# Patient Record
Sex: Female | Born: 1965 | Race: White | Hispanic: No | Smoking: Never smoker
Health system: Southern US, Community
[De-identification: ages and names within clinical notes are randomized; demographics above are authoritative.]

## PROBLEM LIST (undated history)

## (undated) DIAGNOSIS — Z5189 Encounter for other specified aftercare: Secondary | ICD-10-CM

## (undated) DIAGNOSIS — I10 Essential (primary) hypertension: Secondary | ICD-10-CM

## (undated) DIAGNOSIS — R17 Unspecified jaundice: Secondary | ICD-10-CM

## (undated) DIAGNOSIS — G43909 Migraine, unspecified, not intractable, without status migrainosus: Secondary | ICD-10-CM

## (undated) DIAGNOSIS — F32A Depression, unspecified: Secondary | ICD-10-CM

## (undated) DIAGNOSIS — N390 Urinary tract infection, site not specified: Secondary | ICD-10-CM

## (undated) DIAGNOSIS — IMO0001 Reserved for inherently not codable concepts without codable children: Secondary | ICD-10-CM

## (undated) DIAGNOSIS — F329 Major depressive disorder, single episode, unspecified: Secondary | ICD-10-CM

## (undated) DIAGNOSIS — K219 Gastro-esophageal reflux disease without esophagitis: Secondary | ICD-10-CM

## (undated) DIAGNOSIS — N83209 Unspecified ovarian cyst, unspecified side: Secondary | ICD-10-CM

## (undated) DIAGNOSIS — B159 Hepatitis A without hepatic coma: Secondary | ICD-10-CM

## (undated) HISTORY — DX: Reserved for inherently not codable concepts without codable children: IMO0001

## (undated) HISTORY — DX: Major depressive disorder, single episode, unspecified: F32.9

## (undated) HISTORY — DX: Encounter for other specified aftercare: Z51.89

## (undated) HISTORY — DX: Hepatitis a without hepatic coma: B15.9

## (undated) HISTORY — DX: Essential (primary) hypertension: I10

## (undated) HISTORY — DX: Unspecified jaundice: R17

## (undated) HISTORY — PX: TONSILLECTOMY AND ADENOIDECTOMY: SHX28

## (undated) HISTORY — DX: Depression, unspecified: F32.A

## (undated) HISTORY — DX: Migraine, unspecified, not intractable, without status migrainosus: G43.909

## (undated) HISTORY — PX: OVARIAN CYST REMOVAL: SHX89

## (undated) HISTORY — DX: Urinary tract infection, site not specified: N39.0

## (undated) HISTORY — DX: Gastro-esophageal reflux disease without esophagitis: K21.9

---

## 1992-11-04 HISTORY — PX: APPENDECTOMY: SHX54

## 1997-12-05 ENCOUNTER — Inpatient Hospital Stay (HOSPITAL_COMMUNITY): Admission: AD | Admit: 1997-12-05 | Discharge: 1997-12-07 | Payer: Self-pay | Admitting: Obstetrics and Gynecology

## 1998-07-18 ENCOUNTER — Other Ambulatory Visit: Admission: RE | Admit: 1998-07-18 | Discharge: 1998-07-18 | Payer: Self-pay | Admitting: Obstetrics and Gynecology

## 1998-12-26 ENCOUNTER — Emergency Department (HOSPITAL_COMMUNITY): Admission: EM | Admit: 1998-12-26 | Discharge: 1998-12-26 | Payer: Self-pay | Admitting: Family Medicine

## 1999-07-14 ENCOUNTER — Emergency Department (HOSPITAL_COMMUNITY): Admission: EM | Admit: 1999-07-14 | Discharge: 1999-07-14 | Payer: Self-pay | Admitting: Emergency Medicine

## 1999-07-14 ENCOUNTER — Encounter: Payer: Self-pay | Admitting: Emergency Medicine

## 1999-08-29 ENCOUNTER — Other Ambulatory Visit: Admission: RE | Admit: 1999-08-29 | Discharge: 1999-08-29 | Payer: Self-pay | Admitting: Obstetrics and Gynecology

## 1999-12-26 ENCOUNTER — Inpatient Hospital Stay (HOSPITAL_COMMUNITY): Admission: AD | Admit: 1999-12-26 | Discharge: 1999-12-26 | Payer: Self-pay | Admitting: Obstetrics and Gynecology

## 2000-01-26 ENCOUNTER — Inpatient Hospital Stay (HOSPITAL_COMMUNITY): Admission: EM | Admit: 2000-01-26 | Discharge: 2000-01-27 | Payer: Self-pay

## 2000-03-06 ENCOUNTER — Inpatient Hospital Stay (HOSPITAL_COMMUNITY): Admission: AD | Admit: 2000-03-06 | Discharge: 2000-03-08 | Payer: Self-pay | Admitting: Obstetrics and Gynecology

## 2000-03-21 ENCOUNTER — Inpatient Hospital Stay (HOSPITAL_COMMUNITY): Admission: EM | Admit: 2000-03-21 | Discharge: 2000-03-21 | Payer: Self-pay | Admitting: Obstetrics and Gynecology

## 2000-04-21 ENCOUNTER — Other Ambulatory Visit: Admission: RE | Admit: 2000-04-21 | Discharge: 2000-04-21 | Payer: Self-pay | Admitting: Obstetrics and Gynecology

## 2000-12-16 ENCOUNTER — Inpatient Hospital Stay (HOSPITAL_COMMUNITY): Admission: EM | Admit: 2000-12-16 | Discharge: 2000-12-19 | Payer: Self-pay | Admitting: Internal Medicine

## 2001-08-16 ENCOUNTER — Inpatient Hospital Stay (HOSPITAL_COMMUNITY): Admission: EM | Admit: 2001-08-16 | Discharge: 2001-08-17 | Payer: Self-pay | Admitting: *Deleted

## 2001-12-05 ENCOUNTER — Emergency Department (HOSPITAL_COMMUNITY): Admission: EM | Admit: 2001-12-05 | Discharge: 2001-12-05 | Payer: Self-pay | Admitting: Emergency Medicine

## 2002-03-13 ENCOUNTER — Emergency Department (HOSPITAL_COMMUNITY): Admission: EM | Admit: 2002-03-13 | Discharge: 2002-03-13 | Payer: Self-pay | Admitting: Emergency Medicine

## 2002-12-02 ENCOUNTER — Other Ambulatory Visit (HOSPITAL_COMMUNITY): Admission: RE | Admit: 2002-12-02 | Discharge: 2003-01-03 | Payer: Self-pay | Admitting: Psychiatry

## 2003-04-22 ENCOUNTER — Emergency Department (HOSPITAL_COMMUNITY): Admission: EM | Admit: 2003-04-22 | Discharge: 2003-04-22 | Payer: Self-pay | Admitting: Emergency Medicine

## 2004-05-04 ENCOUNTER — Emergency Department (HOSPITAL_COMMUNITY): Admission: EM | Admit: 2004-05-04 | Discharge: 2004-05-04 | Payer: Self-pay | Admitting: Emergency Medicine

## 2004-06-18 ENCOUNTER — Emergency Department (HOSPITAL_COMMUNITY): Admission: EM | Admit: 2004-06-18 | Discharge: 2004-06-18 | Payer: Self-pay | Admitting: Emergency Medicine

## 2004-08-11 ENCOUNTER — Emergency Department (HOSPITAL_COMMUNITY): Admission: EM | Admit: 2004-08-11 | Discharge: 2004-08-11 | Payer: Self-pay | Admitting: Emergency Medicine

## 2005-06-14 ENCOUNTER — Emergency Department (HOSPITAL_COMMUNITY): Admission: EM | Admit: 2005-06-14 | Discharge: 2005-06-14 | Payer: Self-pay | Admitting: Emergency Medicine

## 2006-09-30 ENCOUNTER — Ambulatory Visit: Payer: Self-pay | Admitting: Internal Medicine

## 2008-09-04 ENCOUNTER — Emergency Department (HOSPITAL_COMMUNITY): Admission: EM | Admit: 2008-09-04 | Discharge: 2008-09-04 | Payer: Self-pay | Admitting: Emergency Medicine

## 2008-09-10 ENCOUNTER — Emergency Department (HOSPITAL_COMMUNITY): Admission: EM | Admit: 2008-09-10 | Discharge: 2008-09-10 | Payer: Self-pay | Admitting: Emergency Medicine

## 2009-01-13 ENCOUNTER — Emergency Department (HOSPITAL_COMMUNITY): Admission: EM | Admit: 2009-01-13 | Discharge: 2009-01-13 | Payer: Self-pay | Admitting: Emergency Medicine

## 2009-03-29 ENCOUNTER — Emergency Department (HOSPITAL_COMMUNITY): Admission: EM | Admit: 2009-03-29 | Discharge: 2009-03-30 | Payer: Self-pay | Admitting: Emergency Medicine

## 2009-05-03 ENCOUNTER — Emergency Department (HOSPITAL_COMMUNITY): Admission: EM | Admit: 2009-05-03 | Discharge: 2009-05-03 | Payer: Self-pay | Admitting: Emergency Medicine

## 2009-05-25 ENCOUNTER — Ambulatory Visit: Payer: Self-pay | Admitting: Family Medicine

## 2009-05-25 DIAGNOSIS — I10 Essential (primary) hypertension: Secondary | ICD-10-CM | POA: Insufficient documentation

## 2009-05-25 DIAGNOSIS — F4323 Adjustment disorder with mixed anxiety and depressed mood: Secondary | ICD-10-CM | POA: Insufficient documentation

## 2009-05-25 DIAGNOSIS — J45909 Unspecified asthma, uncomplicated: Secondary | ICD-10-CM | POA: Insufficient documentation

## 2009-05-25 DIAGNOSIS — K219 Gastro-esophageal reflux disease without esophagitis: Secondary | ICD-10-CM | POA: Insufficient documentation

## 2009-06-24 ENCOUNTER — Telehealth: Payer: Self-pay | Admitting: Family Medicine

## 2009-07-19 ENCOUNTER — Emergency Department (HOSPITAL_COMMUNITY): Admission: EM | Admit: 2009-07-19 | Discharge: 2009-07-19 | Payer: Self-pay | Admitting: Emergency Medicine

## 2009-08-11 ENCOUNTER — Ambulatory Visit: Payer: Self-pay | Admitting: Internal Medicine

## 2009-09-14 ENCOUNTER — Emergency Department (HOSPITAL_COMMUNITY): Admission: EM | Admit: 2009-09-14 | Discharge: 2009-09-14 | Payer: Self-pay | Admitting: Emergency Medicine

## 2009-09-17 ENCOUNTER — Emergency Department (HOSPITAL_COMMUNITY): Admission: EM | Admit: 2009-09-17 | Discharge: 2009-09-18 | Payer: Self-pay | Admitting: Emergency Medicine

## 2009-10-05 ENCOUNTER — Ambulatory Visit: Payer: Self-pay | Admitting: Internal Medicine

## 2009-10-23 ENCOUNTER — Ambulatory Visit: Payer: Self-pay | Admitting: Family Medicine

## 2009-10-23 DIAGNOSIS — F411 Generalized anxiety disorder: Secondary | ICD-10-CM | POA: Insufficient documentation

## 2009-12-14 ENCOUNTER — Ambulatory Visit: Payer: Self-pay | Admitting: Family Medicine

## 2009-12-14 DIAGNOSIS — F988 Other specified behavioral and emotional disorders with onset usually occurring in childhood and adolescence: Secondary | ICD-10-CM | POA: Insufficient documentation

## 2010-01-03 ENCOUNTER — Telehealth: Payer: Self-pay | Admitting: Family Medicine

## 2010-01-29 ENCOUNTER — Ambulatory Visit: Payer: Self-pay | Admitting: Family Medicine

## 2010-01-29 DIAGNOSIS — J309 Allergic rhinitis, unspecified: Secondary | ICD-10-CM | POA: Insufficient documentation

## 2010-02-08 ENCOUNTER — Telehealth: Payer: Self-pay | Admitting: Family Medicine

## 2010-02-13 ENCOUNTER — Encounter: Payer: Self-pay | Admitting: Internal Medicine

## 2010-02-21 ENCOUNTER — Telehealth: Payer: Self-pay | Admitting: *Deleted

## 2010-02-26 ENCOUNTER — Telehealth: Payer: Self-pay | Admitting: Family Medicine

## 2010-04-03 ENCOUNTER — Telehealth: Payer: Self-pay | Admitting: Family Medicine

## 2010-04-05 ENCOUNTER — Ambulatory Visit: Payer: Self-pay | Admitting: Internal Medicine

## 2010-04-05 ENCOUNTER — Inpatient Hospital Stay (HOSPITAL_COMMUNITY): Admission: EM | Admit: 2010-04-05 | Discharge: 2010-04-09 | Payer: Self-pay | Admitting: Emergency Medicine

## 2010-04-05 ENCOUNTER — Encounter: Payer: Self-pay | Admitting: *Deleted

## 2010-04-06 ENCOUNTER — Telehealth: Payer: Self-pay | Admitting: Family Medicine

## 2010-04-09 ENCOUNTER — Telehealth: Payer: Self-pay | Admitting: Internal Medicine

## 2010-04-18 ENCOUNTER — Ambulatory Visit: Payer: Self-pay | Admitting: Family Medicine

## 2010-04-23 ENCOUNTER — Telehealth: Payer: Self-pay | Admitting: Family Medicine

## 2010-04-24 ENCOUNTER — Telehealth: Payer: Self-pay | Admitting: Family Medicine

## 2010-04-24 ENCOUNTER — Telehealth: Payer: Self-pay | Admitting: Internal Medicine

## 2010-05-25 ENCOUNTER — Telehealth: Payer: Self-pay | Admitting: Family Medicine

## 2010-06-03 ENCOUNTER — Emergency Department (HOSPITAL_COMMUNITY): Admission: EM | Admit: 2010-06-03 | Discharge: 2010-06-03 | Payer: Self-pay | Admitting: Emergency Medicine

## 2010-06-21 ENCOUNTER — Telehealth: Payer: Self-pay | Admitting: Family Medicine

## 2010-07-18 ENCOUNTER — Telehealth: Payer: Self-pay | Admitting: Family Medicine

## 2010-07-30 ENCOUNTER — Telehealth: Payer: Self-pay | Admitting: Family Medicine

## 2010-08-08 ENCOUNTER — Telehealth: Payer: Self-pay | Admitting: Family Medicine

## 2010-08-09 ENCOUNTER — Telehealth: Payer: Self-pay | Admitting: Family Medicine

## 2010-08-27 ENCOUNTER — Telehealth: Payer: Self-pay | Admitting: Family Medicine

## 2010-09-07 ENCOUNTER — Telehealth: Payer: Self-pay | Admitting: Family Medicine

## 2010-09-26 ENCOUNTER — Telehealth: Payer: Self-pay | Admitting: Family Medicine

## 2010-10-08 ENCOUNTER — Telehealth: Payer: Self-pay | Admitting: Family Medicine

## 2010-10-23 ENCOUNTER — Telehealth: Payer: Self-pay | Admitting: Family Medicine

## 2010-10-30 ENCOUNTER — Ambulatory Visit
Admission: RE | Admit: 2010-10-30 | Discharge: 2010-10-30 | Payer: Self-pay | Source: Home / Self Care | Attending: Internal Medicine | Admitting: Internal Medicine

## 2010-10-31 ENCOUNTER — Ambulatory Visit: Admit: 2010-10-31 | Payer: Self-pay | Admitting: Family Medicine

## 2010-11-08 ENCOUNTER — Telehealth: Payer: Self-pay | Admitting: Family Medicine

## 2010-11-26 ENCOUNTER — Telehealth: Payer: Self-pay | Admitting: Family Medicine

## 2010-11-29 ENCOUNTER — Telehealth: Payer: Self-pay | Admitting: Family Medicine

## 2010-12-05 ENCOUNTER — Emergency Department (HOSPITAL_COMMUNITY)
Admission: EM | Admit: 2010-12-05 | Discharge: 2010-12-05 | Disposition: A | Discharge status: Home / Self Care | Payer: 59 | Attending: Emergency Medicine | Admitting: Emergency Medicine

## 2010-12-05 DIAGNOSIS — F988 Other specified behavioral and emotional disorders with onset usually occurring in childhood and adolescence: Secondary | ICD-10-CM | POA: Insufficient documentation

## 2010-12-05 DIAGNOSIS — I10 Essential (primary) hypertension: Secondary | ICD-10-CM | POA: Insufficient documentation

## 2010-12-05 DIAGNOSIS — J3489 Other specified disorders of nose and nasal sinuses: Secondary | ICD-10-CM | POA: Insufficient documentation

## 2010-12-05 DIAGNOSIS — J45909 Unspecified asthma, uncomplicated: Secondary | ICD-10-CM | POA: Insufficient documentation

## 2010-12-05 DIAGNOSIS — R040 Epistaxis: Secondary | ICD-10-CM | POA: Insufficient documentation

## 2010-12-06 NOTE — Progress Notes (Signed)
Summary: Pt req reg Adderall 10mg  (NOT XR)  Phone Note Call from Patient Call back at [facility] Phone [phone]   Caller: Patient Complaint: Urinary/GYN Problems Summary of Call: Pt called re: Adderall 10mg   (NOT XR). Pt feels like she needs this now. Please call when script ready for pick up.  Initial call taken by: [name],  February 26, 2010 2:32 PM  Follow-up for Phone Call        pt informed script is ready Follow-up by: Willy Eddy, LPN,  February 27, 2010 7:57 AM    Prescriptions: ADDERALL 10 MG TABS (AMPHETAMINE-DEXTROAMPHETAMINE) 1 once daily IN PM  #30 x 0   Entered by:   Willy Eddy, LPN   Authorized by:   Evelena Peat MD   Signed by:   Willy Eddy, LPN on 91/47/8295   Method used:   Print then Give to Patient   RxID:   [id number] ADDERALL 10 MG TABS (AMPHETAMINE-DEXTROAMPHETAMINE) 1 once daily IN PM  #30 x 0   Entered by:   Willy Eddy, LPN   Authorized by:   Evelena Peat MD   Signed by:   Willy Eddy, LPN on 52/84/1324   Method used:   Print then Give to Patient   RxID:   [id number]

## 2010-12-06 NOTE — Progress Notes (Signed)
Summary: refill  Phone Note Call from Patient Call back at [phone]   Caller: Patient Call For: Franciszek Platten Reason for Call: Talk to Nurse Summary of Call: Patient being d/c from hospital and wanted to make sure rx for [facility]anax [facility] [phone].  Patient stated she spoke w/ dr Rosaire Cueto about this. Initial call taken by: Lehman Prom,  April 09, 2010 12:56 PM  Follow-up for Phone Call        Dr. Maple Hudson, I called the pharmacy and they have not recieved rx for xanax.  It looks like Dr Caryl Never normally refills this for pt.  Please advise if we need to send in rx thanks Follow-up by: Vernie Murders,  April 09, 2010 2:23 PM  Additional Follow-up for Phone Call Additional follow up Details #1::        per CY pt needs to get filled from Dr. Caryl Never. Pt advised. Carron Curie CMA  April 09, 2010 4:56 PM

## 2010-12-06 NOTE — Progress Notes (Signed)
Summary: Pt req script for Adderall XR 25mg   Phone Note Refill Request Call back at Home Phone [phone] Message from:  Patient on November 08, 2010 11:54 AM  Refills Requested: Medication #1:  ADDERALL XR 25 MG XR24H-CAP once daily may fill in one month   Dosage confirmed as above?Dosage Confirmed   Supply Requested: 1 month  Method Requested: Pick up at Office Initial call taken by: [name],  November 08, 2010 11:54 AM  Follow-up for Phone Call        Pt informed ready for pick-up Follow-up by: [name] LPN,  November 08, 2010 5:13 PM    New/Updated Medications: ADDERALL XR 25 MG XR24H-CAP (AMPHETAMINE-DEXTROAMPHETAMINE) one by mouth once daily ADDERALL XR 25 MG XR24H-CAP (AMPHETAMINE-DEXTROAMPHETAMINE) one by mouth once daily may refill in two months Prescriptions: ADDERALL XR 25 MG XR24H-CAP (AMPHETAMINE-DEXTROAMPHETAMINE) one by mouth once daily may refill in two months  #30 x 0   Entered and Authorized by:   Evelena Peat MD   Signed by:   Evelena Peat MD on 11/08/2010   Method used:   Print then Give to Patient   RxID:   [id number] ADDERALL XR 25 MG XR24H-CAP (AMPHETAMINE-DEXTROAMPHETAMINE) one by mouth once daily  #30 x 0   Entered and Authorized by:   Evelena Peat MD   Signed by:   Evelena Peat MD on 11/08/2010   Method used:   Print then Give to Patient   RxID:   [id number] ADDERALL XR 25 MG XR24H-CAP (AMPHETAMINE-DEXTROAMPHETAMINE) once daily may fill in one month  #30 x 0   Entered and Authorized by:   Evelena Peat MD   Signed by:   Evelena Peat MD on 11/08/2010   Method used:   Print then Give to Patient   RxID:   [id number]

## 2010-12-06 NOTE — Progress Notes (Signed)
Summary: Pt req refill of Adderall 10mg   Phone Note Refill Request Call back at Home Phone [phone] Message from:  Patient on September 26, 2010 8:31 AM  Refills Requested: Medication #1:  ADDERALL 10 MG TABS 1 once daily IN PM.   Dosage confirmed as above?Dosage Confirmed   Supply Requested: 1 month  Method Requested: Pick up at Office Initial call taken by: [name],  September 26, 2010 8:31 AM  Follow-up for Phone Call        Adderall 10mg , take one in PM last filled 08/29/10 [name] LPN  September 26, 2010 8:36 AM will refill Follow-up by: Evelena Peat MD,  October 01, 2010 8:30 AM  Additional Follow-up for Phone Call Additional follow up Details #1::        Pt has OV today Additional Follow-up by: [name] LPN,  October 01, 2010 9:10 AM    Prescriptions: ADDERALL 10 MG TABS (AMPHETAMINE-DEXTROAMPHETAMINE) 1 once daily IN PM  #30 x 0   Entered and Authorized by:   Evelena Peat MD   Signed by:   Evelena Peat MD on 10/01/2010   Method used:   Print then Give to Patient   RxID:   [id number]

## 2010-12-06 NOTE — Progress Notes (Signed)
Summary: REFILL REQUEST Adderall XR 25mg   Phone Note Refill Request Message from:  Patient on October 08, 2010 8:17 AM  Refills Requested: Medication #1:  ADDERALL XR 25 MG XR24H-CAP one  tab by mouth daily   Notes: Pt can be reached at [phone] when Rx is ready for p/u.    Initial call taken by: Debbra Riding,  October 08, 2010 8:17 AM  Follow-up for Phone Call        Last filled 11/4, no show 11/28 refill Follow-up by: Evelena Peat MD,  October 09, 2010 12:57 PM  Additional Follow-up for Phone Call Additional follow up Details #1::        Pt informed Rx ready Additional Follow-up by: [name] LPN,  October 09, 2010 2:20 PM    Prescriptions: ADDERALL XR 25 MG XR24H-CAP (AMPHETAMINE-DEXTROAMPHETAMINE) one  tab by mouth daily  #30 x 0   Entered and Authorized by:   Evelena Peat MD   Signed by:   Evelena Peat MD on 10/09/2010   Method used:   Print then Give to Patient   RxID:   [id number]

## 2010-12-06 NOTE — Progress Notes (Signed)
Summary: refill Adderall request, last filled 9/26 #30 only  Phone Note Refill Request Call back at Home Phone [phone] Message from:  Patient---live call  Refills Requested: Medication #1:  ADDERALL 10 MG TABS 1 once daily IN PM. call when ready  Initial call taken by: Warnell Forester,  August 27, 2010 9:02 AM  Follow-up for Phone Call        Last filled 9/26, #30 only [name] LPN  August 27, 2010 12:11 PM will refill  Follow-up by: Evelena Peat MD,  August 29, 2010 1:04 PM  Additional Follow-up for Phone Call Additional follow up Details #1::        Pt informed ready Additional Follow-up by: [name] LPN,  August 29, 2010 1:30 PM    Prescriptions: ADDERALL 10 MG TABS (AMPHETAMINE-DEXTROAMPHETAMINE) 1 once daily IN PM  #30 x 0   Entered and Authorized by:   Evelena Peat MD   Signed by:   Evelena Peat MD on 08/29/2010   Method used:   Print then Give to Patient   RxID:   [id number]

## 2010-12-06 NOTE — Progress Notes (Signed)
Summary: Adderall Refill  Phone Note Refill Request Call back at Home Phone [phone] Message from:  Patient on June 21, 2010 10:22 AM  Refills Requested: Medication #1:  ADDERALL 10 MG TABS 1 once daily IN PM. Due for refill on 06/26/10, was told to call a few days early before running out of medicine.  Initial call taken by: Trixie Dredge,  June 21, 2010 10:21 AM  Follow-up for Phone Call        will refill Follow-up by: Evelena Peat MD,  June 23, 2010 11:55 AM  Additional Follow-up for Phone Call Additional follow up Details #1::        Pt informed on personally identified VM Additional Follow-up by: [name] LPN,  June 25, 2010 8:14 AM    Prescriptions: ADDERALL 10 MG TABS (AMPHETAMINE-DEXTROAMPHETAMINE) 1 once daily IN PM  #30 x 0   Entered and Authorized by:   Evelena Peat MD   Signed by:   Evelena Peat MD on 06/23/2010   Method used:   Print then Give to Patient   RxID:   [id number]

## 2010-12-06 NOTE — Progress Notes (Signed)
Summary: after hours albuterol refill  Phone Note Call from Patient   Caller: Patient Summary of Call: pt reports she is out of albuterol.  [facility].  [phone]. Initial call taken by: Neena Rhymes MD,  June 24, 2009 9:51 AM    Prescriptions: VENTOLIN HFA 108 (90 BASE) MCG/ACT AERS (ALBUTEROL SULFATE) as needed  #1 x 3   Entered and Authorized by:   Neena Rhymes MD   Signed by:   Neena Rhymes MD on 06/24/2009   Method used:   Electronically to        [facility]. [phone]* (retail)       [phone]       [name], [place]  [address]       Ph: [phone]       Fax: [phone]   RxID:   [id number]

## 2010-12-06 NOTE — Progress Notes (Signed)
Summary: Pt hospitalized yesterday, asthma complications  Phone Note Call from Patient   Caller: Patient Call For: Evelena Peat MD Summary of Call: Pt called from Hospital, her asthma condition worsened yesterday and went to ER.  She was admitted and was thold she would be there a few days.  Tried to call yesterday to inform office, called around 5:30pm, no answer.  Apologized for no-show.  Pt will schedule hospital follow-up visit for next week. Initial call taken by: [name] LPN,  April 06, 1609 10:52 AM

## 2010-12-06 NOTE — Miscellaneous (Signed)
Summary: Orders Update pft charges  Clinical Lists Changes  Orders: Added new Service order of Carbon Monoxide diffusing w/capacity (94720) - Signed Added new Service order of Lung Volumes (94240) - Signed Added new Service order of Spirometry (Pre & Post) (94060) - Signed 

## 2010-12-06 NOTE — Assessment & Plan Note (Signed)
Summary: follow up re: asthma and lung inf/cjr   Vital Signs:  Patient profile:   45 year old female Menstrual status:  regular Temp:     98.2 degrees F oral BP sitting:   140 / 90  (left arm) Cuff size:   regular  Vitals Entered By: [name] LPN (April 18, 2010 8:57 AM)  CC: Post hospital visit, asthma   History of Present Illness: Recent hospitalization for asthma exacerbation and viral illness.  No pneumonia. Pt treated with antibiotics, IV steroids, and subsequent pred oral taper. Doing better at this time-at baseline from resp standpoint.  Labs from hosp reviewed and signif for glucose of 139 (?fasting).  No symptoms of hyperglycemia. meds changed from Asmanex to Symbicort.  Pt remains on Spiriva.    Increased anxiety isssues with daughter going to [place] tomorrow.  Requests refills Xanax. On Prozac for hx of depression.   Depression symptoms stable.  D/C Prozac dose listed at 10 mg  but pt should be on 20 mg dose.  needs refills Nexium.  No GERD symptoms on Nexium.  Allergies: 1)  ! Aspirin (Aspirin) 2)  Codeine Sulfate (Codeine Sulfate) 3)  Hydrocodone-Acetaminophen (Hydrocodone-Acetaminophen)  Past History:  Past Medical History: Last updated: 05/25/2009 Depression Hypertension Asthma GERD Hepatitis A Jaundice Blood transfusion Migraines UTI  Review of Systems  The patient denies fever, hoarseness, chest pain, dyspnea on exertion, peripheral edema, prolonged cough, and hemoptysis.    Physical Exam  General:  Well-developed,well-nourished,in no acute distress; alert,appropriate and cooperative throughout examination Ears:  External ear exam shows no significant lesions or deformities.  Otoscopic examination reveals clear canals, tympanic membranes are intact bilaterally without bulging, retraction, inflammation or discharge. Hearing is grossly normal bilaterally. Mouth:  Oral mucosa and oropharynx without lesions or exudates.  Teeth in good  repair. Neck:  No deformities, masses, or tenderness noted. Lungs:  Normal respiratory effort, chest expands symmetrically. Lungs are clear to auscultation, no crackles or wheezes. Heart:  normal rate and regular rhythm.   Extremities:  no edema. Skin:  no rashes.   Cervical Nodes:  No lymphadenopathy noted Psych:  normally interactive, good eye contact, not anxious appearing, and not depressed appearing.     Impression & Recommendations:  Problem # 1:  ASTHMA (ICD-493.90)  Her updated medication list for this problem includes:    Ventolin Hfa 108 (90 Base) Mcg/act Aers (Albuterol sulfate) .Marland Kitchen... As needed    Symbicort 160-4.5 Mcg/act Aero (Budesonide-formoterol fumarate) ..... One puff every morning    Spiriva Handihaler 18 Mcg Caps (Tiotropium bromide monohydrate) .Marland Kitchen... 1 daily    Ipratropium-albuterol 0.5-2.5 (3) Mg/72ml Soln (Ipratropium-albuterol) ..... Inhale contents of 1 viall in nebulizer as needed for asthma  Problem # 2:  GERD (ICD-530.81)  Her updated medication list for this problem includes:    Nexium 40 Mg Cpdr (Esomeprazole magnesium) ..... One by mouth once daily  Problem # 3:  ANXIETY (ICD-300.00)  Her updated medication list for this problem includes:    Fluoxetine Hcl 20 Mg Caps (Fluoxetine hcl) ..... One by mouth once daily    Alprazolam 0.5 Mg Tabs (Alprazolam) ..... One by mouth q 8 hours as needed  Problem # 4:  HYPERTENSION (ICD-401.9)  Her updated medication list for this problem includes:    Hydrochlorothiazide 25 Mg Tabs (Hydrochlorothiazide) ..... One half tablet once daily    Benicar 20 Mg Tabs (Olmesartan medoxomil) ..... One by mouth once daily  Complete Medication List: 1)  Ventolin Hfa 108 (90 Base) Mcg/act Aers (  Albuterol sulfate) .... As needed 2)  Hydrochlorothiazide 25 Mg Tabs (Hydrochlorothiazide) .... One half tablet once daily 3)  Symbicort 160-4.5 Mcg/act Aero (Budesonide-formoterol fumarate) .... One puff every morning 4)  Spiriva  Handihaler 18 Mcg Caps (Tiotropium bromide monohydrate) .Marland Kitchen.. 1 daily 5)  Nexium 40 Mg Cpdr (Esomeprazole magnesium) .... One by mouth once daily 6)  Fluoxetine Hcl 20 Mg Caps (Fluoxetine hcl) .... One by mouth once daily 7)  Alprazolam 0.5 Mg Tabs (Alprazolam) .... One by mouth q 8 hours as needed 8)  Benicar 20 Mg Tabs (Olmesartan medoxomil) .... One by mouth once daily 9)  Adderall Xr 25 Mg Xr24h-cap (Amphetamine-dextroamphetamine) .... One by mouth once daily 10)  Ipratropium-albuterol 0.5-2.5 (3) Mg/90ml Soln (Ipratropium-albuterol) .... Inhale contents of 1 viall in nebulizer as needed for asthma 11)  Astelin 137 Mcg/spray Soln (Azelastine hcl) .Marland Kitchen.. 1-2 sprays per nostril two times a day as needed allergies 12)  Adderall Xr 25 Mg Xr24h-cap (Amphetamine-dextroamphetamine) .... On tab by mouth daily may fill in one month 13)  Adderall Xr 25 Mg Xr24h-cap (Amphetamine-dextroamphetamine) .... One tab by mouth daily may fill in two months 14)  Adderall 10 Mg Tabs (Amphetamine-dextroamphetamine) .Marland Kitchen.. 1 once daily in pm  Patient Instructions: 1)  Increase Fluoxetine to 20 mg per day. 2)  Make sure Benicar dose is 20 mg daily. 3)  Check your  Blood Pressure regularly . If it is above:140/90   you should make an appointment. Prescriptions: NEXIUM 40 MG CPDR (ESOMEPRAZOLE MAGNESIUM) one by mouth once daily  #30 x 6   Entered and Authorized by:   Evelena Peat MD   Signed by:   Evelena Peat MD on 04/18/2010   Method used:   Electronically to        [facility]. [phone]* (retail)       [phone] [address].       [place], [place]  [address]       Ph: [phone]       Fax: [phone]   RxID:   [id number] FLUOXETINE HCL 20 MG CAPS (FLUOXETINE HCL) one by mouth once daily  #30 x 6   Entered and Authorized by:   Evelena Peat MD   Signed by:   Evelena Peat MD on 04/18/2010   Method used:   Electronically to        [facility]. [phone]*  (retail)       [phone] [address].       [place], [place]  [address]       Ph: [phone]       Fax: [phone]   RxID:   [id number] ALPRAZOLAM 0.5 MG TABS (ALPRAZOLAM) one by mouth q 8 hours as needed  #60 x 0   Entered and Authorized by:   Evelena Peat MD   Signed by:   Evelena Peat MD on 04/18/2010   Method used:   Print then Give to Patient   RxID:   [id number]

## 2010-12-06 NOTE — Assessment & Plan Note (Signed)
Summary: asthma problems/cjr   Vital Signs:  Patient profile:   45 year old female Menstrual status:  regular Weight:      150 pounds BMI:     25.05 O2 Sat:      96 % Pulse rate:   81 / minute BP sitting:   150 / 98  (left arm)  Vitals Entered By: Kyung Rudd, CMA (October 30, 2010 11:59 AM) CC: asthma issues    Primary Care Provider:  [facility]  CC:  asthma issues .  History of Present Illness: Patient presents to clinic as a workin for evaluation of cough. Notes 7d h/o cough NP with subjective wheezing. Denies f/c, cp or dyspnea. H/o asthma and states recently began prednisone taper (has at home.) H/o ADD on adderall and feels less focus and lethargy. States previously on higher dose without adverse effect and requests increase.  Current Medications (verified): 1)  Ventolin Hfa 108 (90 Base) Mcg/act Aers (Albuterol Sulfate) .... As Needed 2)  Hydrochlorothiazide 25 Mg Tabs (Hydrochlorothiazide) .... One Half Tablet Once Daily 3)  Symbicort 160-4.5 Mcg/act Aero (Budesonide-Formoterol Fumarate) .... One Puff Every Morning 4)  Spiriva Handihaler 18 Mcg Caps (Tiotropium Bromide Monohydrate) .Marland Kitchen.. 1 Daily 5)  Omeprazole 20 Mg Tbec (Omeprazole) .... One Tab  Daily 6)  Fluoxetine Hcl 20 Mg Caps (Fluoxetine Hcl) .... One By Mouth Once Daily 7)  Alprazolam 0.5 Mg Tabs (Alprazolam) .... One By Mouth Q 8 Hours As Needed 8)  Benicar 20 Mg Tabs (Olmesartan Medoxomil) .... One By Mouth Once Daily 9)  Ipratropium-Albuterol 0.5-2.5 (3) Mg/9ml Soln (Ipratropium-Albuterol) .... Inhale Contents of 1 Viall in Nebulizer As Needed For Asthma 10)  Astelin 137 Mcg/spray Soln (Azelastine Hcl) .Marland Kitchen.. 1-2 Sprays Per Nostril Two Times A Day As Needed Allergies 11)  Adderall Xr 25 Mg Xr24h-Cap (Amphetamine-Dextroamphetamine) .... Once Daily May Fill in One Month  Allergies (verified): 1)  ! Aspirin (Aspirin) 2)  Codeine Sulfate (Codeine Sulfate) 3)  Hydrocodone-Acetaminophen  (Hydrocodone-Acetaminophen)  Past History:  Past medical, surgical, family and social histories (including risk factors) reviewed, and no changes noted (except as noted below).  Past Medical History: Reviewed history from 05/25/2009 and no changes required. Depression Hypertension Asthma GERD Hepatitis A Jaundice Blood transfusion Migraines UTI  Past Surgical History: Reviewed history from 05/25/2009 and no changes required. Appendectomy 1994 Tonsillectomy  1973  Family History: Reviewed history from 08/11/2009 and no changes required. Family History of Alcoholism/Addiction, parent, blood relative Family History Ovarian cancer, grandmother Family history breast cancer, grandmother Family History Hypertension Family history emotional illness Family history diabetes, blood relative Son with asthma and nut allergy  Social History: Reviewed history from 08/11/2009 and no changes required. 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   Review of Systems      See HPI  Physical Exam  General:  Well-developed,well-nourished,in no acute distress; alert,appropriate and cooperative throughout examination Head:  Normocephalic and atraumatic without obvious abnormalities. No apparent alopecia or balding. Eyes:  pupils equal, pupils round, corneas and lenses clear, and no injection.   Ears:  R ear normal, L ear normal, and no external deformities.   Nose:  no external deformity, no nasal discharge, and no mucosal edema.   Mouth:  Oral mucosa and oropharynx without lesions or exudates.  Teeth in good repair. Neck:  No deformities, masses, or tenderness noted. Lungs:  Normal respiratory effort, chest expands symmetrically. Lungs are clear to auscultation, no crackles or wheezes.no intercostal retractions and no accessory muscle  use.   Heart:  Normal rate and regular rhythm. S1 and S2 normal without gallop, murmur, click, rub or other extra  sounds. Skin:  turgor normal, color normal, and no rashes.     Impression & Recommendations:  Problem # 1:  ASTHMA (ICD-493.90) Assessment Deteriorated Asthma exacerbation without resp distress.  Suspect URI trigger. Begin abx tx, prednisone taper(instructions provided). Followup if no improvement or worsening.  Her updated medication list for this problem includes:    Ventolin Hfa 108 (90 Base) Mcg/act Aers (Albuterol sulfate) .Marland Kitchen... As needed    Symbicort 160-4.5 Mcg/act Aero (Budesonide-formoterol fumarate) ..... One puff every morning    Spiriva Handihaler 18 Mcg Caps (Tiotropium bromide monohydrate) .Marland Kitchen... 1 daily    Ipratropium-albuterol 0.5-2.5 (3) Mg/54ml Soln (Ipratropium-albuterol) ..... 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 Further dose adjustments per pmd and f/u appt made to review.  Complete Medication List: 1)  Ventolin Hfa 108 (90 Base) Mcg/act Aers (Albuterol sulfate) .... As needed 2)  Hydrochlorothiazide 25 Mg Tabs (Hydrochlorothiazide) .... One half tablet once daily 3)  Symbicort 160-4.5 Mcg/act Aero (Budesonide-formoterol fumarate) .... One puff every morning 4)  Spiriva Handihaler 18 Mcg Caps (Tiotropium bromide monohydrate) .Marland Kitchen.. 1 daily 5)  Omeprazole 20 Mg Tbec (Omeprazole) .... One tab  daily 6)  Fluoxetine Hcl 20 Mg Caps (Fluoxetine hcl) .... One by mouth once daily 7)  Alprazolam 0.5 Mg Tabs (Alprazolam) .... One by mouth q 8 hours as needed 8)  Benicar 20 Mg Tabs (Olmesartan medoxomil) .... One by mouth once daily 9)  Ipratropium-albuterol 0.5-2.5 (3) Mg/70ml Soln (Ipratropium-albuterol) .... Inhale contents of 1 viall in nebulizer as needed for asthma 10)  Astelin 137 Mcg/spray Soln (Azelastine hcl) .Marland Kitchen.. 1-2 sprays per nostril two times a day as needed allergies 11)  Adderall Xr 25 Mg Xr24h-cap  (Amphetamine-dextroamphetamine) .... Once daily may fill in one month 12)  Adderall 20 Mg Tabs (Amphetamine-dextroamphetamine) .... One by mouth q pm 13)  Levaquin 500 Mg Tabs (Levofloxacin) .... One by mouth qd  Patient Instructions: 1)  Followup with Dr. Caryl Never in 1-2 months Prescriptions: LEVAQUIN 500 MG TABS (LEVOFLOXACIN) one by mouth qd  #7 x 0   Entered and Authorized by:   Edwyna Perfect MD   Signed by:   Edwyna Perfect MD on 10/30/2010   Method used:   Print then Give to Patient   RxID:   [id number] ADDERALL 20 MG TABS (AMPHETAMINE-DEXTROAMPHETAMINE) one by mouth q pm  #30 x 0   Entered and Authorized by:   Edwyna Perfect MD   Signed by:   Edwyna Perfect MD on 10/30/2010   Method used:   Print then Give to Patient   RxID:   [id number]    Orders Added: 1)  Est. Patient Level IV [63016]

## 2010-12-06 NOTE — Assessment & Plan Note (Signed)
Summary: fu per pt/njr   Vital Signs:  Patient profile:   45 year old female Temp:     98.7 degrees F oral BP sitting:   158 / 98  (left arm) Cuff size:   regular  Vitals Entered By: [name] LPN (October 23, 2009 4:22 PM) CC: Anxiety concerns, Hypertension Management   History of Present Illness: Patient here to discuss the following items  She has history of asthma and frequent, almost daily episodes of dyspnea and had recent pulmonary followup. Reportedly her pulmonary function tests came back relatively normal. It appears that she has a definite component of anxiety. She's had multiple situational stressors over the past year with being a single parent, financial issues, and work stress. They seem to exacerbate somewhat. On the other hand she has episodes of anxiety and dyspnea that come on unprovoked.  She continues to use Spiriva and Asmanex regularly.  multiple ER visits over past year for dyspnea.  History of ADD and had been on Adderall but that seemed to exacerbate her anxiety symptoms. She is off that. Has been on Wellbutrin XL per another physician but that does not seem to be helping her ADD symptoms. She denies any significant depression issues. Has previously been on Prozac for anxiety symptoms and that seemed to help. Has used alprazolam for short-term use.  Other issue is elevated blood pressure. Currently on HCTZ 25 mg one half tablet daily.  Has had some weight gain which may be exacerbating. No alcohol use. Nonsmoker. No regular exercise.  Hypertension History:      She denies headache, chest pain, palpitations, orthopnea, peripheral edema, and syncope.        Positive major cardiovascular risk factors include hypertension.  Negative major cardiovascular risk factors include female age less than 66 years old and non-tobacco-user status.     Allergies: 1)  ! 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   Review of Systems       The patient complains of weight gain.  The patient denies anorexia, fever, weight loss, chest pain, syncope, dyspnea on exertion, peripheral edema, prolonged cough, and headaches.    Physical Exam  General:  Well-developed,well-nourished,in no acute distress; alert,appropriate and cooperative throughout examination Ears:  External ear exam shows no significant lesions or deformities.  Otoscopic examination reveals clear canals, tympanic membranes are intact bilaterally without bulging, retraction, inflammation or discharge. Hearing is grossly normal bilaterally. Mouth:  Oral mucosa and oropharynx without lesions or exudates.  Teeth in good repair. Neck:  No deformities, masses, or tenderness noted. Lungs:  Normal respiratory effort, chest expands symmetrically. Lungs are clear to auscultation, no crackles or wheezes. Heart:  Normal rate and regular rhythm. S1 and S2 normal without gallop, murmur, click, rub or other extra sounds. Extremities:  no peripheral edema   Impression & Recommendations:  Problem # 1:  HYPERTENSION (ICD-401.9) Assessment Deteriorated lifestyle modification discussed.  Work on weight loss.  Add Benicar 20 mg once daily and reassess BP one month. Her updated medication list for this problem includes:    Hydrochlorothiazide 25 Mg Tabs (Hydrochlorothiazide) ..... One half tablet once daily    Benicar 20 Mg Tabs (Olmesartan medoxomil) ..... One by mouth once daily  Problem # 2:  ANXIETY (ICD-300.00) Assessment: Deteriorated No evidence for signifi. depression.  ?component of panic disorder. d/c wellbutrin.  Start fluoxetine and as  needed short term alprazolam.  Reassess in one month. The following medications were removed from the medication list:    Wellbutrin Xl 150 Mg Xr24h-tab (Bupropion hcl) ..... Bid Her updated medication list for this problem includes:    Fluoxetine Hcl 20 Mg Caps (Fluoxetine hcl) ..... One by mouth once daily    Alprazolam 0.5 Mg Tabs (Alprazolam) ..... One by mouth q 8 hours as needed  Complete Medication List: 1)  Ventolin Hfa 108 (90 Base) Mcg/act Aers (Albuterol sulfate) .... As needed 2)  Hydrochlorothiazide 25 Mg Tabs (Hydrochlorothiazide) .... One half tablet once daily 3)  Asmanex 30 Metered Doses 220 Mcg/inh Aepb (Mometasone furoate) .... Two times a day 4)  Xopenex Hfa 45 Mcg/act Aero (Levalbuterol tartrate) .... 2 puffs four times a day as needed rescue 5)  Spiriva Handihaler 18 Mcg Caps (Tiotropium bromide monohydrate) .Marland Kitchen.. 1 daily 6)  Otc Reflux Meds  .... Once daily as needed 7)  Fluoxetine Hcl 20 Mg Caps (Fluoxetine hcl) .... One by mouth once daily 8)  Alprazolam 0.5 Mg Tabs (Alprazolam) .... One by mouth q 8 hours as needed 9)  Benicar 20 Mg Tabs (Olmesartan medoxomil) .... One by mouth once daily  Hypertension Assessment/Plan:      The patient's hypertensive risk group is category A: No risk factors and no target organ damage.  Today's blood pressure is 158/98.    Patient Instructions: 1)  Taper off Wellbutrin. Consider one tablet daily for one week then discontinue. 2)  Start Benicar 20 mg one tablet daily. Continue hydrochlorothiazide 25 mg one half tablet daily 3)  Please schedule a follow-up appointment in 1 month.  Prescriptions: ALPRAZOLAM 0.5 MG TABS (ALPRAZOLAM) one by mouth q 8 hours as needed  #60 x 0   Entered and Authorized by:   Evelena Peat MD   Signed by:   Evelena Peat MD on 10/23/2009   Method used:   Print then Give to Patient   RxID:   [id number] FLUOXETINE HCL 20 MG CAPS (FLUOXETINE HCL) one by mouth once daily  #30 x 6   Entered and  Authorized by:   Evelena Peat MD   Signed by:   Evelena Peat MD on 10/23/2009   Method used:   Electronically to        [facility]. [phone]* (retail)       [phone]       [place], [place]  [address]       Ph: [phone]       Fax: [phone]   RxID:   [id number]

## 2010-12-06 NOTE — Progress Notes (Signed)
Summary: Pt req script for Adderall XR 25mg   Phone Note Refill Request Call back at [phone] cell Message from:  Patient on September 07, 2010 10:21 AM  Refills Requested: Medication #1:  ADDERALL XR 25 MG XR24H-CAP one  tab by mouth daily   Dosage confirmed as above?Dosage Confirmed  Method Requested: Pick up at Office Initial call taken by: [name],  September 07, 2010 10:21 AM  Follow-up for Phone Call        Last filled on 08/08/2010, #30, 0 refills [name] Falcon LPN  September 07, 2010 12:08 PM OK to refill Follow-up by: Evelena Peat MD,  September 07, 2010 1:21 PM  Additional Follow-up for Phone Call Additional follow up Details #1::        Pt informed Additional Follow-up by: [name] Falcon LPN,  September 07, 2010 1:28 PM    Prescriptions: ADDERALL XR 25 MG XR24H-CAP (AMPHETAMINE-DEXTROAMPHETAMINE) one  tab by mouth daily  #30 x 0   Entered and Authorized by:   Evelena Peat MD   Signed by:   Evelena Peat MD on 09/07/2010   Method used:   Print then Give to Patient   RxID:   [id number]

## 2010-12-06 NOTE — Progress Notes (Signed)
Summary: Pt req script for Adderall XR 20mg   Phone Note Call from Patient Call back at Home Phone [phone]   Caller: Patient Call For: Evelena Peat MD Complaint: Breathing Problems Summary of Call: Pt req script for Adderall XR 20mg .   Initial call taken by: [name],  January 03, 2010 11:45 AM  Follow-up for Phone Call        Last filled 12/14/2009, pt informed on VM too early to fill.  Will fill on 3/10. [name] LPN  January 04, 8468 12:02 PM refilled. Follow-up by: Evelena Peat MD,  January 11, 2010 9:36 AM  Additional Follow-up for Phone Call Additional follow up Details #1::        Message left Rx ready for pick-up on home phone Additional Follow-up by: [name] LPN,  January 11, 2010 9:44 AM    Prescriptions: ADDERALL XR 25 MG XR24H-CAP (AMPHETAMINE-DEXTROAMPHETAMINE) one by mouth once daily  #30 x 0   Entered and Authorized by:   Evelena Peat MD   Signed by:   Evelena Peat MD on 01/11/2010   Method used:   Print then Give to Patient   RxID:   [id number]

## 2010-12-06 NOTE — Assessment & Plan Note (Signed)
Summary: new to [place]//ccm   Vital Signs:  Patient profile:   45 year old female Height:      65 inches Weight:      146 pounds BMI:     24.38 Temp:     98.2 degrees F oral Pulse rate:   80 / minute Pulse rhythm:   regular Resp:     12 per minute BP sitting:   150 / 102  (left arm)  Vitals Entered By: [name] LPN (May 25, 2009 11:33 AM)  Serial Vital Signs/Assessments:  Time      Position  BP       Pulse  Resp  Temp     By                     178/105                        Evelena Peat MD  CC: Previous [place] pt, to establish, BP running high   History of Present Illness: Patient is a new to establish care. She has long history of asthma and is followed by pulmonologist. She has recently had problems with elevated blood pressure over past several months. At one point the past she was briefly treated with HCTZ. She has a home blood pressure monitor and has had several readings 150-170 systolic and 100-110 diastolic. She's not had any headaches. Has had some fatigue issues. No alcohol use. Nonsmoker. No recent nonsteroidal use. Family history positive for hypertension both parents.  She recalls at one point in past on Benicar and apparently also been on HCTZ at one point. Does not recall any side effects with either.  Other issue is that she has severe situational stressors. She is a single parent. Her kids are getting ready to go out of the country with her ex-husband and she is requesting refill of Xanax which she's used very sparingly in the past. No history of misuse.   Allergies (verified): 1)  ! Aspirin (Aspirin) 2)  Codeine Sulfate (Codeine Sulfate) 3)  Hydrocodone-Acetaminophen (Hydrocodone-Acetaminophen)  Past History:  Past Medical History: Depression Hypertension Asthma GERD Hepatitis A Jaundice Blood transfusion Migraines UTI  Past Surgical History: Appendectomy 1994 Tonsillectomy  1973  Family History: Family History of  Alcoholism/Addiction, parent, blood relatine Family History Ovarian cancer, grandmother Family history breast cancer, grandmother Family History Hypertension Family history emotional illness Family history diabetes, blood relative  Social History: Occupation:  Chief Financial Officer Divorced Occupation:  employed  Review of Systems       Frequently feels anxious. Asthmas been stable. Denies any recent headaches, diplopia, dizziness, chest pains, syncope. She's had some mild peripheral edema.  Physical Exam  General:  Well-developed,well-nourished,in no acute distress; alert,appropriate and cooperative throughout examination Mouth:  Oral mucosa and oropharynx without lesions or exudates.  Teeth in good repair. Neck:  No deformities, masses, or tenderness noted. Lungs:  Normal respiratory effort, chest expands symmetrically. Lungs are clear to auscultation, no crackles or wheezes. Heart:  Normal rate and regular rhythm. S1 and S2 normal without gallop, murmur, click, rub or other extra sounds. Extremities:  No clubbing, cyanosis, edema, or deformity noted with normal full range of motion of all joints.     Impression & Recommendations:  Problem # 1:  HYPERTENSION (ICD-401.9)  Currently untreated. Discussed lifestyle issues with weight loss and exercise. Start HCTZ 12.5 mg daily and blood pressure followup within one month.  Her updated medication list for this problem  includes:    Hydrochlorothiazide 25 Mg Tabs (Hydrochlorothiazide) ..... One half tablet once daily  Problem # 2:  ANXIETY, SITUATIONAL (ICD-308.3) refilled Xanax for sparing use.  Complete Medication List: 1)  Wellbutrin Xl 150 Mg Xr24h-tab (Bupropion hcl) .... Bid 2)  Ventolin Hfa 108 (90 Base) Mcg/act Aers (Albuterol sulfate) .... As needed 3)  Alprazolam 0.5 Mg Tabs (Alprazolam) .... One by mouth q 8 hours as needed 4)  Hydrochlorothiazide 25 Mg Tabs (Hydrochlorothiazide) .... One half tablet once daily 5)  Spiriva  Handihaler 18 Mcg Caps (Tiotropium bromide monohydrate) .... Once daily 6)  Asmanex 30 Metered Doses 220 Mcg/inh Aepb (Mometasone furoate) .... Two times a day  Patient Instructions: 1)  Please schedule a follow-up appointment in 1 month.  2)  It is important that you exercise reguarly at least 20 minutes 5 times a week. If you develop chest pain, have severe difficulty breathing, or feel very tired, stop exercising immediately and seek medical attention.  3)  You need to lose weight. Consider a lower calorie diet and regular exercise.  4)  Eat more potassium rich foods such as bananas, oranje juice, and salt substitutes .  Prescriptions: HYDROCHLOROTHIAZIDE 25 MG TABS (HYDROCHLOROTHIAZIDE) one half tablet once daily  #30 x 11   Entered and Authorized by:   Evelena Peat MD   Signed by:   Evelena Peat MD on 05/25/2009   Method used:   Electronically to        [facility]. [phone]* (retail)       [phone]       [place], [place]  [address]       Ph: [phone]       Fax: [phone]   RxID:   [id number] ALPRAZOLAM 0.5 MG TABS (ALPRAZOLAM) one by mouth q 8 hours as needed  #60 x 0   Entered and Authorized by:   Evelena Peat MD   Signed by:   Evelena Peat MD on 05/25/2009   Method used:   Print then Give to Patient   RxID:   [id number]

## 2010-12-06 NOTE — Progress Notes (Signed)
Summary: Adderall dose update  Phone Note Call from Patient Call back at Home Phone [phone]   Caller: Patient Reason for Call: Privacy/Consent Authorization Summary of Call: Pt called re: Adderall XR 25mg  once daily. Pt says that she needs this adjusted to 25mg  and a 10mg  in afternoon? Pt says that this is the way she used to take this med.  Initial call taken by: [name],  February 21, 2010 1:30 PM  Follow-up for Phone Call        Spoke with pt, informed her Dr Caryl Never out of office the next few days.  No problem, this is more of an FYI as they had discussed at last OV.  Next refills not due until May 10 Follow-up by: [name] LPN,  February 21, 2010 1:56 PM  Additional Follow-up for Phone Call Additional follow up Details #1::        We can add 10mg  of regular (non extended release) at time of next refill unless she needs sooner. Additional Follow-up by: Evelena Peat MD,  February 25, 2010 9:59 PM    Additional Follow-up for Phone Call Additional follow up Details #2::    Called patient and left message on machine FOR PT-WILL CALL IF SHE WANTS 10 MG EARLIER THAN NEXT REFILL Follow-up by: Willy Eddy, LPN,  February 26, 2010 9:27 AM  New/Updated Medications: ADDERALL 10 MG TABS (AMPHETAMINE-DEXTROAMPHETAMINE) 1 once daily IN PM

## 2010-12-06 NOTE — Progress Notes (Signed)
Summary: REQ FOR REFILL (Adderall)  Phone Note Call from Patient   Caller: Patient  [phone] Reason for Call: Refill Medication, Talk to Nurse Summary of Call: Pt called to speak with Harriett Sine, LPN.... Pt wanted to req a refill on med: ADDERALL XR 25 MG .... Pt adv that the refill date is on Sunday, 02/11/2010 but the pt has to leave on Friday evening to go out of town for training would like to p/u Rx before she leaves to go oot (even if Rx shows that it can't be filled till Sunday, 02/11/2010) ....... Pt can be reached at [phone] with any questions or concerns .... to adv when same is ready for p/u.  Initial call taken by: Debbra Riding,  February 08, 2010 1:27 PM  Follow-up for Phone Call        OK to refill. Follow-up by: Evelena Peat MD,  February 09, 2010 8:31 AM  Additional Follow-up for Phone Call Additional follow up Details #1::        Pt informed on VM 3 RX ready for pick-up Additional Follow-up by: [name] LPN,  February 09, 2010 9:09 AM    New/Updated Medications: ADDERALL XR 25 MG XR24H-CAP (AMPHETAMINE-DEXTROAMPHETAMINE) on tab by mouth daily May fill in one month ADDERALL XR 25 MG XR24H-CAP (AMPHETAMINE-DEXTROAMPHETAMINE) one tab by mouth daily May fill in two months Prescriptions: ADDERALL XR 25 MG XR24H-CAP (AMPHETAMINE-DEXTROAMPHETAMINE) one tab by mouth daily May fill in two months  #30 x 0   Entered by:   [name] LPN   Authorized by:   Evelena Peat MD   Signed by:   [name] LPN on 08/65/7846   Method used:   Print then Give to Patient   RxID:   [id number] ADDERALL XR 25 MG XR24H-CAP (AMPHETAMINE-DEXTROAMPHETAMINE) on tab by mouth daily May fill in one month  #30 x 0   Entered by:   [name] LPN   Authorized by:   Evelena Peat MD   Signed by:   [name] LPN on 27/25/3664   Method used:   Print then Give to Patient   RxID:   [id number] ADDERALL XR 25 MG XR24H-CAP (AMPHETAMINE-DEXTROAMPHETAMINE) one by mouth once  daily  #30 x 0   Entered and Authorized by:   Evelena Peat MD   Signed by:   Evelena Peat MD on 02/09/2010   Method used:   Print then Give to Patient   RxID:   [id number]

## 2010-12-06 NOTE — Assessment & Plan Note (Signed)
Summary: rov/apc   Primary Provider/Referring Provider:  Burchette  CC:  follow up visit.  History of Present Illness:  August 11, 2009 Asthma increased SOB; hands numb. hx asthma since age 45. Off and on x 3 years episodes of dyspnea with panic attacks. Feels well in betweeen episodes. Gets frantic with hands tingling. She's tired of being told it's anxiety. [name] 3 weeks ago dx'd asthma. Prednisone helped. Usually she doesn't wheeze, but can't breathe deeply. Well in between episodes which now happen daily and last 30-60 minutes, resolving if she focuses on something else. uses inhaler but doesn't expect it to help. EKG reported normal. Worse if stressed and now struggling with divorce, finances, single parent. Admits she's scared. Weather changes, exposures may make her tight but "different". No longer has PF meter.  October 05, 2009- Asthma, ? panic/ anxiety?, GERD ER twice- once for asthma, then back with severe reflux after they gave prednisone. She is treating this otc and doing better. Has PF meter- not used. Has started walking regularly. Daily feels tightest on waking. Persistent cough and daily mucus- white sticky mucus. We discussed her previous lack of success with psychologist for her anxiety depression. She still is struggling with divorce/ child issues. Spiriva does help. Had flu vax.  Reviewed PFT- reassured her it was normal- not COPD which she was focused on.    Current Medications (verified): 1)  Wellbutrin Xl 150 Mg Xr24h-Tab (Bupropion Hcl) .... Bid 2)  Ventolin Hfa 108 (90 Base) Mcg/act Aers (Albuterol Sulfate) .... As Needed 3)  Hydrochlorothiazide 25 Mg Tabs (Hydrochlorothiazide) .... One Half Tablet Once Daily 4)  Asmanex 30 Metered Doses 220 Mcg/inh Aepb (Mometasone Furoate) .... Two Times A Day 5)  Xopenex Hfa 45 Mcg/act Aero (Levalbuterol Tartrate) .... 2 Puffs Four Times A Day As Needed Rescue 6)  Spiriva Handihaler 18 Mcg Caps (Tiotropium Bromide  Monohydrate) .Marland Kitchen.. 1 Daily 7)  Otc Reflux Meds .... Once Daily As Needed  Allergies (verified): 1)  ! Aspirin (Aspirin) 2)  Codeine Sulfate (Codeine Sulfate) 3)  Hydrocodone-Acetaminophen (Hydrocodone-Acetaminophen)  Past History:  Past Medical History: Last updated: 05/25/2009 Depression Hypertension Asthma GERD Hepatitis A Jaundice Blood transfusion Migraines UTI  Past Surgical History: Last updated: 05/25/2009 Appendectomy 1994 Tonsillectomy  1973  Family History: Last updated: 08/11/2009 Family History of Alcoholism/Addiction, parent, blood relative Family History Ovarian cancer, grandmother Family history breast cancer, grandmother Family History Hypertension Family history emotional illness Family history diabetes, blood relative Son with asthma and nut allergy  Social History: Last updated: 08/11/2009 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   Risk Factors: Smoking Status: never (08/11/2009)  Review of Systems      See HPI  The patient denies anorexia, fever, weight loss, weight gain, vision loss, decreased hearing, hoarseness, chest pain, syncope, dyspnea on exertion, peripheral edema, prolonged cough, headaches, hemoptysis, abdominal pain, and severe indigestion/heartburn.    Vital Signs:  Patient profile:   45 year old female Height:      65 inches O2 Sat:      98 % on Room air Pulse rate:   103 / minute BP sitting:   164 / 98  (left arm) Cuff size:   regular  Vitals Entered By: [name] CMA (October 05, 2009 2:56 PM)  O2 Flow:  Room air  Physical Exam  Additional Exam:  General: A/Ox3; pleasant and cooperative, NAD,talkative, pressured SKIN: no rash, lesions NODES: no lymphadenopathy HEENT: [place]/AT, EOM- WNL, Conjuctivae-  clear, PERRLA, TM-WNL, Nose- clear, Throat- clear and wnl, [place] II NECK: Supple w/ fair ROM, JVD- none, normal carotid impulses w/o bruits Thyroid-  CHEST: Clear to  P&A HEART: RRR, no m/g/r heard ABDOMEN: Soft and nl; ZOX:WRUE, nl pulses, no edema  NEURO: Grossly intact to observation      Impression & Recommendations:  Problem # 1:  ASTHMA (ICD-493.90) Anxiety and panic are probably most of her dyspnea, rather than true asthma. We will leave her with less stimulating types of meds and some reassurance. i asked her again to find her peakflow meter and remind herself what her baseline is for use when dyspneic. She may benefit from an anxiolytic and she definitely needs ongoing help with her GERD.  Medications Added to Medication List This Visit: 1)  Otc Reflux Meds  .... Once daily as needed  Other Orders: Est. Patient Level III (45409)   Patient Instructions: 1)  Schedule return in one year, earlier if needed 2)  Don't forget to get out your peak flow meter. Use it now while you are feeling well, to remind  yourself what your good scores look like. Then use it when you don't feel well so you have numbers you can use in talking with your doctors.

## 2010-12-06 NOTE — Assessment & Plan Note (Signed)
Summary: ?cold or asthma flare up/njr   Vital Signs:  Patient profile:   45 year old female Menstrual status:  regular LMP:     01/27/2010 Weight:      150 pounds O2 Sat:      98 % on Room air Temp:     98.4 degrees F oral Pulse rate:   78 / minute BP sitting:   130 / 90  (left arm) Cuff size:   regular  Vitals Entered By: Romualdo Bolk, CMA (AAMA) (February 13, 2010 3:18 PM)  O2 Flow:  Room air CC: Coughing, runny nose, congestion, wheezing, sob that started 4/10. Pt had a nebulizer tx around 2pm today. LMP (date): 01/27/2010     Menstrual Status regular Enter LMP: 01/27/2010   Preventive Screening-Counseling & Management  Alcohol-Tobacco     Alcohol drinks/day: 0     Smoking Status: never  Caffeine-Diet-Exercise     Caffeine use/day: less than 1 a day     Does Patient Exercise: no  Current Medications (verified): 1)  Ventolin Hfa 108 (90 Base) Mcg/act Aers (Albuterol Sulfate) .... As Needed 2)  Hydrochlorothiazide 25 Mg Tabs (Hydrochlorothiazide) .... One Half Tablet Once Daily 3)  Asmanex 30 Metered Doses 220 Mcg/inh Aepb (Mometasone Furoate) .... Two Times A Day 4)  Spiriva Handihaler 18 Mcg Caps (Tiotropium Bromide Monohydrate) .Marland Kitchen.. 1 Daily 5)  Nexium 40 Mg Cpdr (Esomeprazole Magnesium) 6)  Fluoxetine Hcl 20 Mg Caps (Fluoxetine Hcl) .... One By Mouth Once Daily 7)  Alprazolam 0.5 Mg Tabs (Alprazolam) .... One By Mouth Q 8 Hours As Needed 8)  Benicar 20 Mg Tabs (Olmesartan Medoxomil) .... One By Mouth Once Daily 9)  Adderall Xr 25 Mg Xr24h-Cap (Amphetamine-Dextroamphetamine) .... One By Mouth Once Daily 10)  Ipratropium-Albuterol 0.5-2.5 (3) Mg/7ml Soln (Ipratropium-Albuterol) .... Inhale Contents of 1 Viall in Nebulizer As Needed For Asthma 11)  Astelin 137 Mcg/spray Soln (Azelastine Hcl) .Marland Kitchen.. 1-2 Sprays Per Nostril Two Times A Day As Needed Allergies 12)  Adderall Xr 25 Mg Xr24h-Cap (Amphetamine-Dextroamphetamine) .... On Tab By Mouth Daily May Fill in One  Month 13)  Adderall Xr 25 Mg Xr24h-Cap (Amphetamine-Dextroamphetamine) .... One Tab By Mouth Daily May Fill in Two Months  Allergies (verified): 1)  ! Aspirin (Aspirin) 2)  Codeine Sulfate (Codeine Sulfate) 3)  Hydrocodone-Acetaminophen (Hydrocodone-Acetaminophen)  Social History: Caffeine use/day:  less than 1 a day Does Patient Exercise:  no   Complete Medication List: 1)  Ventolin Hfa 108 (90 Base) Mcg/act Aers (Albuterol sulfate) .... As needed 2)  Hydrochlorothiazide 25 Mg Tabs (Hydrochlorothiazide) .... One half tablet once daily 3)  Asmanex 30 Metered Doses 220 Mcg/inh Aepb (Mometasone furoate) .... Two times a day 4)  Spiriva Handihaler 18 Mcg Caps (Tiotropium bromide monohydrate) .Marland Kitchen.. 1 daily 5)  Nexium 40 Mg Cpdr (Esomeprazole magnesium) 6)  Fluoxetine Hcl 20 Mg Caps (Fluoxetine hcl) .... One by mouth once daily 7)  Alprazolam 0.5 Mg Tabs (Alprazolam) .... One by mouth q 8 hours as needed 8)  Benicar 20 Mg Tabs (Olmesartan medoxomil) .... One by mouth once daily 9)  Adderall Xr 25 Mg Xr24h-cap (Amphetamine-dextroamphetamine) .... One by mouth once daily 10)  Ipratropium-albuterol 0.5-2.5 (3) Mg/48ml Soln (Ipratropium-albuterol) .... Inhale contents of 1 viall in nebulizer as needed for asthma 11)  Astelin 137 Mcg/spray Soln (Azelastine hcl) .Marland Kitchen.. 1-2 sprays per nostril two times a day as needed allergies 12)  Adderall Xr 25 Mg Xr24h-cap (Amphetamine-dextroamphetamine) .... On tab by mouth daily may fill in one  month 13)  Adderall Xr 25 Mg Xr24h-cap (Amphetamine-dextroamphetamine) .... One tab by mouth daily may fill in two months  Other Orders: No Charge Patient Arrived (NCPA0) (NCPA0) patient had to leave  to pick up her children as i was coming to see her.   today .  she will rechedule for tomorrow with time preference. WKP

## 2010-12-06 NOTE — Assessment & Plan Note (Signed)
Summary: rov/ mbw   Primary Provider/Referring Provider:  Burchette  CC:  increased SOB; hands numb-hx of asthma.  History of Present Illness:  PROBLEM:  Asthma.  HISTORY:  This had been a previous patient at [facility], where he was last seen in 2005.  Prior to that, she was seen at this office and thought to have asthma with viral exacerbation. She had been hospitalized in 2002 with severe asthma, requiring magnesium.  She says, since she went on Spiriva plus Asmanex, she has done very much better with almost none of the recurrent pattern of exacerbation that she had previously.  Usual triggers now are strong odors, moldy smells, humidity and viral infections.  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 One son has asthma.  Aspirin makes her wheeze.  No history of nasal polyps  August 11, 2009 Asthma increased SOB; hands numb. hx asthma since age 52. Off and on x 3 years episodes of dyspnea with panic attacks. Feels well in betweeen episodes. Gets frantic with hands tingling. She's tired of being told it's anxiety. [place] 3 weeks ago dx'd asthma. Prednisone helped. Usually she doesn't wheeze, but can't breathe deeply. Well in between episodes which now happen daily and last 30-60 minutes, resolving if she focuses on something else. uses inhaler but doesn't expect it to help. EKG reported normal. Worse if stressed and now struggling with divorce, finances, single parent. Admits she's scared. Weather changes, exposures may make her tight but "different". No longer has PF meter.   Preventive Screening-Counseling & Management  Alcohol-Tobacco     Smoking Status: never  Current Medications (verified): 1)  Wellbutrin Xl 150 Mg Xr24h-Tab (Bupropion Hcl) .... Bid 2)  Ventolin Hfa 108 (90 Base) Mcg/act Aers (Albuterol Sulfate) .... As Needed 3)  Hydrochlorothiazide 25 Mg Tabs (Hydrochlorothiazide) .... One Half Tablet  Once Daily 4)  Asmanex 30 Metered Doses 220 Mcg/inh Aepb (Mometasone Furoate) .... Two Times A Day  Allergies (verified): 1)  ! Aspirin (Aspirin) 2)  Codeine Sulfate (Codeine Sulfate) 3)  Hydrocodone-Acetaminophen (Hydrocodone-Acetaminophen)  Past History:  Past Medical History: Last updated: 05/25/2009 Depression Hypertension Asthma GERD Hepatitis A Jaundice Blood transfusion Migraines UTI  Past Surgical History: Last updated: 05/25/2009 Appendectomy 1994 Tonsillectomy  1973  Family History: Last updated: 08/11/2009 Family History of Alcoholism/Addiction, parent, blood relative Family History Ovarian cancer, grandmother Family history breast cancer, grandmother Family History Hypertension Family history emotional illness Family history diabetes, blood relative Son with asthma and nut allergy  Social History: Last updated: 08/11/2009 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  Smoking Status:  never  Review of Systems      See HPI  Vital Signs:  Patient profile:   45 year old female Height:      65 inches Weight:      150 pounds BMI:     25.05 O2 Sat:      99 % on Room air Pulse rate:   115 / minute BP sitting:   160 / 98  (left arm) Cuff size:   regular  Vitals Entered By: [name] CMA (August 11, 2009 1:36 PM)  O2 Flow:  Room air  Physical Exam  Additional Exam:  General: A/Ox3; pleasant and cooperative, NAD, SKIN: no rash, lesions NODES: no lymphadenopathy HEENT:  [place]/AT, EOM- WNL, Conjuctivae- clear, PERRLA, TM-WNL, Nose- clear, Throat- clear and wnl, [place] II NECK: Supple w/ fair ROM, JVD- none, normal carotid impulses w/o bruits Thyroid- normal to palpation CHEST: Clear to P&A HEART: RRR, no m/g/r heard ABDOMEN: Soft and nl; DGU:YQIH, nl pulses, no edema  NEURO: Grossly intact to observation      Impression & Recommendations:  Problem # 1:  ASTHMA (ICD-493.90) Significant anxiety, hyperventilation and probably reflux aspects. We discussed carefuflly and reassurred her. I will let her resume walking to discharge stress., resume Spiriva which she liked, change Ventolin to Xopenex for less stimulation. discussed breathing with a paper bag.  Medications Added to Medication List This Visit: 1)  Xopenex Hfa 45 Mcg/act Aero (Levalbuterol tartrate) .... 2 puffs four times a day as needed rescue 2)  Spiriva Handihaler 18 Mcg Caps (Tiotropium bromide monohydrate) .Marland Kitchen.. 1 daily  Other Orders: Est. Patient Level II (47425) Pulmonary Referral (Pulmonary)  Patient Instructions: 1)  Please schedule a follow-up appointment in 1 month. 2)  schedule PFT 3)  Try xopenex rescue inhaler 2 puffs four times a day as needed  4)  Try breathing into a small paper bag if you get anxious and short of breath. Prescriptions: SPIRIVA HANDIHALER 18 MCG CAPS (TIOTROPIUM BROMIDE MONOHYDRATE) 1 daily  #30 x prn   Entered and Authorized by:   Waymon Budge MD   Signed by:   Waymon Budge MD on 08/11/2009   Method used:   Historical   RxID:   [id number] XOPENEX HFA 45 MCG/ACT AERO (LEVALBUTEROL TARTRATE) 2 puffs four times a day as needed rescue  #1 x prn   Entered and Authorized by:   Waymon Budge MD   Signed by:   Waymon Budge MD on 08/11/2009   Method used:   Print then Give to Patient   RxID:   [id number]

## 2010-12-06 NOTE — Assessment & Plan Note (Signed)
Summary: FUP//CCM   Vital Signs:  Patient profile:   45 year old female Temp:     98.7 degrees F oral BP sitting:   124 / 84  (left arm) Cuff size:   regular  Vitals Entered By: [name] LPN (December 14, 2009 4:15 PM) CC: 1 month follow-up   History of Present Illness: Here for follow up to discuss the following:  Recheck BP.  Added Benicar and tolerating well and BP improved.  No signif dizziness.  Anxiety. ? some improved on Prozac. Not using Xanax.  She thinks a good part of this relateds to her fear of breathing problems.  In past has used albuterol for dyspnea that in retrospect probably was not asthma related.  Hx ADD.  Previously on Adderall  which definitely seemed to help with her focusing.  She had some concerns re whether this might have aggravated her anxiety but was also concommitently on Wellbutrin. She is having a very difficult time focusing and functioning both at work and at home.  She requests repeat trial of Adderall.  Allergies: 1)  ! 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  PMH reviewed for relevance  Review of Systems       The patient complains of weight gain.  The patient denies anorexia, chest pain, syncope, dyspnea on exertion, peripheral edema, prolonged cough, headaches, and abdominal pain.    Physical Exam  General:  Well-developed,well-nourished,in no acute distress; alert,appropriate and cooperative throughout examination Mouth:  Oral mucosa and oropharynx without lesions or exudates.  Teeth in  good repair. Neck:  No deformities, masses, or tenderness noted. Lungs:  Normal respiratory effort, chest expands symmetrically. Lungs are clear to auscultation, no crackles or wheezes. Heart:  Normal rate and regular rhythm. S1 and S2 normal without gallop, murmur, click, rub or other extra sounds. Extremities:  no edema.   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 She definitely benefited on the basis of ADD. Question is whether she has any anxiety associated symptoms. Repeat trial of Adderall XR 25 mg one daily. Watch blood pressure closely.  Problem # 2:  HYPERTENSION (ICD-401.9) Assessment: Improved samples Benicar given. Her updated medication list for this problem includes:    Hydrochlorothiazide 25 Mg Tabs (Hydrochlorothiazide) ..... One half tablet once daily    Benicar 20 Mg Tabs (Olmesartan medoxomil) ..... One by mouth once daily  Problem # 3:  ANXIETY (ICD-300.00) Assessment: Improved  Her updated medication list for this problem includes:    Fluoxetine Hcl 20 Mg Caps (Fluoxetine hcl) ..... One by mouth once daily    Alprazolam 0.5 Mg Tabs (Alprazolam) ..... One by mouth q 8 hours as needed  Complete Medication List: 1)  Ventolin Hfa 108 (90 Base) Mcg/act Aers (Albuterol sulfate) .... As needed 2)  Hydrochlorothiazide 25 Mg Tabs (Hydrochlorothiazide) .... One half tablet once daily 3)  Asmanex 30 Metered Doses 220 Mcg/inh Aepb (Mometasone furoate) .... Two times a day 4)  Xopenex Hfa 45 Mcg/act Aero (Levalbuterol tartrate) .... 2 puffs four times a day as needed rescue 5)  Spiriva Handihaler 18 Mcg Caps (Tiotropium bromide  monohydrate) .Marland Kitchen.. 1 daily 6)  Otc Reflux Meds  .... Once daily as needed 7)  Fluoxetine Hcl 20 Mg Caps (Fluoxetine hcl) .... One by mouth once daily 8)  Alprazolam 0.5 Mg Tabs (Alprazolam) .... One by mouth q 8 hours as needed 9)  Benicar 20 Mg Tabs  (Olmesartan medoxomil) .... One by mouth once daily 10)  Adderall Xr 25 Mg Xr24h-cap (Amphetamine-dextroamphetamine) .... One by mouth once daily  Patient Instructions: 1)  Call or touch base in one month to give feedback regarding Adderall Prescriptions: ADDERALL XR 25 MG XR24H-CAP (AMPHETAMINE-DEXTROAMPHETAMINE) one by mouth once daily  #30 x 0   Entered and Authorized by:   Evelena Peat MD   Signed by:   Evelena Peat MD on 12/14/2009   Method used:   Print then Give to Patient   RxID:   [id number]

## 2010-12-06 NOTE — Assessment & Plan Note (Signed)
Summary: asthma flare up/allergies/cjr/pt rsc/cjr/pt rescd//ccm   Vital Signs:  Patient profile:   45 year old female Temp:     98.9 degrees F oral BP sitting:   140 / 86  (left arm) Cuff size:   regular  Vitals Entered By: [name] LPN (January 29, 2010 2:31 PM) CC: Asthma flare -up, Abdominal Pain   History of Present Illness: Patient here to discuss asthma and allergy issues. Frequent clear post nasal drip symptoms past several days. Occasional eye symptoms. Has taken Benadryl with minimal relief. She thinks this is triggering her asthma.  Mild cough but no signif dyspnea.  No purulent secretions.  Suspects pollen allergy.  Also has frequent daily GERD-type symptoms. Taking over-the-counter Pepcid with minimal relief.  Dyspepsia History:      She has no alarm features of dyspepsia including no history of melena, hematochezia, dysphagia, persistent vomiting, or involuntary weight loss > 5%.  There is a prior history of GERD.  She notes that there have been breakthrough symptoms despite maximum H-2 blocker or PPI therapy.  The patient does not have a prior history of documented ulcer disease.  The dominant symptom is heartburn or acid reflux.  An H-2 blocker medication is currently being taken.  Symptoms have persisted after 4 weeks of H-2 blocker treatment.     Allergies: 1)  ! Aspirin (Aspirin) 2)  Codeine Sulfate (Codeine Sulfate) 3)  Hydrocodone-Acetaminophen (Hydrocodone-Acetaminophen)  Past History:  Past Medical History: Last updated: 05/25/2009 Depression Hypertension Asthma GERD Hepatitis A Jaundice Blood transfusion Migraines UTI PMH reviewed for relevance  Review of Systems      See HPI  Physical Exam  General:  Well-developed,well-nourished,in no acute distress; alert,appropriate and cooperative throughout examination Head:  Normocephalic and atraumatic without obvious abnormalities. No apparent alopecia or balding. Eyes:  pupils equal, pupils round,  pupils reactive to light, corneas and lenses clear, and no injection.   Ears:  External ear exam shows no significant lesions or deformities.  Otoscopic examination reveals clear canals, tympanic membranes are intact bilaterally without bulging, retraction, inflammation or discharge. Hearing is grossly normal bilaterally. Nose:  nasal dischargemucosal pallor.   Mouth:  Oral mucosa and oropharynx without lesions or exudates.  Teeth in good repair. Neck:  No deformities, masses, or tenderness noted. Lungs:  very faint wheezes.  No rales.  No retractions. Heart:  Normal rate and regular rhythm. S1 and S2 normal without gallop, murmur, click, rub or other extra sounds.   Impression & Recommendations:  Problem # 1:  ALLERGIC RHINITIS (ICD-477.9) Assessment Deteriorated try OTC antihistamine such as Allegra and add Astelin nasal. Her updated medication list for this problem includes:    Astelin 137 Mcg/spray Soln (Azelastine hcl) .Marland Kitchen... 1-2 sprays per nostril two times a day as needed allergies  Problem # 2:  GERD (ICD-530.81) Assessment: Deteriorated samples Nexium provided.  Follow up in one month if no better.  Discussed lifestyle/dietary factors.  Problem # 3:  ASTHMA (ICD-493.90) Assessment: Unchanged  Her updated medication list for this problem includes:    Ventolin Hfa 108 (90 Base) Mcg/act Aers (Albuterol sulfate) .Marland Kitchen... As needed    Asmanex 30 Metered Doses 220 Mcg/inh Aepb (Mometasone furoate) .Marland Kitchen..Marland Kitchen Two times a day    Xopenex Hfa 45 Mcg/act Aero (Levalbuterol tartrate) .Marland Kitchen... 2 puffs four times a day as needed rescue    Spiriva Handihaler 18 Mcg Caps (Tiotropium bromide monohydrate) .Marland Kitchen... 1 daily    Ipratropium-albuterol 0.5-2.5 (3) Mg/75ml Soln (Ipratropium-albuterol) ..... Inhale contents of 1 viall  in nebulizer as needed for asthma  Complete Medication List: 1)  Ventolin Hfa 108 (90 Base) Mcg/act Aers (Albuterol sulfate) .... As needed 2)  Hydrochlorothiazide 25 Mg Tabs  (Hydrochlorothiazide) .... One half tablet once daily 3)  Asmanex 30 Metered Doses 220 Mcg/inh Aepb (Mometasone furoate) .... Two times a day 4)  Xopenex Hfa 45 Mcg/act Aero (Levalbuterol tartrate) .... 2 puffs four times a day as needed rescue 5)  Spiriva Handihaler 18 Mcg Caps (Tiotropium bromide monohydrate) .Marland Kitchen.. 1 daily 6)  Otc Reflux Meds  .... Once daily as needed 7)  Fluoxetine Hcl 20 Mg Caps (Fluoxetine hcl) .... One by mouth once daily 8)  Alprazolam 0.5 Mg Tabs (Alprazolam) .... One by mouth q 8 hours as needed 9)  Benicar 20 Mg Tabs (Olmesartan medoxomil) .... One by mouth once daily 10)  Adderall Xr 25 Mg Xr24h-cap (Amphetamine-dextroamphetamine) .... One by mouth once daily 11)  Ipratropium-albuterol 0.5-2.5 (3) Mg/37ml Soln (Ipratropium-albuterol) .... Inhale contents of 1 viall in nebulizer as needed for asthma 12)  Astelin 137 Mcg/spray Soln (Azelastine hcl) .Marland Kitchen.. 1-2 sprays per nostril two times a day as needed allergies  Patient Instructions: 1)  Take Nexium 40 mg one tablet daily 2)  Consider addition of over-the-counter antihistamines such as Allegra or Zyrtec Prescriptions: VENTOLIN HFA 108 (90 BASE) MCG/ACT AERS (ALBUTEROL SULFATE) as needed  #18 Gram x 5   Entered and Authorized by:   Evelena Peat MD   Signed by:   Evelena Peat MD on 01/29/2010   Method used:   Electronically to        [facility]. [phone]* (retail)       [phone] [address]       [place], [place]  [address]       Ph: [phone]       Fax: [phone]   RxID:   [id number] ASTELIN 137 MCG/SPRAY SOLN (AZELASTINE HCL) 1-2 sprays per nostril two times a day as needed allergies  #1 x 6   Entered and Authorized by:   Evelena Peat MD   Signed by:   Evelena Peat MD on 01/29/2010   Method used:   Electronically to        [facility]. [phone]* (retail)       [phone] [address]       [place], [place]  [address]       Ph:  [phone]       Fax: [phone]   RxID:   [id number]

## 2010-12-06 NOTE — Progress Notes (Signed)
Summary: REFILL REQUEST (Adderall)  Phone Note Refill Request Message from:  Patient on April 24, 2010 2:35 PM  Refills Requested: Medication #1:  ADDERALL 10 MG TABS 1 once daily IN PM.   Notes: Pt advised this Rx is due Friday, 04/27/2010...Marland KitchenMarland KitchenMarland Kitchen She can be reached at [phone] when Rx is ready for p/u.  Medication #2:  ADDERALL XR 25 MG XR24H-CAP one tab by mouth daily May fill in two months   Notes: Pt advised this Rx is due Friday, 05/11/2010...Marland KitchenMarland KitchenMarland Kitchen She can be reached at [phone] when Rx is ready for p/u.    Initial call taken by: Debbra Riding,  April 24, 2010 2:37 PM  Follow-up for Phone Call        will refill Follow-up by: Evelena Peat MD,  April 25, 2010 5:06 PM  Additional Follow-up for Phone Call Additional follow up Details #1::        Pt informed Rx ready for pick-up Additional Follow-up by: [name] LPN,  April 25, 2010 5:12 PM    Prescriptions: ADDERALL 10 MG TABS (AMPHETAMINE-DEXTROAMPHETAMINE) 1 once daily IN PM  #30 x 0   Entered by:   Evelena Peat MD   Authorized by:   [name] LPN   Signed by:   Evelena Peat MD on 04/25/2010   Method used:   Print then Give to Patient   RxID:   [id number] ADDERALL XR 25 MG XR24H-CAP (AMPHETAMINE-DEXTROAMPHETAMINE) one tab by mouth daily May fill in two months  #30 x 0   Entered by:   Evelena Peat MD   Authorized by:   [name] LPN   Signed by:   Evelena Peat MD on 04/25/2010   Method used:   Print then Give to Patient   RxID:   [id number] ADDERALL XR 25 MG XR24H-CAP (AMPHETAMINE-DEXTROAMPHETAMINE) on tab by mouth daily May fill in one month  #30 x 0   Entered by:   Evelena Peat MD   Authorized by:   [name] LPN   Signed by:   Evelena Peat MD on 04/25/2010   Method used:   Print then Give to Patient   RxID:   [id number] ADDERALL XR 25 MG XR24H-CAP (AMPHETAMINE-DEXTROAMPHETAMINE) one by mouth once daily  #30 x 0   Entered by:   Evelena Peat MD   Authorized  by:   [name] LPN   Signed by:   Evelena Peat MD on 04/25/2010   Method used:   Print then Give to Patient   RxID:   [id number]

## 2010-12-06 NOTE — Progress Notes (Signed)
Summary: Pt req Adderall 10mg  and Alprazolam 0.5mg   Phone Note Refill Request Call back at Home Phone [phone]   Refills Requested: Medication #1:  ADDERALL 10 MG TABS 1 once daily IN PM.   Dosage confirmed as above?Dosage Confirmed   Supply Requested: 1 month  Medication #2:  ALPRAZOLAM 0.5 MG TABS one by mouth q 8 hours as needed   Dosage confirmed as above?Dosage Confirmed   Supply Requested: 1 month Pt called and is leaving to go out of state on Monday 10/29/10. Pt would like to pick up script by Friday. Pls call pt when ready for pick up. Call in the Alprazolam to [facility] on [address].    Method Requested: Pick up at Office Initial call taken by: [name],  October 23, 2010 8:13 AM  Follow-up for Phone Call        Last filled 11/28  [name] LPN  October 24, 2010 9:36 AM   Additional Follow-up for Phone Call Additional follow up Details #1::        Pt called to check on status of scripts. Pt leaving on Tues 10/30/10 to go out of status. Does pt need an appt to get these filled. Pls call in script for Alprazolam. Pt wants to pick up Adderall script on Monday.  Additional Follow-up by: [name],  October 24, 2010 4:26 PM    Additional Follow-up for Phone Call Additional follow up Details #2::    Adderall XR 25 printed too soon, not ready to fill until 12/22 Pt requested Adderall 10mg , printed, signed, Alprazolam called in, pt informed Follow-up by: [name] LPN,  October 25, 2010 10:50 AM  New/Updated Medications: ADDERALL XR 25 MG XR24H-CAP (AMPHETAMINE-DEXTROAMPHETAMINE) once daily May fill in two months ADDERALL XR 25 MG XR24H-CAP (AMPHETAMINE-DEXTROAMPHETAMINE) once daily may fill in one month Prescriptions: ADDERALL 10 MG TABS (AMPHETAMINE-DEXTROAMPHETAMINE) 1 once daily IN PM  #30 x 0   Entered by:   [name] LPN   Authorized by:   Evelena Peat MD   Signed by:   [name] LPN on 95/62/1308   Method used:   Print  then Give to Patient   RxID:   [id number] ADDERALL XR 25 MG XR24H-CAP (AMPHETAMINE-DEXTROAMPHETAMINE) one tab by mouth daily May fill in two months  #30 x 0   Entered and Authorized by:   Evelena Peat MD   Signed by:   Evelena Peat MD on 10/25/2010   Method used:   Print then Give to Patient   RxID:   [id number] ADDERALL XR 25 MG XR24H-CAP (AMPHETAMINE-DEXTROAMPHETAMINE) one  tab by mouth daily  #30 x 0   Entered and Authorized by:   Evelena Peat MD   Signed by:   Evelena Peat MD on 10/25/2010   Method used:   Print then Give to Patient   RxID:   [id number] ALPRAZOLAM 0.5 MG TABS (ALPRAZOLAM) one by mouth q 8 hours as needed  #60 x 0   Entered and Authorized by:   Evelena Peat MD   Signed by:   Evelena Peat MD on 10/25/2010   Method used:   Print then Give to Patient   RxID:   [id number]

## 2010-12-06 NOTE — Progress Notes (Signed)
Summary: Pt req refill of Adderall 10mg   Phone Note Refill Request Call back at Home Phone [phone] Message from:  Patient on July 30, 2010 8:24 AM  Refills Requested: Medication #1:  ADDERALL 10 MG TABS 1 once daily IN PM.   Dosage confirmed as above?Dosage Confirmed   Supply Requested: 1 month Pt only needs refill of the 10mg  Adderall.      Method Requested: Pick up at Office Initial call taken by: [name],  July 30, 2010 8:24 AM  Follow-up for Phone Call        Last dispensed on 8/20 Follow-up by: [name] LPN,  July 30, 2010 10:21 AM  Additional Follow-up for Phone Call Additional follow up Details #1::        will refill. Additional Follow-up by: Evelena Peat MD,  July 30, 2010 10:36 AM    Additional Follow-up for Phone Call Additional follow up Details #2::    Pt informed on personally identified VM, rx ready for pick-up Follow-up by: [name] LPN,  July 30, 2010 11:12 AM  Prescriptions: ADDERALL 10 MG TABS (AMPHETAMINE-DEXTROAMPHETAMINE) 1 once daily IN PM  #30 x 0   Entered and Authorized by:   Evelena Peat MD   Signed by:   Evelena Peat MD on 07/30/2010   Method used:   Print then Give to Patient   RxID:   [id number]

## 2010-12-06 NOTE — Progress Notes (Signed)
Summary: Omeprazole 20mg  order  Phone Note From Pharmacy   Caller: [facility]. #[id number]* Summary of Call: PA faxed from pt pharmacy for Nexium.  Dr Caryl Never wanted pt to try Omepraxole 20mg  by mouth daily and if not an adequate response, we can try Nexium PA at that time. Initial call taken by: [name] LPN,  April 23, 2010 4:54 PM    New/Updated Medications: OMEPRAZOLE 20 MG TBEC (OMEPRAZOLE) one tab  daily Prescriptions: OMEPRAZOLE 20 MG TBEC (OMEPRAZOLE) one tab  daily  #30 x 6   Entered by:   [name] LPN   Authorized by:   Evelena Peat MD   Signed by:   [name] LPN on 44/11/270   Method used:   Electronically to        [facility]. [phone]* (retail)       [phone]       [place], [place]  [address]       Ph: [phone]       Fax: [phone]   RxID:   [id number]

## 2010-12-06 NOTE — Progress Notes (Signed)
Summary: clairfy adderall  Phone Note Call from Patient   Caller: [facility] Call For: Dr. Clent Ridges Summary of Call: [facility] needs to know what strength Adderal this pt is supposed to be on?  She has different prescriptions that are being sent in. Dr. Clent Ridges sent in pres yesterday for 25. [phone] Initial call taken by: Lynann Beaver CMA,  August 09, 2010 10:10 AM  Follow-up for Phone Call        she takes XR 25 mg every morning, but she also has 10 mg to use in the evenings as needed Follow-up by: Nelwyn Salisbury MD,  August 10, 2010 8:39 AM  Additional Follow-up for Phone Call Additional follow up Details #1::        spoke with pharmacist - gave clarification Additional Follow-up by: Duard Brady LPN,  August 10, 2010 10:15 AM

## 2010-12-06 NOTE — Progress Notes (Signed)
Summary: nos appt  Phone Note Call from Patient   Caller: [name]  Call For: [name] Summary of Call: LMTCB x2 to rsc nos from 6/20. Initial call taken by: Darletta Moll,  April 24, 2010 3:02 PM

## 2010-12-06 NOTE — Progress Notes (Signed)
Summary: Adderall XR one month only  Phone Note Call from Patient Call back at [facility] Phone [phone]   Caller: Patient Call For: Evelena Peat MD Summary of Call: pt needs new rx adderall xr 25 mg Initial call taken by: [name],  August 08, 2010 9:17 AM  Follow-up for Phone Call        done Follow-up by: Nelwyn Salisbury MD,  August 08, 2010 2:20 PM  Additional Follow-up for Phone Call Additional follow up Details #1::        Pt informed one Rx available for pick-up, done one month only in Dr Mar Daring absence Additional Follow-up by: [name] LPN,  August 08, 2010 4:18 PM    New/Updated Medications: ADDERALL XR 25 MG XR24H-CAP (AMPHETAMINE-DEXTROAMPHETAMINE) one  tab by mouth daily Prescriptions: ADDERALL XR 25 MG XR24H-CAP (AMPHETAMINE-DEXTROAMPHETAMINE) one  tab by mouth daily  #30 x 0   Entered and Authorized by:   Nelwyn Salisbury MD   Signed by:   Nelwyn Salisbury MD on 08/08/2010   Method used:   Print then Give to Patient   RxID:   [id number]

## 2010-12-06 NOTE — Progress Notes (Signed)
Summary: LMTCB, ? about her rx Adderall  Phone Note Call from Patient Call back at Home Phone [phone]   Caller: Patient--live call Summary of Call: wants Harriett Sine to return call about her Adderall. Going out of town for 8 days and has many questions reqarding this. Initial call taken by: Warnell Forester,  Apr 03, 2010 2:51 PM  Follow-up for Phone Call        [facility] LPN  April 04, 4781 9:55 AM  LMTCB, pt had OV today at 1:30pm.  Pt was a no show, LMTCB to clarify reason, explaining $50.00 no show fee.  Follow-up by: [name] LPN,  April 05, 9561 2:58 PM

## 2010-12-06 NOTE — Progress Notes (Signed)
Summary: NEEDS ADDERALL 20 MG ONE TIME RX  Phone Note Call from Patient Call back at Home Phone [phone]   Caller: Patient Call For: [name] Peat MD Summary of Call: PT LOST ALL HER MEDS ALSO ADDERALL 25 MG SHE WOULD LIKE ADDERALL 20 MG DUE TO COST OF PAYING OUT OF POCKET.SHE WAS HELPING HER SISTER MOVE. Initial call taken by: [name],  July 18, 2010 9:41 AM  Follow-up for Phone Call        clarify.  Is she asking for short acting 20 mg vs xr? Follow-up by: [name] Peat MD,  July 18, 2010 12:41 PM  Additional Follow-up for Phone Call Additional follow up Details #1::        LMTCB t clarify exactly what she is needing, a loser dose and not the extended release? [name] LPN  July 18, 2010 3:55 PM      Additional Follow-up for Phone Call Additional follow up Details #2::    Pt called back and said that she is req Adderall 20mg  XR.  Pt said that the 25mg  dose is too expensive, because of her losing the med and insurance will not cover to replace it. Pt said all her other meds have been replaced, she just needs the Adderall XR 20mg .   Pts number is [phone]  Follow-up by: [name],  July 19, 2010 9:19 AM  Additional Follow-up for Phone Call Additional follow up Details #3:: Details for Additional Follow-up Action Taken: Pt has called back again to check on status of the Adderall 20mg  XR. Pt req to pick this up today. Pls call when ready.   Written [name] Peat MD  July 20, 2010 12:49 PM Pt informed ready for pick-up [name] LPN  July 20, 2010 2:35 PM   Additional Follow-up by: [name],  July 20, 2010 9:03 AM  New/Updated Medications: ADDERALL XR 20 MG XR24H-CAP (AMPHETAMINE-DEXTROAMPHETAMINE) one by mouth once daily Prescriptions: ADDERALL XR 20 MG XR24H-CAP (AMPHETAMINE-DEXTROAMPHETAMINE) one by mouth once daily  #30 x 0   Entered and Authorized by:   [name] Peat MD   Signed by:    [name] Peat MD on 07/20/2010   Method used:   Print then Give to Patient   RxID:   [id number]

## 2010-12-06 NOTE — Progress Notes (Signed)
Summary: Pt req script for Adderall 20mg  for pickup on 11/30/10  Phone Note Refill Request Call back at Home Phone [phone] Message from:  Patient on November 26, 2010 12:00 PM  Refills Requested: Medication #1:  ADDERALL 20 MG TABS one by mouth q pm   Supply Requested: 1 month  Method Requested: Pick up at Office on 11/30/10  Initial call taken by: [name],  November 26, 2010 12:00 PM  Follow-up for Phone Call        Last filled 12/27 [name] LPN  November 26, 2010 12:28 PM  refill Follow-up by: Evelena Peat MD,  November 29, 2010 1:06 PM  Additional Follow-up for Phone Call Additional follow up Details #1::        Pt informed Rx ready for pick-up Additional Follow-up by: [name] LPN,  November 29, 2010 1:16 PM    Prescriptions: ADDERALL 20 MG TABS (AMPHETAMINE-DEXTROAMPHETAMINE) one by mouth q pm  #30 x 0   Entered and Authorized by:   Evelena Peat MD   Signed by:   Evelena Peat MD on 11/29/2010   Method used:   Print then Give to Patient   RxID:   [id number]

## 2010-12-06 NOTE — Progress Notes (Signed)
Summary: Pt lost written script for Adderall 10mg .  Phone Note Refill Request Call back at Home Phone [phone] Message from:  Patient on May 25, 2010 8:03 AM  Refills Requested: Medication #1:  ADDERALL 10 MG TABS 1 once daily IN PM.   Dosage confirmed as above?Dosage Confirmed Pt can not find her script for Adderall 10mg . Pt is req a new script.       Method Requested: Pick up at Office Initial call taken by: [name],  May 25, 2010 8:05 AM  Follow-up for Phone Call        Pt was given 3 scripts on 04/25/2010 [name] LPN  May 25, 2010 9:29 AM   Additional Follow-up for Phone Call Additional follow up Details #1::        Pt called back and said that she rcvd 3 scripts for 25mg , but not for the 10mg .   Additional Follow-up by: [name],  May 25, 2010 3:39 PM    Additional Follow-up for Phone Call Additional follow up Details #2::    will refill. Follow-up by: Evelena Peat MD,  May 28, 2010 9:54 AM  Additional Follow-up for Phone Call Additional follow up Details #3:: Details for Additional Follow-up Action Taken: Rx ready for pick-up, pt aware Additional Follow-up by: [name] LPN,  May 28, 2010 3:09 PM  Prescriptions: ADDERALL 10 MG TABS (AMPHETAMINE-DEXTROAMPHETAMINE) 1 once daily IN PM  #30 x 0   Entered and Authorized by:   Evelena Peat MD   Signed by:   Evelena Peat MD on 05/28/2010   Method used:   Print then Give to Patient   RxID:   [id number]

## 2010-12-11 ENCOUNTER — Telehealth: Payer: Self-pay | Admitting: Family Medicine

## 2010-12-11 MED ORDER — AMPHETAMINE-DEXTROAMPHET ER 25 MG PO CP24: 25.0000 mg | ORAL_CAPSULE | Freq: Every day | ORAL | Status: DC

## 2010-12-11 NOTE — Telephone Encounter (Signed)
Pt called and is req script for Adderall XR 25mg . Pt says that she is flying out of [place] this after[place] 2pm and would like to pick up script then, if not she will pick up when she returns.

## 2010-12-11 NOTE — Telephone Encounter (Signed)
Pt informed on VM, RX ready for pick-up

## 2010-12-11 NOTE — Telephone Encounter (Signed)
Last filled 11-08-2010

## 2010-12-11 NOTE — Telephone Encounter (Signed)
Will refill.

## 2010-12-12 NOTE — Progress Notes (Signed)
Summary: Thinking about stopping Adderall  Phone Note Call from Patient   Caller: Patient Call For: Evelena Peat MD Summary of Call: VM from pt after she just picked up her Adderall.  She is thinking about stopping the Adderall.  If Dr Caryl Never agrees, how should she do this?  Does she need OV to discuss.  She has only one pill left, so should she have the new script filled? Initial call taken by: [name] LPN,  November 29, 2010 5:19 PM  Follow-up for Phone Call        Left another message today on pt personally identified VM [name] LPN  November 30, 2010 3:03 PM  Left another message on the work phone [name] LPN  November 30, 2010 5:38 PM  Left another message asking her to clarify her request.   Follow-up by: [name] LPN,  December 04, 2010 1:52 PM

## 2010-12-19 ENCOUNTER — Encounter: Payer: Self-pay | Admitting: Internal Medicine

## 2010-12-19 ENCOUNTER — Telehealth (INDEPENDENT_AMBULATORY_CARE_PROVIDER_SITE_OTHER): Payer: Self-pay | Admitting: *Deleted

## 2010-12-19 ENCOUNTER — Ambulatory Visit (INDEPENDENT_AMBULATORY_CARE_PROVIDER_SITE_OTHER): Payer: 59 | Admitting: Internal Medicine

## 2010-12-19 DIAGNOSIS — J45909 Unspecified asthma, uncomplicated: Secondary | ICD-10-CM

## 2010-12-19 DIAGNOSIS — F411 Generalized anxiety disorder: Secondary | ICD-10-CM

## 2010-12-26 NOTE — Assessment & Plan Note (Signed)
Summary: COUGH, HARD TO BREATHE/KW/LED   Primary Provider/Referring Provider:  [facility]  CC:  Acute visit-cough-dark yellow in color; wheezing and headache(BP elevated-had not taken BP med this morning)..  History of Present Illness: October 05, 2009- Asthma, ? panic/ anxiety?, GERD ER twice- once for asthma, then back with severe reflux after they gave prednisone. She is treating this otc and doing better. Has PF meter- not used. Has started walking regularly. Daily feels tightest on waking. Persistent cough and daily mucus- white sticky mucus. We discussed her previous lack of success with psychologist for her anxiety depression. She still is struggling with divorce/ child issues. Spiriva does help. Had flu vax.  Reviewed PFT- reassured her it was normal- not COPD which she was focused on.  December 19, 2010-  Asthma, ? panic/ anxiety?, GERD Nurse-CC: Acute visit-cough-dark yellow in color; wheezing, headache(BP elevated-had not taken BP med this morning). Acute visit- Sick for 2 weeks URI/ bronchitis pattern. Went to ER about 10 days ago, given prednisone taper which did help a lot. sharp muscle twinges. Denies fever or sore throat. Global HA today. Coughing yellow. Nausea yesterday. Denies pregnant. Today gave neb at home and took plain mucinex. Says she is having panic because of this and asks refill xanax given by Dr Caryl Never 12/22. Using rescue inhaler up to 3-4 times / day.    Asthma History    Initial Asthma Severity Rating:    Age range: 12+ years    Symptoms: daily    Nighttime Awakenings: 0-2/month    Interferes w/ normal activity: minor limitations    SABA use (not for EIB): daily    Asthma Severity Assessment: Moderate Persistent   Preventive Screening-Counseling & Management  Alcohol-Tobacco     Alcohol drinks/day: 0     Smoking Status: never  Current Medications (verified): 1)  Ventolin Hfa 108 (90 Base) Mcg/act Aers (Albuterol Sulfate) .... As Needed 2)   Hydrochlorothiazide 25 Mg Tabs (Hydrochlorothiazide) .... One Half Tablet Once Daily 3)  Symbicort 160-4.5 Mcg/act Aero (Budesonide-Formoterol Fumarate) .... One Puff Every Morning 4)  Spiriva Handihaler 18 Mcg Caps (Tiotropium Bromide Monohydrate) .Marland Kitchen.. 1 Daily 5)  Omeprazole 20 Mg Tbec (Omeprazole) .... One Tab  Daily 6)  Fluoxetine Hcl 20 Mg Caps (Fluoxetine Hcl) .... One By Mouth Once Daily 7)  Alprazolam 0.5 Mg Tabs (Alprazolam) .... One By Mouth Q 8 Hours As Needed 8)  Benicar 20 Mg Tabs (Olmesartan Medoxomil) .... One By Mouth Once Daily 9)  Ipratropium-Albuterol 0.5-2.5 (3) Mg/24ml Soln (Ipratropium-Albuterol) .... Inhale Contents of 1 Viall in Nebulizer As Needed For Asthma 10)  Adderall Xr 25 Mg Xr24h-Cap (Amphetamine-Dextroamphetamine) .... One By Mouth Once Daily May Refill in Two Months  Allergies (verified): 1)  ! Aspirin (Aspirin) 2)  Codeine Sulfate (Codeine Sulfate) 3)  Hydrocodone-Acetaminophen (Hydrocodone-Acetaminophen)  Past History:  Past Surgical History: Last updated: 05/25/2009 Appendectomy 1994 Tonsillectomy  1973  Family History: Last updated: 08/11/2009 Family History of Alcoholism/Addiction, parent, blood relative Family History Ovarian cancer, grandmother Family history breast cancer, grandmother Family History Hypertension Family history emotional illness Family history diabetes, blood relative Son with asthma and nut allergy  Social History: Last updated: 08/11/2009 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   Risk Factors: Alcohol Use: 0 (12/19/2010) Caffeine Use: less than 1 a day (02/13/2010) Exercise: no (02/13/2010)  Risk Factors: Smoking Status: never (12/19/2010)  Past Medical History: Depression Anxiety Hypertension Asthma GERD Hepatitis A Jaundice Blood transfusion Migraines UTI  Review of  Systems      See HPI       The patient complains of shortness of breath with activity, shortness of  breath at rest, non-productive cough, headaches, anxiety, depression, and change in color of mucus.  The patient denies coughing up blood, chest pain, irregular heartbeats, acid heartburn, indigestion, loss of appetite, weight change, abdominal pain, difficulty swallowing, sore throat, tooth/dental problems, nasal congestion/difficulty breathing through nose, sneezing, rash, and fever.    Vital Signs:  Patient profile:   45 year old female Menstrual status:  regular Height:      65 inches O2 Sat:      96 % on Room air Pulse rate:   78 / minute BP sitting:   158 / 92  (left arm) Cuff size:   regular  Vitals Entered By: [name] CMA (December 19, 2010 10:05 AM)  O2 Flow:  Room air CC: Acute visit-cough-dark yellow in color; wheezing, headache(BP elevated-had not taken BP med this morning).   Physical Exam  Additional Exam:  General: A/Ox3; pleasant and cooperative, NAD ,talkative, pressured speech, calmed with distraction SKIN: no rash, lesions NODES: no lymphadenopathy HEENT: [place]/AT, EOM- WNL, Conjuctivae- clear, PERRLA, TM-WNL, Nose- clear, Throat- clear and wnl, Mallampatii II NECK: Supple w/ fair ROM, JVD- none, normal carotid impulses w/o bruits Thyroid-  CHEST: Bilateral wheeze and cough. Full sentences, no a ccessory use.  HEART: RRR, no m/g/r heard ABDOMEN: Soft and nl; ZOX:WRUE, nl pulses, no edema  NEURO: Grossly intact to observation      Impression & Recommendations:  Problem # 1:  ASTHMA (ICD-493.90) Acute exacerbation of asthma- viral trigger most likely. We discussed options.  Plan neb, depo, pred taper, Z pak  Problem # 2:  ANXIETY (ICD-300.00)  I agreed to one-time refill of xanax.  Her updated medication list for this problem includes:    Fluoxetine Hcl 20 Mg Caps (Fluoxetine hcl) ..... One by mouth once daily    Alprazolam 0.5 Mg Tabs (Alprazolam) ..... One by mouth q 8 hours as needed  Problem # 3:  GERD (ICD-530.81) Reviewed symptoms and medical  concerns of GERD as a confounder of asthma.  Her updated medication list for this problem includes:    Omeprazole 20 Mg Tbec (Omeprazole) ..... One tab  daily  Medications Added to Medication List This Visit: 1)  Zithromax Z-pak 250 Mg Tabs (Azithromycin) .... 2 today then one daily 2)  Prednisone 10 Mg Tabs (Prednisone) .Marland Kitchen.. 1 tab four times daily x 2 days, 3 times daily x 2 days, 2 times daily x 2 days, 1 time daily x 2 days  Other Orders: Est. Patient Level III (45409)  Patient Instructions: 1)  Please schedule a follow-up appointment in 2 months. 2)  Continue Symbicort twice daily , with Proair if needed 3)  Neb a 4)  depo 80 5)  script refill xanax 6)  scripts for prednisone taper and Z pak  sent to drug store Prescriptions: PREDNISONE 10 MG TABS (PREDNISONE) 1 tab four times daily x 2 days, 3 times daily x 2 days, 2 times daily x 2 days, 1 time daily x 2 days  #20 x 0   Entered and Authorized by:   Waymon Budge MD   Signed by:   Waymon Budge MD on 12/19/2010   Method used:   Electronically to        [facility]. [phone]* (retail)       [phone]  [place], [place]  [address]       Ph: [phone]       Fax: [phone]   RxID:   [id number] ZITHROMAX Z-PAK 250 MG TABS (AZITHROMYCIN) 2 today then one daily  #1 pak x 0   Entered and Authorized by:   Waymon Budge MD   Signed by:   Waymon Budge MD on 12/19/2010   Method used:   Print then Give to Patient   RxID:   [id number] ALPRAZOLAM 0.5 MG TABS (ALPRAZOLAM) one by mouth q 8 hours as needed  #60 x 0   Entered and Authorized by:   Waymon Budge MD   Signed by:   Waymon Budge MD on 12/19/2010   Method used:   Print then Give to Patient   RxID:   [id number]

## 2010-12-26 NOTE — Progress Notes (Signed)
Summary: pt sick-req appt. patient is now in the lobby  Phone Note Call from Patient Call back at [facility] Phone [phone]   Caller: Patient Call For: young Reason for Call: Talk to Nurse Summary of Call: Patient c/o productive cough, dark phlegm, having hard time breathing x 1 week.  Patient requesting appt. Initial call taken by: Lehman Prom,  December 19, 2010 8:20 AM  Follow-up for Phone Call        patient came in stated that she had been waiting in the parking lot and her phone went dead so she decided to come in to see if we had her a work in appt yet. Patient is in the lobby.Vedia Coffer  December 19, 2010 9:33 AM  Katie please advise where to put this pt on CY schedule. Zackery Barefoot CMA  December 19, 2010 9:34 AM   Additional Follow-up for Phone Call Additional follow up Details #1::        Pt put on CDY's schedule at 1015am today.[name] CMA  December 19, 2010 9:49 AM

## 2010-12-27 ENCOUNTER — Ambulatory Visit (INDEPENDENT_AMBULATORY_CARE_PROVIDER_SITE_OTHER): Payer: 59 | Admitting: Family Medicine

## 2010-12-27 ENCOUNTER — Encounter: Payer: Self-pay | Admitting: Family Medicine

## 2010-12-27 VITALS — BP 116/80 | HR 76 | Temp 98.3°F | Resp 14

## 2010-12-27 DIAGNOSIS — F988 Other specified behavioral and emotional disorders with onset usually occurring in childhood and adolescence: Secondary | ICD-10-CM

## 2010-12-27 DIAGNOSIS — I10 Essential (primary) hypertension: Secondary | ICD-10-CM

## 2010-12-27 DIAGNOSIS — J45909 Unspecified asthma, uncomplicated: Secondary | ICD-10-CM

## 2010-12-27 MED ORDER — PREDNISONE 10 MG PO TABS: 10.0000 mg | ORAL_TABLET | Freq: Every day | ORAL | Status: AC

## 2010-12-27 MED ORDER — AMPHETAMINE-DEXTROAMPHETAMINE 20 MG PO TABS: 20.0000 mg | ORAL_TABLET | Freq: Every day | ORAL | Status: DC

## 2010-12-27 NOTE — Progress Notes (Signed)
  Subjective:    Patient ID: [name], female    DOB: 06-15-1966, 45 y.o.   MRN: [id number]  HPI  Patient seen with the following issues   History of asthma. Recent exacerbation probably triggered by viral process about 3 weeks ago. Went to emergency room and was given prednisone and nebulizer with some mild improvement. Continued productive cough and saw pulmonologist recently and started Zithromax, Depo-Medrol, and another prednisone taper. She has improved somewhat but has continued cough, mostly nonproductive. She is using all of her usual inhalers regularly and as instructed. No recent fever. No dyspnea at rest. Also taking over-the-counter Mucinex and drinking clear fluids. She has significant anxiety with upcoming trip with flight out of town  Fear of asthma exacerbation.   history of attention deficit disorder. Requesting refill of Adderall. She takes extended-release 25 mg in the morning and supplements with 20 mg immediate release midafternoon. No adverse side effects. No history of misuse.    hypertension treated with Benicar 20 mg daily. Blood pressure stable and compliant with therapy. No orthostasis.   Review of Systems  as per history of present illness    Objective:   Physical Exam  patient is alert and in no distress. Intermittent coughing during exam.  Oropharynx is clear Eardrums no acute changes Chest reveals some faint expiratory wheezes. No rales. No retractions. Heart regular rhythm and rate Extremities no edema       Assessment & Plan:   #1 asthma with recent exacerbation. She'll continue current prednisone taper. We wrote for a few more prednisone tablets to taper and start on upcoming trip out of town only if necessary.  #2 attention deficit disorder. Refilled immediately to Adderall 20 mg #30 tablets with no refill  #3 hypertension stable samples provided for Benicar 20 mg daily

## 2011-01-21 LAB — POCT I-STAT, CHEM 8
BUN: 7 mg/dL (ref 6–23)
Calcium, Ion: 1.1 mmol/L — ABNORMAL LOW (ref 1.12–1.32)
Chloride: 107 mEq/L (ref 96–112)
Creatinine, Ser: 0.6 mg/dL (ref 0.4–1.2)
Glucose, Bld: 144 mg/dL — ABNORMAL HIGH (ref 70–99)
HCT: 42 % (ref 36.0–46.0)
Hemoglobin: 14.3 g/dL (ref 12.0–15.0)
Potassium: 3.4 mEq/L — ABNORMAL LOW (ref 3.5–5.1)
Sodium: 141 mEq/L (ref 135–145)
TCO2: 24 mmol/L (ref 0–100)

## 2011-01-21 LAB — POCT I-STAT 3, ART BLOOD GAS (G3+)
Acid-base deficit: 2 mmol/L (ref 0.0–2.0)
Bicarbonate: 21.6 mEq/L (ref 20.0–24.0)
O2 Saturation: 99 %
Patient temperature: 37
TCO2: 23 mmol/L (ref 0–100)
pCO2 arterial: 33.2 mmHg — ABNORMAL LOW (ref 35.0–45.0)
pH, Arterial: 7.421 — ABNORMAL HIGH (ref 7.350–7.400)
pO2, Arterial: 118 mmHg — ABNORMAL HIGH (ref 80.0–100.0)

## 2011-01-21 LAB — CBC
HCT: 38.5 % (ref 36.0–46.0)
HCT: 40.2 % (ref 36.0–46.0)
Hemoglobin: 13.2 g/dL (ref 12.0–15.0)
Hemoglobin: 13.8 g/dL (ref 12.0–15.0)
MCHC: 34.3 g/dL (ref 30.0–36.0)
MCHC: 34.3 g/dL (ref 30.0–36.0)
MCV: 88.2 fL (ref 78.0–100.0)
MCV: 89 fL (ref 78.0–100.0)
Platelets: 295 10*3/uL (ref 150–400)
Platelets: 300 10*3/uL (ref 150–400)
RBC: 4.32 MIL/uL (ref 3.87–5.11)
RBC: 4.56 MIL/uL (ref 3.87–5.11)
RDW: 13 % (ref 11.5–15.5)
RDW: 13.1 % (ref 11.5–15.5)
WBC: 10.7 10*3/uL — ABNORMAL HIGH (ref 4.0–10.5)
WBC: 17.2 10*3/uL — ABNORMAL HIGH (ref 4.0–10.5)

## 2011-01-21 LAB — POCT CARDIAC MARKERS
CKMB, poc: <1 ng/mL — ABNORMAL LOW (ref 1.0–8.0)
CKMB, poc: <1 ng/mL — ABNORMAL LOW (ref 1.0–8.0)
Myoglobin, poc: 43.6 ng/mL (ref 12–200)
Myoglobin, poc: 60.9 ng/mL (ref 12–200)
Troponin i, poc: <0.05 ng/mL (ref 0.00–0.09)
Troponin i, poc: <0.05 ng/mL (ref 0.00–0.09)

## 2011-01-21 LAB — BASIC METABOLIC PANEL
BUN: 7 mg/dL (ref 6–23)
CO2: 23 mEq/L (ref 19–32)
Calcium: 9 mg/dL (ref 8.4–10.5)
Chloride: 109 mEq/L (ref 96–112)
Creatinine, Ser: 0.68 mg/dL (ref 0.4–1.2)
GFR calc Af Amer: >60 mL/min (ref >60)
GFR calc non Af Amer: >60 mL/min (ref >60)
Glucose, Bld: 139 mg/dL — ABNORMAL HIGH (ref 70–99)
Potassium: 4.4 mEq/L (ref 3.5–5.1)
Sodium: 138 mEq/L (ref 135–145)

## 2011-01-21 LAB — DIFFERENTIAL
Basophils Absolute: 0.1 10*3/uL (ref 0.0–0.1)
Basophils Relative: 1 % (ref 0–1)
Eosinophils Absolute: 0.5 10*3/uL (ref 0.0–0.7)
Eosinophils Relative: 4 % (ref 0–5)
Lymphocytes Relative: 22 % (ref 12–46)
Lymphs Abs: 2.4 10*3/uL (ref 0.7–4.0)
Monocytes Absolute: 0.9 10*3/uL (ref 0.1–1.0)
Monocytes Relative: 8 % (ref 3–12)
Neutro Abs: 6.9 10*3/uL (ref 1.7–7.7)
Neutrophils Relative %: 65 % (ref 43–77)

## 2011-01-21 LAB — PROCALCITONIN: Procalcitonin: <0.5 ng/mL

## 2011-01-21 LAB — D-DIMER, QUANTITATIVE: D-Dimer, Quant: 0.75 ug/mL-FEU — ABNORMAL HIGH (ref 0.00–0.48)

## 2011-02-06 LAB — POCT CARDIAC MARKERS
CKMB, poc: <1 ng/mL — ABNORMAL LOW (ref 1.0–8.0)
CKMB, poc: <1 ng/mL — ABNORMAL LOW (ref 1.0–8.0)
Myoglobin, poc: 49.4 ng/mL (ref 12–200)
Myoglobin, poc: 52.4 ng/mL (ref 12–200)
Troponin i, poc: <0.05 ng/mL (ref 0.00–0.09)
Troponin i, poc: <0.05 ng/mL (ref 0.00–0.09)

## 2011-02-06 LAB — CBC
HCT: 40.7 % (ref 36.0–46.0)
Hemoglobin: 14 g/dL (ref 12.0–15.0)
MCHC: 34.3 g/dL (ref 30.0–36.0)
MCV: 90 fL (ref 78.0–100.0)
Platelets: 299 10*3/uL (ref 150–400)
RBC: 4.52 MIL/uL (ref 3.87–5.11)
RDW: 13.2 % (ref 11.5–15.5)
WBC: 16.9 10*3/uL — ABNORMAL HIGH (ref 4.0–10.5)

## 2011-02-06 LAB — BASIC METABOLIC PANEL
BUN: 18 mg/dL (ref 6–23)
CO2: 26 mEq/L (ref 19–32)
Calcium: 8.6 mg/dL (ref 8.4–10.5)
Chloride: 99 mEq/L (ref 96–112)
Creatinine, Ser: 1.24 mg/dL — ABNORMAL HIGH (ref 0.4–1.2)
GFR calc Af Amer: 57 mL/min — ABNORMAL LOW (ref >60)
GFR calc non Af Amer: 47 mL/min — ABNORMAL LOW (ref >60)
Glucose, Bld: 148 mg/dL — ABNORMAL HIGH (ref 70–99)
Potassium: 3.5 mEq/L (ref 3.5–5.1)
Sodium: 133 mEq/L — ABNORMAL LOW (ref 135–145)

## 2011-02-06 LAB — DIFFERENTIAL
Basophils Absolute: 0 10*3/uL (ref 0.0–0.1)
Basophils Relative: 0 % (ref 0–1)
Eosinophils Absolute: 0 10*3/uL (ref 0.0–0.7)
Eosinophils Relative: 0 % (ref 0–5)
Lymphocytes Relative: 10 % — ABNORMAL LOW (ref 12–46)
Lymphs Abs: 1.7 10*3/uL (ref 0.7–4.0)
Monocytes Absolute: 0.5 10*3/uL (ref 0.1–1.0)
Monocytes Relative: 3 % (ref 3–12)
Neutro Abs: 14.6 10*3/uL — ABNORMAL HIGH (ref 1.7–7.7)
Neutrophils Relative %: 87 % — ABNORMAL HIGH (ref 43–77)

## 2011-02-06 LAB — D-DIMER, QUANTITATIVE: D-Dimer, Quant: 0.4 ug/mL-FEU (ref 0.00–0.48)

## 2011-02-08 ENCOUNTER — Other Ambulatory Visit: Payer: Self-pay | Admitting: Family Medicine

## 2011-02-12 ENCOUNTER — Emergency Department (HOSPITAL_COMMUNITY)
Admission: EM | Admit: 2011-02-12 | Discharge: 2011-02-12 | Disposition: A | Discharge status: Home / Self Care | Payer: Self-pay | Attending: Emergency Medicine | Admitting: Emergency Medicine

## 2011-02-12 DIAGNOSIS — J45909 Unspecified asthma, uncomplicated: Secondary | ICD-10-CM | POA: Insufficient documentation

## 2011-02-12 DIAGNOSIS — I10 Essential (primary) hypertension: Secondary | ICD-10-CM | POA: Insufficient documentation

## 2011-02-12 DIAGNOSIS — F988 Other specified behavioral and emotional disorders with onset usually occurring in childhood and adolescence: Secondary | ICD-10-CM | POA: Insufficient documentation

## 2011-02-12 DIAGNOSIS — F411 Generalized anxiety disorder: Secondary | ICD-10-CM | POA: Insufficient documentation

## 2011-02-12 LAB — POCT I-STAT, CHEM 8
BUN: 9 mg/dL (ref 6–23)
Calcium, Ion: 1.1 mmol/L — ABNORMAL LOW (ref 1.12–1.32)
Chloride: 107 mEq/L (ref 96–112)
Creatinine, Ser: 1.1 mg/dL (ref 0.4–1.2)
Glucose, Bld: 94 mg/dL (ref 70–99)
HCT: 45 % (ref 36.0–46.0)
Hemoglobin: 15.3 g/dL — ABNORMAL HIGH (ref 12.0–15.0)
Potassium: 3.6 mEq/L (ref 3.5–5.1)
Sodium: 140 mEq/L (ref 135–145)
TCO2: 23 mmol/L (ref 0–100)

## 2011-02-12 LAB — DIFFERENTIAL
Basophils Absolute: 0 10*3/uL (ref 0.0–0.1)
Basophils Relative: 0 % (ref 0–1)
Eosinophils Absolute: 0.1 10*3/uL (ref 0.0–0.7)
Eosinophils Relative: 1 % (ref 0–5)
Lymphocytes Relative: 22 % (ref 12–46)
Lymphs Abs: 2.6 10*3/uL (ref 0.7–4.0)
Monocytes Absolute: 0.6 10*3/uL (ref 0.1–1.0)
Monocytes Relative: 5 % (ref 3–12)
Neutro Abs: 8.7 10*3/uL — ABNORMAL HIGH (ref 1.7–7.7)
Neutrophils Relative %: 72 % (ref 43–77)

## 2011-02-12 LAB — CBC
HCT: 42.2 % (ref 36.0–46.0)
Hemoglobin: 13.6 g/dL (ref 12.0–15.0)
MCHC: 32.2 g/dL (ref 30.0–36.0)
MCV: 88.3 fL (ref 78.0–100.0)
Platelets: 316 10*3/uL (ref 150–400)
RBC: 4.78 MIL/uL (ref 3.87–5.11)
RDW: 13.4 % (ref 11.5–15.5)
WBC: 12 10*3/uL — ABNORMAL HIGH (ref 4.0–10.5)

## 2011-02-12 LAB — POCT PREGNANCY, URINE: Preg Test, Ur: NEGATIVE

## 2011-02-12 LAB — URINE MICROSCOPIC-ADD ON

## 2011-02-12 LAB — GLUCOSE, CAPILLARY: Glucose-Capillary: 104 mg/dL — ABNORMAL HIGH (ref 70–99)

## 2011-02-12 LAB — URINALYSIS, ROUTINE W REFLEX MICROSCOPIC
Bilirubin Urine: NEGATIVE
Glucose, UA: NEGATIVE mg/dL
Leukocytes, UA: NEGATIVE
Nitrite: NEGATIVE
Protein, ur: NEGATIVE mg/dL
Specific Gravity, Urine: 1.01 (ref 1.005–1.030)
Urobilinogen, UA: 0.2 mg/dL (ref 0.0–1.0)
pH: 6 (ref 5.0–8.0)

## 2011-03-05 ENCOUNTER — Emergency Department (HOSPITAL_COMMUNITY)
Admission: EM | Admit: 2011-03-05 | Discharge: 2011-03-05 | Discharge status: Against Medical Advice | Payer: Self-pay | Attending: Emergency Medicine | Admitting: Emergency Medicine

## 2011-03-05 DIAGNOSIS — R51 Headache: Secondary | ICD-10-CM | POA: Insufficient documentation

## 2011-03-05 DIAGNOSIS — H538 Other visual disturbances: Secondary | ICD-10-CM | POA: Insufficient documentation

## 2011-03-14 ENCOUNTER — Telehealth: Payer: Self-pay | Admitting: Internal Medicine

## 2011-03-14 NOTE — Telephone Encounter (Signed)
Spoke w/ pt and is aware sample of symb 160 was left upfront for pick up. Pt has an apt scheduled 6/20 at 9:00 and is aware of apt date and time of apt. Nothing further was needed

## 2011-03-22 NOTE — Discharge Summary (Signed)
[facility]  Patient:    [name], [name]                  MRN: [id number] Adm. Date:  [id number] Disc. Date: 12/19/00 Attending:  Avie Echevaria CC:         Duke Salvia. Marcelle Overlie, M.D.   Discharge Summary  FINAL DIAGNOSES: 1. Status asthmaticus in the setting of purulent tracheobronchitis with normal    chest x-ray. 2. 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  HISTORY OF PRESENT ILLNESS:  This is a very nice, but complicated, 45 year old white female, never smoker, with chronic asthma, a history of non-adherance, plus question of vocal cord dysfunction syndrome.  She apparently had never been able to wean herself from daily use of albuterol, and came to the office on November 27, 2000 with acute exacerbation of asthma in the setting of purulent tracheobronchitis.  She was admitted with pan expiratory wheezing that failed to respond to nebulizer treatment, and was therefore felt to have status asthmaticus.  Her chest x-ray was clear, saturations were fine, and her laboratory work was unremarkable.  She proved tough to clear with IV Solu-Medrol, around-the-clock nebulizers, so she was given 24 hours of magnesium sulfate which did appear to improve her wheezing.  At the time of discharge she is wheeze-free, ambulatory, saturating well on room air.  Her condition is therefore improved.  She will be discharged today for followup within a week on the following regimen:  DISCHARGE MEDICATIONS: 1. Pulmicort 8 puffs b.i.d. (DPI demonstrated today and is adequate). 2. Serevent 1 puff b.i.d. 3. Tequin 400 mg daily x 4 days (complete 7 days of therapy by December 23, 2000). 4. Protonix 40 mg one q.d. before breakfast. 5. If needed for cough she can use guaifenesin DM two q.12h. 6. For wheezing, use albuterol 2 puffs q.4h.  We will need to work with  her long-term to have her understand the importance of not overusing short-term beta-2 agonist to prevent recurrent hospitalizations during acute exacerbation of true asthma. DD:  12/19/00 TD:  12/19/00 Job: [id number]

## 2011-03-22 NOTE — H&P (Signed)
[facility]  Patient:    [name], [name]                  MRN: [id number] Adm. Date:  [id number] Attending:  Avie Echevaria                         History and Physical  CHIEF COMPLAINT:  Dyspnea.  HISTORY OF PRESENT ILLNESS:  This is a 45 year old white female, never smoker, with chronic asthma and a history of non-adherance to chronic suppressive therapy along with predominance of upper airways dysfunction, felt to be most likely reflux or idiopathic vocal cord dysfunction syndrome.  She, despite being warned about the dangers of overusing beta-2 agonists, she has never been able to get to less than using the albuterol on a daily basis, and comes in today to my office to see me with acute onset of dyspnea at rest associated with congestive cough of green sputum over the last 24 hours, associated with wheezing and chest tightness.  I saw her in the office with profound wheezing just after taking albuterol, and felt that she needed to be admitted.  A chest x-ray is pending at the time of this dictation.  The patient denies any fever or purulent sputum, overt sinus or reflux symptoms.  PAST MEDICAL HISTORY:  She has essentially been healthy except, has had two normal pregnancies, and is actually still breast-feeding.  ALLERGIES:  She denies any drug allergies, but states is ASPIRIN and ERYTHROMYCIN intolerant.  It is not clear whether this actually causes respiratory complaints, however.  MEDICATIONS:  She is supposed to be maintained on the combination of Pulmicort 4 puffs b.i.d., Serevent 1 puff b.i.d., and Prevacid 30 mg q.12h., but has not taken any of these medications consistently from what I gather from her.  SOCIAL HISTORY:  She has never smoked.  She denies any alcohol use.  FAMILY HISTORY:  Negative for asthma or atopy.  REVIEW OF SYSTEMS:  Taken in detail and essentially negative except as noted above.  PHYSICAL  EXAMINATION:  GENERAL:  This is a pleasant ambulatory white female with moderate increased work of breathing at rest.  VITAL SIGNS:  She is afebrile with normal vital signs.  HEENT:  Moderate turbinate edema was present.  Oropharynx revealed mild cobblestoning with muco and postnasal drainage.  She cleared her throat frequently during the interview and exam.  Dentition was intact.  Ear canals were clear bilaterally.  NECK:  Supple, without cervical adenopathy or tenderness.  Trachea midline with no thyromegaly.  LUNGS:  Fields revealed inspiratory and expiratory rhonchi that were quite harsh, associated with inspiratory coughing, severe expiratory coughing, and pseudowheezing during coughing peroxisomes.  HEART:  Regular rate and rhythm.  No murmurs, rubs, or gallops.  ABDOMEN:  Soft, benign, no palpable organomegaly or masses.  EXTREMITIES:  Warm without calf tenderness, clubbing, cyanosis, or edema.  NEUROLOGIC:  No focal deficits.  Pathologic reflexes.  SKIN:  Warm and dry.  LABORATORY DATA:  O2 saturation 95% on room air.  IMPRESSION:  Refractory asthma in the setting of pretracheal bronchitis, given the background of poorly controlled of upper airway dysfunction (possibly gastroesophageal reflux disease related), and overuse of beta-2 agonists.  We really have two problems here:  First, her baseline function is not adequate as she is overusing beta-2 agonists.  Now when she really needs them, she may well have down regulation of beta-2 receptors, and that is why she "falls  apart" with an episode of tracheal bronchitis, so she falls apart so rapidly with episode tracheal bronchitis.  Secondly, she has already used albuterol within the past hour and has sustained wheezing with upper and lower airway components.  Because she has shown poor insight in the past with a tendency to overuse beta-2 agonists and ended up in the emergency room, I am going to recommend that she be  admitted now for inpatient workup, which will include a chest x-ray and intense treatment with IV steroids as well as bronchodilators. In fact, she may need magnesium sulfate for refractory bronchospasm, so we will tentatively admit her to telemetry for this purpose. DD:  12/16/00 TD:  12/17/00 Job: [id number]

## 2011-03-22 NOTE — H&P (Signed)
[place]  Patient:    [name], [name]                  MRN: [id number] Adm. Date:  [id number] Attending:  Avie Echevaria CC:         Barbaraann Share, M.D. [facility]             Richard M. Marcelle Overlie, M.D.                         History and Physical  HISTORY OF PRESENT ILLNESS:  The patient is a 45 year old white female with intrauterine pregnancy at 35 weeks and a long history of asthma, who presents with status asthmaticus.  Patient described cold-like symptoms leading up to this, followed by increasing chest tightness and wheezing.  She was seen by Dr. Casimiro Needle B. Wert in the office yesterday and given Depo-Medrol.  She continued to worsen through the night and came to the emergency room.  In the emergency room, she was found to be in distress with diffuse wheezing and O2 saturations that were up and down.  She received IV Solu-Medrol, IV magnesium as well as continuous albuterol treatments but was borderline for relief.  Her O2 saturations with ambulation were 90%, at best.  Since she was pregnant and also borderline with er pulmonary status, she was admitted for further care.  PAST MEDICAL HISTORY:  Past medical history is significant for bronchial asthma, gastroesophageal reflux disease for which she takes Prevacid and has apparently  been approved by her OB/GYN and was verified by Dr. Lacretia Nicks. Varney Baas today.  She denies any major medical illnesses.  ALLERGIES:  She has no known drug allergies but she is ASPIRIN INTOLERANT.  MEDICATIONS: 1. Pulmicort two puffs b.i.d. 2. Serevent Diskus one b.i.d. 3. Albuterol p.r.n. for rescue. 4. The patient was on a multivitamin for her pregnancy but discontinued because of    reflux. 5. She is also on Prevacid 30 mg q.d.  SOCIAL HISTORY:  She has never smoked.  Again, she currently is 35-weeks pregnant.  FAMILY HISTORY:  Noncontributory.  REVIEW OF SYSTEMS:  As per history of present  illness.  PHYSICAL EXAMINATION:  GENERAL:  She is a well-developed white female in only mild respiratory distress at the time that I saw her.  VITAL SIGNS:  Blood pressure is 132/62, pulse 120, respiratory rate is 28.  She is afebrile.  O2 saturation is 93% on nasal oxygen.  HEENT:  Pupils equal, round and reactive to light and accommodation. Extraocular muscles are intact.  Nares are patent without discharge.  Oropharynx is clear.  NECK:  Supple, without JVD or lymphadenopathy.  No palpable thyromegaly.  CHEST:  Diffuse expiratory wheezing, which is mild, and a very large component f upper airway pseudo-wheezing.  CARDIAC:  Tachycardic but regular rhythm with mild hyperdynamic murmur.  ABDOMEN:  Consistent with an intrauterine pregnancy.  GENITAL:  Exam not done and not indicated.  RECTAL:  Exam not done and not indicated.  BREASTS:  Exam not done and not indicated.  EXTREMITIES:  Lower extremities are without edema.  Good pulses distally. There is no calf tenderness.  NEUROLOGIC:  She is alert and oriented x 3 with no obvious motor deficits.  X-RAY FINDINGS:  Pending at time of ______ :  Chest x-ray was not done due to the risks associated with the pregnancy and also low yield clinically.  IMPRESSION: 1. Bronchial asthma with status asthmaticus.  Patient is much improved with    aggressive treatment in the emergency room but she is not able to go home.    Given her desaturations and borderline respiratory status, it is probably the    better part of valor to admit her, in light of her intrauterine pregnancy. he    is agreeable to this. 2. Intrauterine pregnancy at 35 weeks.  I have contacted Dr. Jennette Kettle, who will let  Dr. Duke Salvia. [place] know.  Patient will fetal heart tones while in the    hospital.  PLAN: 1. Albuterol nebulizers and low-dose Solu-Medrol. 2. Nasal oxygen. 3. Ambulate. 4. Withhold antibiotics unless purulent mucus develops. 5. Check fetal  heart tones q.4-6h. DD:  01/26/00 TD:  01/26/00 Job: [id number]
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
## 2011-03-22 NOTE — H&P (Signed)
[facility]  Patient:    [name], [name] Visit Number: [id number] MRN: [id number]          Service Type: PSY Location: 300 0300 02 Attending Physician:  Rachael Fee Dictated by:   Netta Cedars, M.D. Admit Date:  08/16/2001 Discharge Date: 08/17/2001                     Psychiatric Admission Assessment  INTRODUCTION:  [name] was admitted on August 16, 2001.  Her initial diagnosis was major depression, moderate to severe, single episode. Admission note was written on October 13 and the elements of this note were incorporated into discharge summary, which contains all details pertinent to admission.  ADMISSION DIAGNOSES: Axis I:    1. Major depressive disorder, moderate to severe, single episode.            2. Post-traumatic stress disorder. 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 Axis III:  Asthma. Axis IV:   Severe (stress related to primary support group). Axis V:    Global Assessment of Functioning upon admission 25; maximum for            past year 60.  INITIAL TREATMENT PLAN:  Contracted for safety while on the unit.  Individual and group therapy.  Started patient on trial with Lexapro, which side effects were explained.  Arranged a meeting with patients family and considering intensive outpatient treatment after discharge from inpatient unit.  Upon admission, patient was transferred to service of Dr. Kathrynn Running, who is supposed to see her on August 17, 2001.  See discharge note for details. Dictated by:   Netta Cedars, M.D. Attending Physician:  Rachael Fee DD:  10/19/01 TD:  10/20/01 Job: [id number]

## 2011-03-22 NOTE — Discharge Summary (Signed)
[place]  Patient:    [name], [name]                  MRN: [id number] Adm. Date:  [id number] Disc. Date: [id number] Attending:  Avie Echevaria CC:         Charlaine Dalton. Sherene Sires, M.D. [facility]                           Discharge Summary  DATE OF BIRTH:  Sep 17, 1966  ADMISSION DIAGNOSES: 1. Intrauterine pregnancy--35 weeks. 2. Status asthmaticus. 3. Gastroesophageal reflux disease.  DISCHARGE DIAGNOSES: 1. Intrauterine pregnancy--35 weeks. 2. Status asthmaticus. 3. Gastroesophageal reflux disease.  HISTORY OF PRESENT ILLNESS/HOSPITAL COURSE:  Please refer to the admission history and physical for the details of her admission.  Briefly, she was admitted by Dr. Shelle Iron in the early morning hours of January 26, 2000 with status asthmaticus.  She has a past history of asthma since childhood.  She has known history of difficulty with reflux disease and asthma during pregnancy.  She is [redacted] weeks pregnant.  She responded well to the usual therapy of intravenous steroids and nebulized bronchodilators.  On the day following admission, she was anxious to go home and her examination had improved dramatically.  Therefore, she is discharged to home with instructions to follow up with Dr. Sherene Sires in one week.  DISCHARGE MEDICATIONS: 1. Pulmicort Turbuhaler 4 puffs b.i.d. 2. Serevent Diskus 1 puff b.i.d. 3. Ventolin metered dose inhaler 2 sprays q.3h. p.r.n. 4. Prevacid 30 mg q.12h. 5. Prednisone taper 60 mg x 3 days, 40 mg x 3 days, 20 mg x 3 days, stop. 6. Restoril 15 mg p.o. q.h.s. p.r.n. DD:  01/27/00 TD:  01/28/00 Job: [id number]

## 2011-03-22 NOTE — Assessment & Plan Note (Signed)
[name]                             PULMONARY OFFICE NOTE   NAME:CHAPMANCambree, [name]                  MRN:          [id number]  DATE:09/30/2006                            DOB:          1966-07-15    PROBLEM:  Asthma.   HISTORY:  This had been a previous patient at [facility], where he was last seen in 2005.  Prior to that, she was  seen at this office and thought to have asthma with viral exacerbation.  She had been hospitalized in 2002 with severe asthma, requiring  magnesium.  She says, since she went on Spiriva plus Asmanex, she has  done very much better with almost none of the recurrent pattern of  exacerbation that she had previously.  Usual triggers now are strong  odors, moldy smells, humidity and viral infections.  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  One son has asthma.  Aspirin makes her wheeze.  No history of nasal  polyps.   MEDICATIONS:  1. Adderall 20 mg b.i.d.  2. Asmanex one puff b.i.d.  3. Spiriva once daily.  4. Albuterol rescue inhaler.   ALLERGIES:  Drug intolerant of ASPIRIN with wheezing and of NARCOTICS  with nausea.   OBJECTIVE:  Weight 148 pounds.  BP 138/80, pulse regular, 83; room air  saturation 99%.  She seems relaxed and comfortable now.  Eyes, nose and  throat are clear.  There is no neck vein distention or stridor.  The  chest is quite clear, without rales, wheeze or rhonchi.  Heart sounds  are regular, without murmur or gallop.  No edema.  No cyanosis or  clubbing.   IMPRESSION:  Chronic asthma, now stably controlled on maintenance  medications with no need to change.  Options were reviewed.   PLAN:  1. Refill sample Spiriva one daily.  2. Refill Asmanex one puff b.i.d. with steroid mouth care emphasized.  3. Schedule return in one year, earlier p.r.n.     Clinton D. Maple Hudson, MD, Tonny Bollman, FACP  Electronically Signed    CDY/MedQ  DD: 09/30/2006  DT: 10/01/2006  Job #: [id number]   cc:   Evelena Peat, M.D.

## 2011-04-01 ENCOUNTER — Inpatient Hospital Stay (INDEPENDENT_AMBULATORY_CARE_PROVIDER_SITE_OTHER)
Admission: RE | Admit: 2011-04-01 | Discharge: 2011-04-01 | Disposition: A | Discharge status: Home / Self Care | Payer: Self-pay | Source: Ambulatory Visit | Attending: Emergency Medicine | Admitting: Emergency Medicine

## 2011-04-01 DIAGNOSIS — J45909 Unspecified asthma, uncomplicated: Secondary | ICD-10-CM

## 2011-04-01 DIAGNOSIS — J069 Acute upper respiratory infection, unspecified: Secondary | ICD-10-CM

## 2011-04-02 ENCOUNTER — Telehealth: Payer: Self-pay | Admitting: *Deleted

## 2011-04-02 NOTE — Telephone Encounter (Signed)
[facility] Triage Call Report Triage Record Num: [id number] Operator: April Finney Patient Name: [name] Call Date & Time: 03/31/2011 9:12:05AM Patient Phone: [phone] PCP: Evelena Peat Patient Gender: Female PCP Fax : [phone] Patient DOB: 1966-09-22 [facility] Name[facility] Reason for Call: Wynona Canes calling about congestion and cough. Called [place] and no answer. Left message on voice mail for her to call back when available. Protocol(s) Used: Office Note Recommended Outcome per Protocol: Information Noted and Sent to Office Reason for Outcome: Caller information to office Care Advice: ~ 03/31/2011 9:14:13AM Page 1 of 1 CAN_TriageRpt_V2

## 2011-04-02 NOTE — Telephone Encounter (Signed)
[facility] Triage Call Report Triage Record Num: [id number] Operator: Patriciaann Clan Patient Name: [name] Call Date & Time: 03/31/2011 9:56:02AM Patient Phone: [phone] PCP: Evelena Peat Patient Gender: Female PCP Fax : [phone] Patient DOB: [birth year] [facility] Name: [facility] Reason for Call: LMP 03/17/11. Patient calling. States she developed cough, chest congestion, runny nose.Onset 03/29/11. Afebrile. States occasional, mild wheezing. States has been using Mucinex. States chest tightness with deep inspiration. States only brief temporary relief after use of Nebulizer treatment. Patient requesting Rx for Prednisone. Patient speaking in full sentences without difficulty. Triage per Asthma Protocol. No emergent sx identified. Care advice given per guidelines. Patient advised to be seen in [facility] for evaluation. Patient verbalizes understanding and agreeable. Protocol(s) Used: Asthma - Adult Recommended Outcome per Protocol: See Provider within 4 hours Reason for Outcome: Having asthma symptoms that have not improved or are worsening after following provider's instructions (quick relief medicine does not relieve symptoms). Care Advice: ~ Call provider if symptoms worsen or new symptoms develop. ~ Avoid any activity that produces symptoms until evaluated by provider. ~ Rescue medication at recommended dose can be used every 20 minutes up to 3 doses. ~ SYMPTOM / CONDITION MANAGEMENT ~ List, or take, all current prescription(s), nonprescription or alternative medication(s) to provider for evaluation. Tell your provider if you: - Have been hospitalized or had ED care for your asthma in past month; - Have had 2 or more hospitalizations OR 3 or more ED visits for your asthma in past year; - Have used more than 2 canisters of rescue medication in past month; - Are currently taking or have had recent withdrawal of systemic corticosteroids; - Have  another chronic illness in addition to asthma; - Are having serious problems coping with psychosocial issues. ~ 03/31/2011 10:06:55AM Page 1 of 1 CAN_TriageRpt_V2

## 2011-04-17 ENCOUNTER — Telehealth: Payer: Self-pay | Admitting: Internal Medicine

## 2011-04-17 NOTE — Telephone Encounter (Signed)
Spoke with pt and notified 1 sample of symbicort 160/4.5 left up front for pick up. I advised should keep pending appt for future refills/samples. Pt verbalized understanding.

## 2011-04-24 ENCOUNTER — Ambulatory Visit: Payer: Self-pay | Admitting: Internal Medicine

## 2011-05-01 ENCOUNTER — Other Ambulatory Visit: Payer: Self-pay | Admitting: Family Medicine

## 2011-05-01 DIAGNOSIS — F988 Other specified behavioral and emotional disorders with onset usually occurring in childhood and adolescence: Secondary | ICD-10-CM

## 2011-05-01 MED ORDER — AMPHETAMINE-DEXTROAMPHETAMINE 20 MG PO TABS: 20.0000 mg | ORAL_TABLET | Freq: Every day | ORAL | Status: DC

## 2011-05-01 NOTE — Telephone Encounter (Signed)
Pt informed on personally identified VM Rx ready for pick-up

## 2011-05-01 NOTE — Telephone Encounter (Signed)
Ok to refill 

## 2011-05-01 NOTE — Telephone Encounter (Signed)
Pt req refill Adderall 20 mg 1 month supply

## 2011-05-01 NOTE — Telephone Encounter (Signed)
Adderall 20 mg, one daily last filled 12/27/10, #30 with 0 refills

## 2011-05-07 ENCOUNTER — Telehealth: Payer: Self-pay | Admitting: Family Medicine

## 2011-05-07 NOTE — Telephone Encounter (Signed)
Phone note dated 6/27 states pt requesting Adderall 20 mg, one month supply, so that's what we did??? Adderall XR 25 mg last filled 12/11/10

## 2011-05-07 NOTE — Telephone Encounter (Signed)
Message left on pt VM asking if she has the written Rx for the 20 mg to return, or has she already filled it???  We would like it back if it has not been filled.  Will print Rx on 7/5 after the holiday as Dr B is already gone for the holiday

## 2011-05-07 NOTE — Telephone Encounter (Signed)
OK to change to the 25mg  dose.

## 2011-05-07 NOTE — Telephone Encounter (Signed)
Pt picked up script for Adderall 20 mg, but pt realized after getting med filled that it should have been for Adderall XR 25 mg. Pt req to get a corrected script for the XR. Pls call when ready for pick up.

## 2011-05-10 MED ORDER — AMPHETAMINE-DEXTROAMPHET ER 25 MG PO CP24: 25.0000 mg | ORAL_CAPSULE | Freq: Every day | ORAL | Status: DC

## 2011-05-10 MED ORDER — AMPHETAMINE-DEXTROAMPHET ER 25 MG PO CP24: 25.0000 mg | ORAL_CAPSULE | ORAL | Status: DC

## 2011-05-10 NOTE — Telephone Encounter (Signed)
Pt called back, left a message stating she did fill the adderall 20 mg, it was only 10 dollars, so no big deal.  She would like the Adderall XR 25 mg.

## 2011-05-10 NOTE — Telephone Encounter (Signed)
Pt informed Rx ready for pick up. 

## 2011-05-20 ENCOUNTER — Ambulatory Visit (INDEPENDENT_AMBULATORY_CARE_PROVIDER_SITE_OTHER): Payer: Self-pay | Admitting: Family Medicine

## 2011-05-20 ENCOUNTER — Encounter: Payer: Self-pay | Admitting: Family Medicine

## 2011-05-20 DIAGNOSIS — I10 Essential (primary) hypertension: Secondary | ICD-10-CM

## 2011-05-20 DIAGNOSIS — J45909 Unspecified asthma, uncomplicated: Secondary | ICD-10-CM

## 2011-05-20 MED ORDER — BUDESONIDE-FORMOTEROL FUMARATE 160-4.5 MCG/ACT IN AERO: 2.0000 | INHALATION_SPRAY | Freq: Two times a day (BID) | RESPIRATORY_TRACT | Status: DC

## 2011-05-20 NOTE — Progress Notes (Signed)
  Subjective:    Patient ID: [name], female    DOB: December 23, 1965, 45 y.o.   MRN: [id number]  HPI Patient seen for the following issues  History of hypertension treated with Benicar 20 mg daily. Recently ran out of medication a few weeks ago. She feels blood pressures been up since then though not monitoring regularly. Occasional headaches. Denies any chest pain. Blood pressures have been well controlled on Benicar.  Patient has history of severe asthma. She ran out of her Symbicort few days ago and now has some increased wheezing. Denies any significant nasal congestion. No productive cough. No fever or chills. She is generally been compliant with Symbicort 2 puffs twice daily. Has followup with pulmonologist scheduled soon. Nonsmoker  Past Medical History  Diagnosis Date  . Depression   . Hypertension   . Asthma   . GERD (gastroesophageal reflux disease)   . Hepatitis A   . Jaundice   . Blood transfusion   . Migraines   . UTI (urinary tract infection)    Past Surgical History  Procedure Date  . Appendectomy 1994  . Tonsillectomy 1973    reports that she has never smoked. She does not have any smokeless tobacco history on file. Her alcohol and drug histories not on file. family history includes Asthma in her son. Allergies  Allergen Reactions  . Aspirin     REACTION: wheezing problems  . Codeine Sulfate     REACTION: rash, itiching  . Hydrocodone-Acetaminophen       Review of Systems  Constitutional: Negative for fever, chills, activity change and appetite change.  Respiratory: Positive for cough, shortness of breath and wheezing.   Cardiovascular: Negative for chest pain, palpitations and leg swelling.  Neurological: Negative for dizziness.       Objective:   Physical Exam  Constitutional: She is oriented to person, place, and time. She appears well-developed and well-nourished. No distress.  HENT:  Right Ear: External ear normal.  Left Ear: External  ear normal.  Mouth/Throat: Oropharynx is clear and moist. No oropharyngeal exudate.  Eyes: Pupils are equal, round, and reactive to light.  Neck: Neck supple.  Cardiovascular: Normal rate and regular rhythm.   Pulmonary/Chest: She has wheezes.       No retractions. No tachypnea. No rales. Some diffuse faint wheezes  Musculoskeletal: She exhibits no edema.  Lymphadenopathy:    She has no cervical adenopathy.  Neurological: She is alert and oriented to person, place, and time.          Assessment & Plan:  #1 hypertension poorly controlled off medication. Provided samples of Benicar 20 mg daily.  If inadequate samples may need to consider generic such as losartan #2 asthma which has been severe. Refilled Symbicort until patient can see pulmonologist.

## 2011-05-25 ENCOUNTER — Other Ambulatory Visit: Payer: Self-pay | Admitting: Family Medicine

## 2011-05-27 ENCOUNTER — Ambulatory Visit: Payer: Self-pay | Admitting: Internal Medicine

## 2011-07-05 ENCOUNTER — Other Ambulatory Visit: Payer: Self-pay | Admitting: Family Medicine

## 2011-07-17 ENCOUNTER — Telehealth: Payer: Self-pay | Admitting: Internal Medicine

## 2011-07-17 MED ORDER — HYDROCHLOROTHIAZIDE 25 MG PO TABS: ORAL_TABLET | ORAL | Status: DC

## 2011-07-17 NOTE — Telephone Encounter (Signed)
Last filled on 11/21/2010 under [facility]. Last OV with [facility] on 12/19/2010. Pt NOS on 05/27/2011 and cancelled appt on 04/24/2011. Pls advise on sending refills.

## 2011-07-17 NOTE — Telephone Encounter (Signed)
Ok to refill x 1 month only, then no more until next ov

## 2011-07-24 ENCOUNTER — Inpatient Hospital Stay (INDEPENDENT_AMBULATORY_CARE_PROVIDER_SITE_OTHER)
Admission: RE | Admit: 2011-07-24 | Discharge: 2011-07-24 | Disposition: A | Discharge status: Home / Self Care | Payer: Self-pay | Source: Ambulatory Visit | Attending: Emergency Medicine | Admitting: Emergency Medicine

## 2011-07-24 DIAGNOSIS — J45909 Unspecified asthma, uncomplicated: Secondary | ICD-10-CM

## 2011-07-24 DIAGNOSIS — J069 Acute upper respiratory infection, unspecified: Secondary | ICD-10-CM

## 2011-08-06 ENCOUNTER — Telehealth: Payer: Self-pay | Admitting: Family Medicine

## 2011-08-06 NOTE — Telephone Encounter (Signed)
Pt requesting refill on amphetamine-dextroamphetamine (ADDERALL XR) 25 MG 24 hr capsule  Please contact when ready to pick up

## 2011-08-08 NOTE — Telephone Encounter (Signed)
Pt is aware rx not ready yet °

## 2011-08-09 MED ORDER — AMPHETAMINE-DEXTROAMPHET ER 25 MG PO CP24: 25.0000 mg | ORAL_CAPSULE | ORAL | Status: DC

## 2011-08-09 MED ORDER — AMPHETAMINE-DEXTROAMPHET ER 25 MG PO CP24: 25.0000 mg | ORAL_CAPSULE | Freq: Every day | ORAL | Status: DC

## 2011-08-09 NOTE — Telephone Encounter (Signed)
Pt informed Rx ready for pick-up on personally identified VM

## 2011-08-14 ENCOUNTER — Ambulatory Visit: Payer: Self-pay

## 2011-08-16 ENCOUNTER — Ambulatory Visit (INDEPENDENT_AMBULATORY_CARE_PROVIDER_SITE_OTHER): Payer: 59

## 2011-08-16 DIAGNOSIS — Z23 Encounter for immunization: Secondary | ICD-10-CM

## 2011-08-20 ENCOUNTER — Telehealth: Payer: Self-pay | Admitting: Family Medicine

## 2011-08-20 NOTE — Telephone Encounter (Signed)
Per Joni Reining, pt reported she lost track of the Rx in her car when it went into the shop?

## 2011-08-20 NOTE — Telephone Encounter (Signed)
Pt lost her rx for amphetamine-dextroamphetamine (ADDERALL XR) 25 MG 24 hr capsule And is wanting to pick up a copy of it. Pt said the refill is not due until 09/10/11. Please contact

## 2011-08-21 MED ORDER — AMPHETAMINE-DEXTROAMPHET ER 25 MG PO CP24: 25.0000 mg | ORAL_CAPSULE | Freq: Every day | ORAL | Status: DC

## 2011-08-21 NOTE — Telephone Encounter (Signed)
Pt informed Rx ready for pick up. 

## 2011-08-21 NOTE — Telephone Encounter (Signed)
OK to refill.  Keep locked up if possible.

## 2011-09-11 ENCOUNTER — Emergency Department (HOSPITAL_COMMUNITY)
Admission: EM | Admit: 2011-09-11 | Discharge: 2011-09-11 | Disposition: A | Discharge status: Home / Self Care | Payer: 59 | Source: Home / Self Care

## 2011-09-11 ENCOUNTER — Encounter (HOSPITAL_COMMUNITY): Payer: Self-pay | Admitting: *Deleted

## 2011-09-11 DIAGNOSIS — J45901 Unspecified asthma with (acute) exacerbation: Secondary | ICD-10-CM

## 2011-09-11 MED ORDER — IPRATROPIUM BROMIDE 0.02 % IN SOLN
0.5000 mg | Freq: Once | RESPIRATORY_TRACT | Status: AC
  Administered 2011-09-11: 0.5 mg via RESPIRATORY_TRACT

## 2011-09-11 MED ORDER — ALBUTEROL SULFATE (5 MG/ML) 0.5% IN NEBU
5.0000 mg | INHALATION_SOLUTION | Freq: Once | RESPIRATORY_TRACT | Status: AC
  Administered 2011-09-11: 5 mg via RESPIRATORY_TRACT

## 2011-09-11 MED ORDER — ALBUTEROL SULFATE (5 MG/ML) 0.5% IN NEBU
INHALATION_SOLUTION | RESPIRATORY_TRACT | Status: AC
  Filled 2011-09-11: qty 0.5

## 2011-09-11 MED ORDER — PREDNISONE 20 MG PO TABS: ORAL_TABLET | ORAL | Status: DC

## 2011-09-11 NOTE — ED Provider Notes (Signed)
Medical screening examination/treatment/procedure(s) were performed by non-physician practitioner and as supervising physician I was immediately available for consultation/collaboration.  Corrie Mckusick, MD 09/11/11 1825

## 2011-09-11 NOTE — ED Notes (Signed)
Pt is here with complaints of SOB and wheezing with onset this am.  Pt has a history of asthma and has used nebulizer (2hours ago) and inhaler (1 hour ago) without relief.  Pt is concerned she might be having a reaction to exposure to ammonia and hair dye.  Wheezing noted in right lower lobe.

## 2011-09-11 NOTE — ED Notes (Signed)
ASked to assess this pt on arrival; states she has a problem w asthma , and had to use strong cleansers lat night. This AM, has been having more trouble than usual, but is able to speak clearly in complex sentences sentences w minimal effort

## 2011-09-11 NOTE — ED Provider Notes (Signed)
History     CSN: [id number] Arrival date & time: 09/11/2011 10:58 AM   None     Chief Complaint  Patient presents with  . Shortness of Breath    (Consider location/radiation/quality/duration/timing/severity/associated sxs/prior treatment) HPI Comments: Pt states she colored her hair this morning with a different hair color product than she usually uses. Had a much stronger amonia smell. Approx 1/2 hr later began wheezing. Used albuterol inhaler and nebulizer w/o improvement. Denies recent cough or URI symptoms.  Patient is a 45 y.o. female presenting with shortness of breath. The history is provided by the patient.  Shortness of Breath  The current episode started today. The onset was sudden. The problem has been unchanged. The problem is moderate. The symptoms are relieved by nothing. The symptoms are aggravated by nothing. Associated symptoms include shortness of breath and wheezing. Pertinent negatives include no chest pain, no chest pressure, no fever, no rhinorrhea, no sore throat and no cough. She was exposed to toxic fumes. Her past medical history is significant for asthma.    Past Medical History  Diagnosis Date  . Depression   . Hypertension   . Asthma   . GERD (gastroesophageal reflux disease)   . Hepatitis A   . Jaundice   . Blood transfusion   . Migraines   . UTI (urinary tract infection)     Past Surgical History  Procedure Date  . Appendectomy 1994  . Tonsillectomy 1973    Family History  Problem Relation Age of Onset  . Asthma Son     History  Substance Use Topics  . Smoking status: Never Smoker   . Smokeless tobacco: Not on file  . Alcohol Use: No    OB History    Grav Para Term Preterm Abortions TAB SAB Ect Mult Living                  Review of Systems  Constitutional: Negative for fever and chills.  HENT: Negative for ear pain, sore throat, rhinorrhea, sneezing and postnasal drip.   Respiratory: Positive for shortness of breath and  wheezing. Negative for cough.   Cardiovascular: Negative for chest pain.    Allergies  Aspirin; Codeine sulfate; and Hydrocodone-acetaminophen  Home Medications   Current Outpatient Rx  Name Route Sig Dispense Refill  . ALPRAZOLAM 0.5 MG PO TABS Oral Take 0.5 mg by mouth every 8 (eight) hours as needed.      . AMPHETAMINE-DEXTROAMPHETAMINE 25 MG PO CP24 Oral Take 1 capsule (25 mg total) by mouth every morning. May fill in one month 30 capsule 0  . AMPHETAMINE-DEXTROAMPHETAMINE 25 MG PO CP24 Oral Take 1 capsule (25 mg total) by mouth every morning. May fill in two months 30 capsule 0  . AMPHETAMINE-DEXTROAMPHETAMINE 25 MG PO CP24 Oral Take 1 capsule (25 mg total) by mouth daily. 30 capsule 0  . AZELASTINE HCL 137 MCG/SPRAY NA SOLN Nasal 1-2 sprays by Nasal route 2 (two) times daily as needed. for allergies     . FLUOXETINE HCL 20 MG PO CAPS  TAKE 1 CAPSULE BY MOUTH ONCE DAILY 30 capsule 6  . HYDROCHLOROTHIAZIDE 25 MG PO TABS  1/2 tablet once daily 15 tablet 0    Patient needs OV(and keep appt) for more refills  . IPRATROPIUM-ALBUTEROL 0.5-2.5 (3) MG/3ML IN SOLN Nebulization Take 3 mLs by nebulization as directed. Inhale contents of 1 vial in nebulizer as needed for asthma     . OLMESARTAN MEDOXOMIL 20 MG PO TABS Oral Take  20 mg by mouth daily.      Marland Kitchen OMEPRAZOLE 20 MG PO CPDR Oral Take 20 mg by mouth daily.      . SYMBICORT 160-4.5 MCG/ACT IN AERO  inhale 2 puffs by mouth twice a day 10.2 g 3  . TIOTROPIUM BROMIDE MONOHYDRATE 18 MCG IN CAPS Inhalation Place 18 mcg into inhaler and inhale daily.      . VENTOLIN HFA 108 (90 BASE) MCG/ACT IN AERS  use as directed 18 g 5    BP 142/100  Pulse 94  Temp(Src) 98.9 F (37.2 C) (Oral)  Resp 22  SpO2 100%  Physical Exam  Nursing note and vitals reviewed. Constitutional: She is oriented to person, place, and time. She appears well-developed and well-nourished. No distress.  HENT:  Head: Normocephalic and atraumatic.  Left Ear: External ear  normal.  Nose: Nose normal.  Mouth/Throat: Oropharynx is clear and moist. No oropharyngeal exudate.  Neck: Neck supple.  Cardiovascular: Normal rate, regular rhythm and normal heart sounds.   Pulmonary/Chest: Effort normal. No respiratory distress. She has no decreased breath sounds. She has wheezes (expiratory wheeze bilat). She has no rhonchi. She has no rales.  Lymphadenopathy:    She has no cervical adenopathy.  Neurological: She is alert and oriented to person, place, and time.  Skin: Skin is warm and dry.  Psychiatric: She has a normal mood and affect.    ED Course  Procedures (including critical care time)  Labs Reviewed - No data to display No results found.   No diagnosis found.    MDM  After NMT pt reports symptomatic improvement. Lungs CTA, no wheeze, good air movement.        Melody Comas, [place] 09/11/11 1339

## 2011-10-26 ENCOUNTER — Encounter (HOSPITAL_COMMUNITY): Payer: Self-pay | Admitting: *Deleted

## 2011-10-26 ENCOUNTER — Emergency Department (INDEPENDENT_AMBULATORY_CARE_PROVIDER_SITE_OTHER)
Admission: EM | Admit: 2011-10-26 | Discharge: 2011-10-26 | Disposition: A | Discharge status: Home / Self Care | Payer: 59 | Source: Home / Self Care

## 2011-10-26 DIAGNOSIS — R6889 Other general symptoms and signs: Secondary | ICD-10-CM

## 2011-10-26 DIAGNOSIS — I10 Essential (primary) hypertension: Secondary | ICD-10-CM

## 2011-10-26 DIAGNOSIS — J45909 Unspecified asthma, uncomplicated: Secondary | ICD-10-CM

## 2011-10-26 MED ORDER — BENZONATATE 100 MG PO CAPS: ORAL_CAPSULE | ORAL | Status: AC

## 2011-10-26 MED ORDER — OSELTAMIVIR PHOSPHATE 75 MG PO CAPS: 75.0000 mg | ORAL_CAPSULE | Freq: Two times a day (BID) | ORAL | Status: AC

## 2011-10-26 NOTE — ED Provider Notes (Signed)
History     CSN: [id number]  Arrival date & time 10/26/11  1743   None     Chief Complaint  Patient presents with  . Headache  . Nausea  . Generalized Body Aches    (Consider location/radiation/quality/duration/timing/severity/associated sxs/prior treatment) HPI Comments: Sudden onset today of fever, body aches, chills, joint pains, cough, and nasal congestion. TMax 102. No sore throat. No abd pain, N/V. She has a hx of asthma and is using her Symbicort daily and uses Albuterol prn. Has used albuterol once today with some mild wheezing and relieved symptoms. She also has a hx of HTN and ran out of medication 1-2 mos ago. She has been told by Dr Sinclair [facility] office that she needs to be seen before any further rx refills of her medications.    Past Medical History  Diagnosis Date  . Depression   . Hypertension   . Asthma   . GERD (gastroesophageal reflux disease)   . Hepatitis A   . Jaundice   . Blood transfusion   . Migraines   . UTI (urinary tract infection)     Past Surgical History  Procedure Date  . Appendectomy 1994  . Tonsillectomy 1973    Family History  Problem Relation Age of Onset  . Asthma Son   . Hypertension Other     History  Substance Use Topics  . Smoking status: Never Smoker   . Smokeless tobacco: Never Used  . Alcohol Use: No    OB History    Grav Para Term Preterm Abortions TAB SAB Ect Mult Living                  Review of Systems  Constitutional: Positive for fever, chills and fatigue.  HENT: Positive for congestion and rhinorrhea. Negative for ear pain, sore throat, sneezing, postnasal drip and sinus pressure.   Respiratory: Positive for cough and wheezing. Negative for shortness of breath.   Cardiovascular: Negative for chest pain and palpitations.  Gastrointestinal: Negative for nausea, vomiting, abdominal pain and diarrhea.  Genitourinary: Negative for decreased urine volume.    Allergies  Aspirin; Codeine sulfate; and  Hydrocodone-acetaminophen  Home Medications   Current Outpatient Rx  Name Route Sig Dispense Refill  . OMEPRAZOLE 20 MG PO CPDR Oral Take 20 mg by mouth daily.      . SYMBICORT 160-4.5 MCG/ACT IN AERO  inhale 2 puffs by mouth twice a day 10.2 g 3  . VENTOLIN HFA 108 (90 BASE) MCG/ACT IN AERS  use as directed 18 g 5  . ALPRAZOLAM 0.5 MG PO TABS Oral Take 0.5 mg by mouth every 8 (eight) hours as needed.      . AMPHETAMINE-DEXTROAMPHET ER 25 MG PO CP24 Oral Take 1 capsule (25 mg total) by mouth every morning. May fill in one month 30 capsule 0  . AMPHETAMINE-DEXTROAMPHET ER 25 MG PO CP24 Oral Take 1 capsule (25 mg total) by mouth every morning. May fill in two months 30 capsule 0  . AMPHETAMINE-DEXTROAMPHET ER 25 MG PO CP24 Oral Take 1 capsule (25 mg total) by mouth daily. 30 capsule 0  . AZELASTINE HCL 137 MCG/SPRAY NA SOLN Nasal 1-2 sprays by Nasal route 2 (two) times daily as needed. for allergies     . BENZONATATE 100 MG PO CAPS  1-2 caps every 8 hrs prn cough 30 capsule 0  . FLUOXETINE HCL 20 MG PO CAPS  TAKE 1 CAPSULE BY MOUTH ONCE DAILY 30 capsule 6  . HYDROCHLOROTHIAZIDE 25  MG PO TABS  1/2 tablet once daily 15 tablet 0    Patient needs OV(and keep appt) for more refills  . OLMESARTAN MEDOXOMIL 20 MG PO TABS Oral Take 20 mg by mouth daily.      . OSELTAMIVIR PHOSPHATE 75 MG PO CAPS Oral Take 1 capsule (75 mg total) by mouth every 12 (twelve) hours. 10 capsule 0  . PREDNISONE 20 MG PO TABS  Take 3 tabs po today, then 2 tabs po once daily x 2 days, then 1 tab po once daily x 2 days 9 tablet 0  . TIOTROPIUM BROMIDE MONOHYDRATE 18 MCG IN CAPS Inhalation Place 18 mcg into inhaler and inhale daily.        BP 147/102  Pulse 102  Temp(Src) 100.1 F (37.8 C) (Oral)  Resp 14  SpO2 99%  LMP 10/09/2011  Physical Exam  Nursing note and vitals reviewed. Constitutional: She appears well-developed and well-nourished.       Pt appears ill, but NAD  HENT:  Head: Normocephalic and atraumatic.    Right Ear: Tympanic membrane, external ear and ear canal normal.  Left Ear: Tympanic membrane, external ear and ear canal normal.  Nose: Nose normal.  Mouth/Throat: Uvula is midline, oropharynx is clear and moist and mucous membranes are normal. No oropharyngeal exudate, posterior oropharyngeal edema or posterior oropharyngeal erythema.  Neck: Neck supple.  Cardiovascular: Normal rate, regular rhythm and normal heart sounds.   Pulmonary/Chest: Effort normal and breath sounds normal. No respiratory distress.  Lymphadenopathy:    She has no cervical adenopathy.  Neurological: She is alert.  Skin: Skin is warm and dry.  Psychiatric: She has a normal mood and affect.    ED Course  Procedures (including critical care time)  Labs Reviewed - No data to display No results found.   1. Flu-like symptoms   2. Asthma   3. Hypertension       MDM  Flu like symptoms with Asthma hx.         Melody Comas, [place] 10/26/11 2107

## 2011-10-26 NOTE — ED Notes (Signed)
Pt c/o headache, nausea, and body aches since 1100 hours this day. Reports taking Mucinex & Ibuprofen w/o relief.

## 2011-10-27 ENCOUNTER — Other Ambulatory Visit: Payer: Self-pay | Admitting: Family Medicine

## 2011-10-27 NOTE — ED Provider Notes (Signed)
Medical screening examination/treatment/procedure(s) were performed by non-physician practitioner and as supervising physician I was immediately available for consultation/collaboration.   Zayana Salvador DOUGLAS MD.    Tierany Appleby Douglas Inaaya Vellucci, MD 10/27/11 1410 

## 2011-10-27 NOTE — ED Notes (Signed)
Pt present in dept w/ daughter as pt.  States she was told last night to continue albuterol HFA at home prn, but states she is out.  V.O. D. Sampson-PA: may call in Rx for albuterol.  Ventolin HFA called in to [facility] [place] - sig: 2 puffs q 4-6 hrs prn, no refills.

## 2012-01-29 ENCOUNTER — Other Ambulatory Visit: Payer: Self-pay | Admitting: Family Medicine

## 2012-02-07 ENCOUNTER — Emergency Department (HOSPITAL_COMMUNITY)
Admission: EM | Admit: 2012-02-07 | Discharge: 2012-02-09 | Disposition: A | Discharge status: Home / Self Care | Payer: BC Managed Care – PPO | Attending: Emergency Medicine | Admitting: Emergency Medicine

## 2012-02-07 ENCOUNTER — Encounter (HOSPITAL_COMMUNITY): Payer: Self-pay | Admitting: Emergency Medicine

## 2012-02-07 DIAGNOSIS — Z79899 Other long term (current) drug therapy: Secondary | ICD-10-CM | POA: Insufficient documentation

## 2012-02-07 DIAGNOSIS — I10 Essential (primary) hypertension: Secondary | ICD-10-CM | POA: Insufficient documentation

## 2012-02-07 DIAGNOSIS — K5289 Other specified noninfective gastroenteritis and colitis: Secondary | ICD-10-CM | POA: Insufficient documentation

## 2012-02-07 DIAGNOSIS — K529 Noninfective gastroenteritis and colitis, unspecified: Secondary | ICD-10-CM

## 2012-02-07 DIAGNOSIS — J45909 Unspecified asthma, uncomplicated: Secondary | ICD-10-CM | POA: Insufficient documentation

## 2012-02-07 DIAGNOSIS — R197 Diarrhea, unspecified: Secondary | ICD-10-CM | POA: Insufficient documentation

## 2012-02-07 DIAGNOSIS — R1110 Vomiting, unspecified: Secondary | ICD-10-CM | POA: Insufficient documentation

## 2012-02-07 DIAGNOSIS — R5381 Other malaise: Secondary | ICD-10-CM | POA: Insufficient documentation

## 2012-02-07 DIAGNOSIS — F3289 Other specified depressive episodes: Secondary | ICD-10-CM | POA: Insufficient documentation

## 2012-02-07 DIAGNOSIS — K219 Gastro-esophageal reflux disease without esophagitis: Secondary | ICD-10-CM | POA: Insufficient documentation

## 2012-02-07 DIAGNOSIS — R5383 Other fatigue: Secondary | ICD-10-CM | POA: Insufficient documentation

## 2012-02-07 DIAGNOSIS — F329 Major depressive disorder, single episode, unspecified: Secondary | ICD-10-CM | POA: Insufficient documentation

## 2012-02-07 HISTORY — DX: Unspecified ovarian cyst, unspecified side: N83.209

## 2012-02-07 LAB — URINALYSIS, ROUTINE W REFLEX MICROSCOPIC
Glucose, UA: 250 mg/dL — AB
Hgb urine dipstick: NEGATIVE
Ketones, ur: 15 mg/dL — AB
Leukocytes, UA: NEGATIVE
Nitrite: NEGATIVE
Protein, ur: NEGATIVE mg/dL
Specific Gravity, Urine: 1.034 — ABNORMAL HIGH (ref 1.005–1.030)
Urobilinogen, UA: 0.2 mg/dL (ref 0.0–1.0)
pH: 5.5 (ref 5.0–8.0)

## 2012-02-07 LAB — DIFFERENTIAL
Basophils Absolute: 0 10*3/uL (ref 0.0–0.1)
Basophils Relative: 0 % (ref 0–1)
Eosinophils Absolute: 0.2 10*3/uL (ref 0.0–0.7)
Eosinophils Relative: 1 % (ref 0–5)
Lymphocytes Relative: 5 % — ABNORMAL LOW (ref 12–46)
Lymphs Abs: 0.9 10*3/uL (ref 0.7–4.0)
Monocytes Absolute: 0.6 10*3/uL (ref 0.1–1.0)
Monocytes Relative: 3 % (ref 3–12)
Neutro Abs: 16.3 10*3/uL — ABNORMAL HIGH (ref 1.7–7.7)
Neutrophils Relative %: 90 % — ABNORMAL HIGH (ref 43–77)

## 2012-02-07 LAB — BASIC METABOLIC PANEL
BUN: 17 mg/dL (ref 6–23)
CO2: 23 mEq/L (ref 19–32)
Calcium: 8.9 mg/dL (ref 8.4–10.5)
Chloride: 103 mEq/L (ref 96–112)
Creatinine, Ser: 0.8 mg/dL (ref 0.50–1.10)
GFR calc Af Amer: >90 mL/min (ref >90)
GFR calc non Af Amer: 88 mL/min — ABNORMAL LOW (ref >90)
Glucose, Bld: 146 mg/dL — ABNORMAL HIGH (ref 70–99)
Potassium: 4.1 mEq/L (ref 3.5–5.1)
Sodium: 138 mEq/L (ref 135–145)

## 2012-02-07 LAB — CBC
HCT: 43.2 % (ref 36.0–46.0)
Hemoglobin: 14 g/dL (ref 12.0–15.0)
MCH: 27.1 pg (ref 26.0–34.0)
MCHC: 32.4 g/dL (ref 30.0–36.0)
MCV: 83.7 fL (ref 78.0–100.0)
Platelets: 326 10*3/uL (ref 150–400)
RBC: 5.16 MIL/uL — ABNORMAL HIGH (ref 3.87–5.11)
RDW: 14.6 % (ref 11.5–15.5)
WBC: 18.1 10*3/uL — ABNORMAL HIGH (ref 4.0–10.5)

## 2012-02-07 LAB — POCT PREGNANCY, URINE: Preg Test, Ur: NEGATIVE

## 2012-02-07 MED ORDER — SODIUM CHLORIDE 0.9 % IV BOLUS (SEPSIS)
1000.0000 mL | Freq: Once | INTRAVENOUS | Status: AC
  Administered 2012-02-07: 1000 mL via INTRAVENOUS

## 2012-02-07 MED ORDER — ALBUTEROL SULFATE HFA 108 (90 BASE) MCG/ACT IN AERS
2.0000 | INHALATION_SPRAY | RESPIRATORY_TRACT | Status: DC | PRN
  Administered 2012-02-07: 2 via RESPIRATORY_TRACT
  Filled 2012-02-07: qty 6.7

## 2012-02-07 MED ORDER — ONDANSETRON HCL 4 MG PO TABS: 4.0000 mg | ORAL_TABLET | Freq: Four times a day (QID) | ORAL | Status: AC

## 2012-02-07 MED ORDER — ONDANSETRON HCL 4 MG/2ML IJ SOLN
4.0000 mg | Freq: Once | INTRAMUSCULAR | Status: AC
  Administered 2012-02-07: 4 mg via INTRAVENOUS
  Filled 2012-02-07: qty 2

## 2012-02-07 NOTE — Discharge Instructions (Signed)
Return to the ED with any concerns including vomiting and not able to keep down liquids, abdominal pain- especially if it localizes to the right lower abdomen, blood in vomit or stools, decreased level of alertness/lethargy, or any other alarming symptoms

## 2012-02-07 NOTE — ED Notes (Signed)
PT. REPORTS VOMITTING WITH DIARRHEA ONSET 6 PM LAST NIGHT , DENIES ABDOMINAL PAIN , NO FEVER OR CHILLS.

## 2012-02-07 NOTE — ED Notes (Signed)
Pt to ED c/o acute onset emesis and diarrhea.  She decided to come to ED when pain she tried to urinate and was unable.  She states that since she has been to ED she feels symptoms are improving to he point that she is able to chew ice without vomiting.

## 2012-02-07 NOTE — ED Notes (Signed)
Pt denies nausea at this time.  Given ginger ale.

## 2012-02-07 NOTE — ED Notes (Signed)
Pt is wheezing and asking for inhaler (she forgot hers at home).

## 2012-02-07 NOTE — ED Notes (Signed)
Pt states that she is feeling much better and is willing to try drinking fluids.

## 2012-02-07 NOTE — ED Provider Notes (Signed)
History     CSN: [id number]  Arrival date & time 02/07/12  5784   First MD Initiated Contact with Patient 02/07/12 0406      Chief Complaint  Patient presents with  . Emesis    (Consider location/radiation/quality/duration/timing/severity/associated sxs/prior treatment) HPI Pt presents with acute onset of vomiting and diarrhea at approx 6pm tonight.  Pt states she had felt well until the abrupt onset of these symptoms.  Denies abdominal pain.  No fever/chills.  States she has had multiple episodes of emesis- nonbloody/nonbilious.  Also numerous episodes of watery diarrhea.  She also feels fatigued and tired.  No known specific sick contacts.  There are no other associated systemic symptoms.  There are no alleviating or modifying factors.  Has not been able to keep down liquids.    Past Medical History  Diagnosis Date  . Depression   . Hypertension   . Asthma   . GERD (gastroesophageal reflux disease)   . Hepatitis A   . Jaundice   . Blood transfusion   . Migraines   . UTI (urinary tract infection)   . Ovarian cyst     Past Surgical History  Procedure Date  . Appendectomy 1994  . Ovarian cyst removal   . Tonsillectomy and adenoidectomy     Family History  Problem Relation Age of Onset  . Asthma Son   . Hypertension Other     History  Substance Use Topics  . Smoking status: Never Smoker   . Smokeless tobacco: Never Used  . Alcohol Use: No    OB History    Grav Para Term Preterm Abortions TAB SAB Ect Mult Living                  Review of Systems ROS reviewed and all otherwise negative except for mentioned in HPI  Allergies  Aspirin; Codeine sulfate; and Hydrocodone-acetaminophen  Home Medications   Current Outpatient Rx  Name Route Sig Dispense Refill  . ALBUTEROL SULFATE HFA 108 (90 BASE) MCG/ACT IN AERS Inhalation Inhale 2 puffs into the lungs every 4 (four) hours as needed. For wheeze or shortness of breath    . FLUOXETINE HCL 20 MG PO CAPS  take 1  capsule by mouth once daily 30 capsule 6  . OMEPRAZOLE 20 MG PO CPDR Oral Take 20 mg by mouth daily.      . SYMBICORT 160-4.5 MCG/ACT IN AERO  inhale 2 puffs by mouth twice a day 10.2 g 3  . ONDANSETRON HCL 4 MG PO TABS Oral Take 1 tablet (4 mg total) by mouth every 6 (six) hours. 12 tablet 0    BP 132/76  Pulse 92  Temp(Src) 97.7 F (36.5 C) (Oral)  Resp 20  SpO2 99%  LMP 01/28/2012 Vitals reviewed Physical Exam Physical Examination: General appearance - alert, well appearing, and in no distress Mental status - alert, oriented to person, place, and time Eyes - pupils equal and reactive, no scleral icterus Mouth - mucous membranes moist, pharynx normal without lesions Chest - clear to auscultation, no wheezes, rales or rhonchi, symmetric air entry Heart - normal rate, regular rhythm, normal S1, S2, no murmurs, rubs, clicks or gallops Abdomen - soft, nontender, nondistended, no masses or organomegaly, increased bowel sounds Extremities - peripheral pulses normal, no pedal edema, no clubbing or cyanosis, brisk cap refill Skin - normal coloration and turgor, no rashes, no suspicious skin lesions noted  ED Course  Procedures (including critical care time)  Labs Reviewed  URINALYSIS,  ROUTINE W REFLEX MICROSCOPIC - Abnormal; Notable for the following:    Specific Gravity, Urine 1.034 (*)    Glucose, UA 250 (*)    Bilirubin Urine SMALL (*)    Ketones, ur 15 (*)    All other components within normal limits  CBC - Abnormal; Notable for the following:    WBC 18.1 (*)    RBC 5.16 (*)    All other components within normal limits  DIFFERENTIAL - Abnormal; Notable for the following:    Neutrophils Relative 90 (*)    Neutro Abs 16.3 (*)    Lymphocytes Relative 5 (*)    All other components within normal limits  BASIC METABOLIC PANEL - Abnormal; Notable for the following:    Glucose, Bld 146 (*)    GFR calc non Af Amer 88 (*)    All other components within normal limits  POCT  PREGNANCY, URINE   No results found.   1. Gastroenteritis       MDM  Pt presnts with c/o acute onset of nv/d/.  Symptoms most c/w acute viral gastroenteritis.  Pt feels improved after IV hydration and antiemetics.  She has a benign adominal exam.  She was discharged with stirct reutrn precautions and is agreeable with this plan.         Ethelda Chick, MD 02/09/12 [phone]

## 2012-02-14 ENCOUNTER — Other Ambulatory Visit: Payer: Self-pay | Admitting: *Deleted

## 2012-02-14 MED ORDER — AMPHETAMINE-DEXTROAMPHET ER 20 MG PO CP24: 20.0000 mg | ORAL_CAPSULE | ORAL | Status: DC

## 2012-02-14 MED ORDER — AMPHETAMINE-DEXTROAMPHET ER 20 MG PO CP24: 20.0000 mg | ORAL_CAPSULE | Freq: Every day | ORAL | Status: DC

## 2012-02-25 ENCOUNTER — Telehealth: Payer: Self-pay | Admitting: Family Medicine

## 2012-02-25 NOTE — Telephone Encounter (Signed)
Pt requesting refill on: amphetamine-dextroamphetamine (ADDERALL) 10 MG tablet .

## 2012-02-26 MED ORDER — AMPHETAMINE-DEXTROAMPHETAMINE 10 MG PO TABS: ORAL_TABLET | ORAL | Status: DC

## 2012-02-26 NOTE — Telephone Encounter (Signed)
Pt informed on VM Rx ready to pick-up 

## 2012-02-26 NOTE — Telephone Encounter (Signed)
OK to refill

## 2012-02-26 NOTE — Telephone Encounter (Signed)
Adderall 10 mg was last filled 12-27-10, take in the PM

## 2012-03-25 ENCOUNTER — Telehealth: Payer: Self-pay | Admitting: Family Medicine

## 2012-03-25 NOTE — Telephone Encounter (Signed)
Pt req new script for amphetamine-dextroamphetamine (ADDERALL, 10MG ,) 10 MG tablet , which is due to be filled this Sunday 03/29/12. Pt req to pick up script on Friday.

## 2012-03-27 MED ORDER — AMPHETAMINE-DEXTROAMPHETAMINE 10 MG PO TABS: ORAL_TABLET | ORAL | Status: DC

## 2012-03-27 NOTE — Telephone Encounter (Signed)
Pt informed Rx ready for pick-up on VM 

## 2012-04-06 ENCOUNTER — Emergency Department (HOSPITAL_COMMUNITY): Payer: BC Managed Care – PPO

## 2012-04-06 ENCOUNTER — Encounter (HOSPITAL_COMMUNITY): Payer: Self-pay | Admitting: Emergency Medicine

## 2012-04-06 ENCOUNTER — Emergency Department (HOSPITAL_COMMUNITY)
Admission: EM | Admit: 2012-04-06 | Discharge: 2012-04-06 | Disposition: A | Discharge status: Home / Self Care | Payer: BC Managed Care – PPO | Attending: Emergency Medicine | Admitting: Emergency Medicine

## 2012-04-06 DIAGNOSIS — J069 Acute upper respiratory infection, unspecified: Secondary | ICD-10-CM

## 2012-04-06 DIAGNOSIS — F3289 Other specified depressive episodes: Secondary | ICD-10-CM | POA: Insufficient documentation

## 2012-04-06 DIAGNOSIS — F329 Major depressive disorder, single episode, unspecified: Secondary | ICD-10-CM | POA: Insufficient documentation

## 2012-04-06 DIAGNOSIS — G43909 Migraine, unspecified, not intractable, without status migrainosus: Secondary | ICD-10-CM | POA: Insufficient documentation

## 2012-04-06 DIAGNOSIS — I10 Essential (primary) hypertension: Secondary | ICD-10-CM | POA: Insufficient documentation

## 2012-04-06 DIAGNOSIS — Z79899 Other long term (current) drug therapy: Secondary | ICD-10-CM | POA: Insufficient documentation

## 2012-04-06 DIAGNOSIS — J45909 Unspecified asthma, uncomplicated: Secondary | ICD-10-CM | POA: Insufficient documentation

## 2012-04-06 DIAGNOSIS — K219 Gastro-esophageal reflux disease without esophagitis: Secondary | ICD-10-CM | POA: Insufficient documentation

## 2012-04-06 MED ORDER — PREDNISONE 20 MG PO TABS: 60.0000 mg | ORAL_TABLET | Freq: Every day | ORAL | Status: DC

## 2012-04-06 MED ORDER — IPRATROPIUM BROMIDE 0.02 % IN SOLN
0.5000 mg | Freq: Once | RESPIRATORY_TRACT | Status: AC
  Administered 2012-04-06: 0.5 mg via RESPIRATORY_TRACT
  Filled 2012-04-06: qty 2.5

## 2012-04-06 MED ORDER — ALBUTEROL SULFATE (5 MG/ML) 0.5% IN NEBU
5.0000 mg | INHALATION_SOLUTION | Freq: Once | RESPIRATORY_TRACT | Status: AC
  Administered 2012-04-06: 5 mg via RESPIRATORY_TRACT
  Filled 2012-04-06: qty 1

## 2012-04-06 MED ORDER — PREDNISONE 20 MG PO TABS
60.0000 mg | ORAL_TABLET | Freq: Once | ORAL | Status: AC
  Administered 2012-04-06: 60 mg via ORAL
  Filled 2012-04-06: qty 3

## 2012-04-06 NOTE — Discharge Instructions (Signed)
FOLLOW UP WITH YOUR DOCTOR FOR RECHECK IN 2-3 DAYS FOR RECHECK OF ASTHMA AND URI SYMPTOMS. TAKE PREDNISONE AS DIRECTED. CONTINUE YOUR ALBUTEROL MEDICATIONS AS REGULARLY USED. RETURN HERE WITH ANY WORSENING SYMPTOMS OR NEW CONCERNS.  Upper Respiratory Infection, Adult An upper respiratory infection (URI) is also known as the common cold. It is often caused by a type of germ (virus). Colds are easily spread (contagious). You can pass it to others by kissing, coughing, sneezing, or drinking out of the same glass. Usually, you get better in 1 or 2 weeks.  HOME CARE   Only take medicine as told by your doctor.   Use a warm mist humidifier or breathe in steam from a hot shower.   Drink enough water and fluids to keep your pee (urine) clear or pale yellow.   Get plenty of rest.   Return to work when your temperature is back to normal or as told by your doctor. You may use a face mask and wash your hands to stop your cold from spreading.  GET HELP RIGHT AWAY IF:   After the first few days, you feel you are getting worse.   You have questions about your medicine.   You have chills, shortness of breath, or brown or red spit (mucus).   You have yellow or brown snot (nasal discharge) or pain in the face, especially when you bend forward.   You have a fever, puffy (swollen) neck, pain when you swallow, or white spots in the back of your throat.   You have a bad headache, ear pain, sinus pain, or chest pain.   You have a high-pitched whistling sound when you breathe in and out (wheezing).   You have a lasting cough or cough up blood.   You have sore muscles or a stiff neck.  MAKE SURE YOU:   Understand these instructions.   Will watch your condition.   Will get help right away if you are not doing well or get worse.  Document Released: 04/08/2008 Document Revised: 10/10/2011 Document Reviewed: 02/25/2011 [facility] Patient Information 2012 [place], [place].  Asthma Attack Prevention HOW  CAN ASTHMA BE PREVENTED? Currently, there is no way to prevent asthma from starting. However, you can take steps to control the disease and prevent its symptoms after you have been diagnosed. Learn about your asthma and how to control it. Take an active role to control your asthma by working with your caregiver to create and follow an asthma action plan. An asthma action plan guides you in taking your medicines properly, avoiding factors that make your asthma worse, tracking your level of asthma control, responding to worsening asthma, and seeking emergency care when needed. To track your asthma, keep records of your symptoms, check your peak flow number using a peak flow meter (handheld device that shows how well air moves out of your lungs), and get regular asthma checkups.  Other ways to prevent asthma attacks include:  Use medicines as your caregiver directs.   Identify and avoid things that make your asthma worse (as much as you can).   Keep track of your asthma symptoms and level of control.   Get regular checkups for your asthma.   With your caregiver, write a detailed plan for taking medicines and managing an asthma attack. Then be sure to follow your action plan. Asthma is an ongoing condition that needs regular monitoring and treatment.   Identify and avoid asthma triggers. A number of outdoor allergens and irritants (pollen, mold,  cold air, air pollution) can trigger asthma attacks. Find out what causes or makes your asthma worse, and take steps to avoid those triggers (see below).   Monitor your breathing. Learn to recognize warning signs of an attack, such as slight coughing, wheezing or shortness of breath. However, your lung function may already decrease before you notice any signs or symptoms, so regularly measure and record your peak airflow with a home peak flow meter.   Identify and treat attacks early. If you act quickly, you're less likely to have a severe attack. You will also  need less medicine to control your symptoms. When your peak flow measurements decrease and alert you to an upcoming attack, take your medicine as instructed, and immediately stop any activity that may have triggered the attack. If your symptoms do not improve, get medical help.   Pay attention to increasing quick-relief inhaler use. If you find yourself relying on your quick-relief inhaler (such as albuterol), your asthma is not under control. See your caregiver about adjusting your treatment.  IDENTIFY AND CONTROL FACTORS THAT MAKE YOUR ASTHMA WORSE A number of common things can set off or make your asthma symptoms worse (asthma triggers). Keep track of your asthma symptoms for several weeks, detailing all the environmental and emotional factors that are linked with your asthma. When you have an asthma attack, go back to your asthma diary to see which factor, or combination of factors, might have contributed to it. Once you know what these factors are, you can take steps to control many of them.  Allergies: If you have allergies and asthma, it is important to take asthma prevention steps at home. Asthma attacks (worsening of asthma symptoms) can be triggered by allergies, which can cause temporary increased inflammation of your airways. Minimizing contact with the substance to which you are allergic will help prevent an asthma attack. Animal Dander:   Some people are allergic to the flakes of skin or dried saliva from animals with fur or feathers. Keep these pets out of your home.   If you can't keep a pet outdoors, keep the pet out of your bedroom and other sleeping areas at all times, and keep the door closed.   Remove carpets and furniture covered with cloth from your home. If that is not possible, keep the pet away from fabric-covered furniture and carpets.  Dust Mites:  Many people with asthma are allergic to dust mites. Dust mites are tiny bugs that are found in every home, in mattresses,  pillows, carpets, fabric-covered furniture, bedcovers, clothes, stuffed toys, fabric, and other fabric-covered items.   Cover your mattress in a special dust-proof cover.   Cover your pillow in a special dust-proof cover, or wash the pillow each week in hot water. Water must be hotter than 130 F to kill dust mites. Cold or warm water used with detergent and bleach can also be effective.   Wash the sheets and blankets on your bed each week in hot water.   Try not to sleep or lie on cloth-covered cushions.   Call ahead when traveling and ask for a smoke-free hotel room. Bring your own bedding and pillows, in case the hotel only supplies feather pillows and down comforters, which may contain dust mites and cause asthma symptoms.   Remove carpets from your bedroom and those laid on concrete, if you can.   Keep stuffed toys out of the bed, or wash the toys weekly in hot water or cooler water with detergent and  bleach.  Cockroaches:  Many people with asthma are allergic to the droppings and remains of cockroaches.   Keep food and garbage in closed containers. Never leave food out.   Use poison baits, traps, powders, gels, or paste (for example, boric acid).   If a spray is used to kill cockroaches, stay out of the room until the odor goes away.  Indoor Mold:  Fix leaky faucets, pipes, or other sources of water that have mold around them.   Clean moldy surfaces with a cleaner that has bleach in it.  Pollen and Outdoor Mold:  When pollen or mold spore counts are high, try to keep your windows closed.   Stay indoors with windows closed from late morning to afternoon, if you can. Pollen and some mold spore counts are highest at that time.   Ask your caregiver whether you need to take or increase anti-inflammatory medicine before your allergy season starts.  Irritants:   Tobacco smoke is an irritant. If you smoke, ask your caregiver how you can quit. Ask family members to quit smoking,  too. Do not allow smoking in your home or car.   If possible, do not use a wood-burning stove, kerosene heater, or fireplace. Minimize exposure to all sources of smoke, including incense, candles, fires, and fireworks.   Try to stay away from strong odors and sprays, such as perfume, talcum powder, hair spray, and paints.   Decrease humidity in your home and use an indoor air cleaning device. Reduce indoor humidity to below 60 percent. Dehumidifiers or central air conditioners can do this.   Try to have someone else vacuum for you once or twice a week, if you can. Stay out of rooms while they are being vacuumed and for a short while afterward.   If you vacuum, use a dust mask from a hardware store, a double-layered or microfilter vacuum cleaner bag, or a vacuum cleaner with a HEPA filter.   Sulfites in foods and beverages can be irritants. Do not drink beer or wine, or eat dried fruit, processed potatoes, or shrimp if they cause asthma symptoms.   Cold air can trigger an asthma attack. Cover your nose and mouth with a scarf on cold or windy days.   Several health conditions can make asthma more difficult to manage, including runny nose, sinus infections, reflux disease, psychological stress, and sleep apnea. Your caregiver will treat these conditions, as well.   Avoid close contact with people who have a cold or the flu, since your asthma symptoms may get worse if you catch the infection from them. Wash your hands thoroughly after touching items that may have been handled by people with a respiratory infection.   Get a flu shot every year to protect against the flu virus, which often makes asthma worse for days or weeks. Also get a pneumonia shot once every five to 10 years.  Drugs:  Aspirin and other painkillers can cause asthma attacks. 10% to 20% of people with asthma have sensitivity to aspirin or a group of painkillers called non-steroidal anti-inflammatory drugs (NSAIDS), such as  ibuprofen and naproxen. These drugs are used to treat pain and reduce fevers. Asthma attacks caused by any of these medicines can be severe and even fatal. These drugs must be avoided in people who have known aspirin sensitive asthma. Products with acetaminophen are considered safe for people who have asthma. It is important that people with aspirin sensitivity read labels of all over-the-counter drugs used to treat pain,  colds, coughs, and fever.   Beta blockers and ACE inhibitors are other drugs which you should discuss with your caregiver, in relation to your asthma.  ALLERGY SKIN TESTING  Ask your asthma caregiver about allergy skin testing or blood testing (RAST test) to identify the allergens to which you are sensitive. If you are found to have allergies, allergy shots (immunotherapy) for asthma may help prevent future allergies and asthma. With allergy shots, small doses of allergens (substances to which you are allergic) are injected under your skin on a regular schedule. Over a period of time, your body may become used to the allergen and less responsive with asthma symptoms. You can also take measures to minimize your exposure to those allergens. EXERCISE  If you have exercise-induced asthma, or are planning vigorous exercise, or exercise in cold, humid, or dry environments, prevent exercise-induced asthma by following your caregiver's advice regarding asthma treatment before exercising. Document Released: 10/09/2009 Document Revised: 10/10/2011 Document Reviewed: 10/09/2009 [facility] Patient Information 2012 [place], [place].

## 2012-04-06 NOTE — ED Notes (Addendum)
PT. REPORTS ASTHMA WITH OCCASIONAL PRODUCTIVE COUGH ONSET LAST Friday WORSE THIS MORNING , UNRELIEVED BY ALBUTEROL /SYMBICORT INHALER.

## 2012-04-06 NOTE — ED Notes (Signed)
NEBULIZER TREATMENT IN PROGRESS, BREATHING EASIER WITH OCCASIONAL DRY COUGH.

## 2012-04-06 NOTE — ED Notes (Signed)
NO IV AT ARRIVAL.

## 2012-04-06 NOTE — ED Provider Notes (Signed)
History     CSN: [id number]  Arrival date & time 04/06/12  0630   First MD Initiated Contact with Patient 04/06/12 [phone]      Chief Complaint  Patient presents with  . Asthma    (Consider location/radiation/quality/duration/timing/severity/associated sxs/prior treatment) Patient is a 46 y.o. female presenting with asthma. The history is provided by the patient.  Asthma This is a new problem. The current episode started today. The problem occurs constantly. The problem has been gradually worsening. Associated symptoms include congestion and coughing. Pertinent negatives include no abdominal pain, chest pain, fever, nausea, sore throat or vomiting. Associated symptoms comments: She woke at 3:00 this morning with increased wheezing c/w history of asthma. Nebulizer machine not working and Symbicort and Ventolin not relieving symptoms. She has had a productive cough for several days without fever, similar to family members. No N, V. No chest pain..    Past Medical History  Diagnosis Date  . Depression   . Hypertension   . Asthma   . GERD (gastroesophageal reflux disease)   . Hepatitis A   . Jaundice   . Blood transfusion   . Migraines   . UTI (urinary tract infection)   . Ovarian cyst     Past Surgical History  Procedure Date  . Appendectomy 1994  . Ovarian cyst removal   . Tonsillectomy and adenoidectomy     Family History  Problem Relation Age of Onset  . Asthma Son   . Hypertension Other     History  Substance Use Topics  . Smoking status: Never Smoker   . Smokeless tobacco: Never Used  . Alcohol Use: No    OB History    Grav Para Term Preterm Abortions TAB SAB Ect Mult Living                  Review of Systems  Constitutional: Negative for fever.  HENT: Positive for congestion. Negative for sore throat.   Respiratory: Positive for cough, chest tightness and wheezing.   Cardiovascular: Negative for chest pain.  Gastrointestinal: Negative for nausea,  vomiting and abdominal pain.    Allergies  Aspirin; Codeine sulfate; and Hydrocodone-acetaminophen  Home Medications   Current Outpatient Rx  Name Route Sig Dispense Refill  . ALBUTEROL SULFATE HFA 108 (90 BASE) MCG/ACT IN AERS Inhalation Inhale 2 puffs into the lungs every 4 (four) hours as needed. For wheeze or shortness of breath    . AMPHETAMINE-DEXTROAMPHET ER 20 MG PO CP24 Oral Take 1 capsule (20 mg total) by mouth daily. 30 capsule 0  . AMPHETAMINE-DEXTROAMPHETAMINE 10 MG PO TABS Oral Take 10 mg by mouth daily.    Marland Kitchen OMEPRAZOLE 20 MG PO CPDR Oral Take 20 mg by mouth daily.      . SYMBICORT 160-4.5 MCG/ACT IN AERO  inhale 2 puffs by mouth twice a day 10.2 g 3    BP 170/99  Pulse 101  Temp(Src) 97.6 F (36.4 C) (Oral)  Resp 18  SpO2 98%  LMP 04/01/2012  Physical Exam  Constitutional: She is oriented to person, place, and time. She appears well-developed and well-nourished. No distress.  HENT:  Head: Normocephalic.  Neck: Normal range of motion.  Cardiovascular: Normal rate.   No murmur heard. Pulmonary/Chest: Effort normal.       Rhonchi with expiratory wheezing. Air movement in all fields without being diminished.  Abdominal: There is no tenderness.  Musculoskeletal: Normal range of motion.  Neurological: She is alert and oriented to person, place, and time.  Skin: Skin is warm and dry.    ED Course  Procedures (including critical care time)  Labs Reviewed - No data to display No results found.  Dg Chest 2 View  04/06/2012  *RADIOLOGY REPORT*  Clinical Data: Asthma.  Respiratory infection, cough, congestion. Difficulty breathing.  CHEST - 2 VIEW  Comparison: 04/06/2010  Findings: Heart and mediastinal contours are within normal limits. No focal opacities or effusions.  No acute bony abnormality.  IMPRESSION: No active cardiopulmonary disease.  Original Report Authenticated By: Cyndie Chime, M.D.   No diagnosis found.  1. Asthma 2. Glenford Peers   MDM  The patient  feels better after a delay between 1st and 2nd treatment. Delay due to having to wait for respiratory therapy to administer treatment. Will discharge home.         Rodena Medin, PA-C 04/06/12 [phone]

## 2012-04-06 NOTE — ED Notes (Signed)
Notified Respiratory for neb tx. Will be here in a few minutes.

## 2012-04-07 NOTE — ED Provider Notes (Signed)
Medical screening examination/treatment/procedure(s) were performed by non-physician practitioner and as supervising physician I was immediately available for consultation/collaboration.  Sunnie Nielsen, MD 04/07/12 [phone]

## 2012-04-22 ENCOUNTER — Telehealth: Payer: Self-pay | Admitting: Family Medicine

## 2012-04-22 NOTE — Telephone Encounter (Signed)
Pt called back and that [name] is correct. It is suppose to be 10 mg for Adderall.

## 2012-04-22 NOTE — Telephone Encounter (Signed)
Patient called stating that she need a refill of her adderall. Please assist.  °

## 2012-04-22 NOTE — Telephone Encounter (Signed)
VM left for pt asking that she clarify what dose she is requesting.  20 mg last filled X 3 on 02-14-12, 10 mg last filled on 03-27-12, #30 with 0 refills

## 2012-04-24 MED ORDER — AMPHETAMINE-DEXTROAMPHETAMINE 10 MG PO TABS: 10.0000 mg | ORAL_TABLET | Freq: Every day | ORAL | Status: DC

## 2012-04-24 NOTE — Telephone Encounter (Signed)
Pt informed on VM Rx is ready for pick-up.

## 2012-04-24 NOTE — Addendum Note (Signed)
Addended by: Melchor Amour on: 04/24/2012 09:35 AM   Modules accepted: Orders

## 2012-05-11 ENCOUNTER — Telehealth: Payer: Self-pay | Admitting: Family Medicine

## 2012-05-11 NOTE — Telephone Encounter (Signed)
Patient called stating that she need a refill of her adderall 20mg  xr. Please assist.

## 2012-05-12 NOTE — Telephone Encounter (Signed)
Adderall 20, one daily last filled 02/14/12 for 3 months

## 2012-05-13 MED ORDER — AMPHETAMINE-DEXTROAMPHET ER 20 MG PO CP24: 20.0000 mg | ORAL_CAPSULE | Freq: Every day | ORAL | Status: DC

## 2012-05-13 NOTE — Telephone Encounter (Signed)
Pt informed on VM Rx is ready to pick-up

## 2012-05-13 NOTE — Telephone Encounter (Signed)
done

## 2012-05-21 ENCOUNTER — Other Ambulatory Visit: Payer: Self-pay | Admitting: Family Medicine

## 2012-05-21 NOTE — Telephone Encounter (Signed)
Yes

## 2012-05-21 NOTE — Telephone Encounter (Signed)
Pt needs new rx generic adderall 10mg  no later than Monday 05-25-12

## 2012-05-21 NOTE — Telephone Encounter (Signed)
Adderall 10 mg last filled 04/24/12, #30 with 0 refills.  Looks like she is using this PM dose daily, can we provide 3 Rx this time?

## 2012-05-22 MED ORDER — AMPHETAMINE-DEXTROAMPHETAMINE 10 MG PO TABS: 10.0000 mg | ORAL_TABLET | Freq: Every day | ORAL | Status: DC | PRN

## 2012-05-22 NOTE — Telephone Encounter (Signed)
Pt informed ready to pick up

## 2012-06-11 ENCOUNTER — Telehealth: Payer: Self-pay | Admitting: Family Medicine

## 2012-06-11 NOTE — Telephone Encounter (Signed)
Refill for 3 months. 

## 2012-06-11 NOTE — Telephone Encounter (Signed)
Adderall XR 20 mg last filled 03/15/12 #30 with 2 refills

## 2012-06-11 NOTE — Telephone Encounter (Signed)
Patient called stating that she need a refill of her adderall xr 20 mg. Please assist.

## 2012-06-12 MED ORDER — AMPHETAMINE-DEXTROAMPHET ER 20 MG PO CP24: 20.0000 mg | ORAL_CAPSULE | Freq: Every day | ORAL | Status: DC

## 2012-06-12 MED ORDER — AMPHETAMINE-DEXTROAMPHETAMINE 10 MG PO TABS: 10.0000 mg | ORAL_TABLET | Freq: Every day | ORAL | Status: DC | PRN

## 2012-06-12 NOTE — Telephone Encounter (Signed)
rx up front ready for p/u, pt aware 

## 2012-06-12 NOTE — Telephone Encounter (Signed)
Pt called to check on status of Adderall XR 20 mg. Pt req to pick up today before 4pm. Pls call when ready for pick up.

## 2012-06-23 ENCOUNTER — Emergency Department (HOSPITAL_COMMUNITY)
Admission: EM | Admit: 2012-06-23 | Discharge: 2012-06-23 | Disposition: A | Discharge status: Home / Self Care | Payer: BC Managed Care – PPO | Attending: Emergency Medicine | Admitting: Emergency Medicine

## 2012-06-23 ENCOUNTER — Encounter (HOSPITAL_COMMUNITY): Payer: Self-pay | Admitting: *Deleted

## 2012-06-23 DIAGNOSIS — I10 Essential (primary) hypertension: Secondary | ICD-10-CM | POA: Insufficient documentation

## 2012-06-23 DIAGNOSIS — K219 Gastro-esophageal reflux disease without esophagitis: Secondary | ICD-10-CM | POA: Insufficient documentation

## 2012-06-23 DIAGNOSIS — J45901 Unspecified asthma with (acute) exacerbation: Secondary | ICD-10-CM

## 2012-06-23 DIAGNOSIS — J45909 Unspecified asthma, uncomplicated: Secondary | ICD-10-CM | POA: Insufficient documentation

## 2012-06-23 MED ORDER — ALBUTEROL SULFATE (5 MG/ML) 0.5% IN NEBU
2.5000 mg | INHALATION_SOLUTION | Freq: Once | RESPIRATORY_TRACT | Status: AC
  Administered 2012-06-23: 2.5 mg via RESPIRATORY_TRACT
  Filled 2012-06-23: qty 1

## 2012-06-23 MED ORDER — PREDNISONE 50 MG PO TABS: 50.0000 mg | ORAL_TABLET | Freq: Every day | ORAL | Status: DC

## 2012-06-23 MED ORDER — IPRATROPIUM BROMIDE 0.02 % IN SOLN
0.5000 mg | Freq: Once | RESPIRATORY_TRACT | Status: AC
  Administered 2012-06-23: 0.5 mg via RESPIRATORY_TRACT
  Filled 2012-06-23: qty 2.5

## 2012-06-23 MED ORDER — PREDNISONE 20 MG PO TABS
60.0000 mg | ORAL_TABLET | Freq: Once | ORAL | Status: AC
  Administered 2012-06-23: 60 mg via ORAL
  Filled 2012-06-23: qty 3

## 2012-06-23 MED ORDER — ALBUTEROL SULFATE (5 MG/ML) 0.5% IN NEBU
5.0000 mg | INHALATION_SOLUTION | Freq: Once | RESPIRATORY_TRACT | Status: AC
  Administered 2012-06-23: 5 mg via RESPIRATORY_TRACT
  Filled 2012-06-23: qty 1

## 2012-06-23 NOTE — ED Provider Notes (Signed)
History     CSN: [id number]  Arrival date & time 06/23/12  2006   First MD Initiated Contact with Patient 06/23/12 [phone]      Chief Complaint  Patient presents with  . Asthma    (Consider location/radiation/quality/duration/timing/severity/associated sxs/prior treatment) HPI Comments: Asthma: Patient complains of possible asthma. The patient has been previously diagnosed with asthma. Symptoms have previously included dyspnea, non-productive cough and wheezing. Associated symptoms include wheezing.  Suspected precipitants include no identifiable factor.  Symptoms have been unchanged since their onset. Observed precipitants include no identifiable factor.  Current limitations in activity from asthma include none.  Number of days of school or work missed in the last month: 0. The previous exacerbation occurred 3 months ago. The patient reports adherence to this regimen.   Patient is a 46 y.o. female presenting with asthma. The history is provided by the patient.  Asthma Associated symptoms include shortness of breath. Pertinent negatives include no chest pain, no abdominal pain and no headaches.    Past Medical History  Diagnosis Date  . Depression   . Hypertension   . Asthma   . GERD (gastroesophageal reflux disease)   . Hepatitis A   . Jaundice   . Blood transfusion   . Migraines   . UTI (urinary tract infection)   . Ovarian cyst     Past Surgical History  Procedure Date  . Appendectomy 1994  . Ovarian cyst removal   . Tonsillectomy and adenoidectomy     Family History  Problem Relation Age of Onset  . Asthma Son   . Hypertension Other     History  Substance Use Topics  . Smoking status: Never Smoker   . Smokeless tobacco: Never Used  . Alcohol Use: No    OB History    Grav Para Term Preterm Abortions TAB SAB Ect Mult Living                  Review of Systems  Constitutional: Positive for activity change.  HENT: Negative for neck pain.   Respiratory:  Positive for cough, shortness of breath and wheezing.   Cardiovascular: Negative for chest pain.  Gastrointestinal: Negative for nausea, vomiting and abdominal pain.  Genitourinary: Negative for dysuria.  Neurological: Negative for headaches.    Allergies  Aspirin; Codeine sulfate; and Hydrocodone-acetaminophen  Home Medications   Current Outpatient Rx  Name Route Sig Dispense Refill  . ALBUTEROL SULFATE HFA 108 (90 BASE) MCG/ACT IN AERS Inhalation Inhale 2 puffs into the lungs every 4 (four) hours as needed. For wheeze or shortness of breath    . AMPHETAMINE-DEXTROAMPHET ER 20 MG PO CP24 Oral Take 1 capsule (20 mg total) by mouth daily. 30 capsule 0  . AMPHETAMINE-DEXTROAMPHETAMINE 10 MG PO TABS Oral Take 1 tablet (10 mg total) by mouth daily as needed. May fill in two months 30 tablet 0  . OMEPRAZOLE 20 MG PO CPDR Oral Take 20 mg by mouth daily.      . SYMBICORT 160-4.5 MCG/ACT IN AERO  inhale 2 puffs by mouth twice a day 10.2 g 3    BP 146/97  Pulse 80  Temp 98.7 F (37.1 C)  Resp 18  SpO2 97%  Physical Exam  Nursing note and vitals reviewed. Constitutional: She is oriented to person, place, and time. She appears well-developed and well-nourished.  HENT:  Head: Normocephalic and atraumatic.  Eyes: EOM are normal. Pupils are equal, round, and reactive to light.  Neck: Neck supple.  Cardiovascular: Normal  rate, regular rhythm and normal heart sounds.   No murmur heard. Pulmonary/Chest: Effort normal. No respiratory distress. She has wheezes.       Fine wheezing in the lower lung fields, expiratory.  Abdominal: Soft. She exhibits no distension. There is no tenderness. There is no rebound and no guarding.  Neurological: She is alert and oriented to person, place, and time.  Skin: Skin is warm and dry.    ED Course  Procedures (including critical care time)  Labs Reviewed - No data to display No results found.   No diagnosis found.    MDM   DDX: Asthma CHF PE COPD Pneumonia Viral syndrome  Pt with hx of asthma comes in with cc of wheezing, sob and cough. Sx are typical of previous asthma. Pt has no hx of PE, DVT and no risk factors for the same and there is no cardiac hx. Pt feels a lot better post 2 treatments since EMS was called. Pt has no fevers. Wanted to get CXR, but patient doesn't think she has pneumonia and refusing it - which i understand. Will auscultate post 1 more treatment and make sure she is doing better. Will give prednisone.        Derwood Kaplan, MD 06/23/12 [phone]

## 2012-06-23 NOTE — ED Notes (Signed)
Pt c/o SOB since yesterday, has hx of asthma and resume inhaler is not relieving sx.  Denies n/v, CP

## 2012-06-29 ENCOUNTER — Other Ambulatory Visit: Payer: Self-pay | Admitting: Family Medicine

## 2012-06-29 MED ORDER — BUDESONIDE-FORMOTEROL FUMARATE 160-4.5 MCG/ACT IN AERO: 2.0000 | INHALATION_SPRAY | Freq: Two times a day (BID) | RESPIRATORY_TRACT | Status: DC

## 2012-06-29 MED ORDER — AMPHETAMINE-DEXTROAMPHET ER 20 MG PO CP24: 20.0000 mg | ORAL_CAPSULE | Freq: Every day | ORAL | Status: DC

## 2012-06-29 NOTE — Telephone Encounter (Signed)
Pt informed Rx ready to pick up

## 2012-06-29 NOTE — Telephone Encounter (Signed)
Pt lost albuterol inhaler,adderall and symbicort. [facility] [address]. Pt was watching her child on Saturday play tennis and her ?pocket book was accidently left behind. Pt would like a sample of symbicort and refills/new rx adderall.

## 2012-06-29 NOTE — Telephone Encounter (Signed)
I spoke with pt, no symbicort samples available, albuterol lost was son John's.  Pt has Adderall 20 XR #2 and #3 prescriptions at home, requesting phone call to pt pharmacy to early refill #2.

## 2012-06-29 NOTE — Telephone Encounter (Signed)
Made refill Adderall

## 2012-07-09 ENCOUNTER — Encounter: Payer: Self-pay | Admitting: Family Medicine

## 2012-07-09 ENCOUNTER — Ambulatory Visit (INDEPENDENT_AMBULATORY_CARE_PROVIDER_SITE_OTHER): Payer: BC Managed Care – PPO | Admitting: Family Medicine

## 2012-07-09 VITALS — BP 150/110 | Temp 99.6°F

## 2012-07-09 DIAGNOSIS — F411 Generalized anxiety disorder: Secondary | ICD-10-CM

## 2012-07-09 DIAGNOSIS — F329 Major depressive disorder, single episode, unspecified: Secondary | ICD-10-CM

## 2012-07-09 MED ORDER — SERTRALINE HCL 50 MG PO TABS: 50.0000 mg | ORAL_TABLET | Freq: Every day | ORAL | Status: DC

## 2012-07-09 MED ORDER — CLONAZEPAM 0.5 MG PO TABS: 0.5000 mg | ORAL_TABLET | Freq: Two times a day (BID) | ORAL | Status: DC | PRN

## 2012-07-09 NOTE — Progress Notes (Signed)
  Subjective:    Patient ID: [name], female    DOB: Jan 20, 1966, 46 y.o.   MRN: [id number]  HPI  Progressive depression and anxiety symptoms. She's had past history depression. Recently dealing with increased stress with work and also ex-husband who has recently quit paying child support due to his business going under. This has created tremendous stress. Having difficulty sleeping. Frequent crying spells. No suicidal ideation. Increased depressive symptoms over several weeks. Currently not taking any antidepressants. She has daily anxiety. Tremendous difficulty sleeping. No excessive caffeine use. No alcohol use.  Asthma which is stable. She's been treated for hypertension previously but currently off medication. She feels blood pressure elevated currently secondary to stress issues.   Review of Systems  Constitutional: Negative for appetite change and unexpected weight change.  Respiratory: Negative for shortness of breath.   Cardiovascular: Negative for chest pain.  Psychiatric/Behavioral: Positive for disturbed wake/sleep cycle and dysphoric mood. Negative for suicidal ideas, confusion and agitation. The patient is nervous/anxious.        Objective:   Physical Exam  Constitutional:       Patient is alert and appropriate interaction but anxious and crying off and on during interview  Cardiovascular: Normal rate and regular rhythm.   Pulmonary/Chest: Effort normal and breath sounds normal. No respiratory distress. She has no wheezes. She has no rales.          Assessment & Plan:  Recurrent depression. She has significant anxiety symptoms and increased insomnia. Start sertraline 50 mg daily. Short term only use of clonazepam 0.5 mg each bedtime as needed. Discussed possible counseling. Reassess in 3 weeks and sooner as needed

## 2012-07-20 ENCOUNTER — Ambulatory Visit: Payer: BC Managed Care – PPO

## 2012-07-24 ENCOUNTER — Encounter: Payer: Self-pay | Admitting: Family Medicine

## 2012-07-24 ENCOUNTER — Ambulatory Visit: Payer: BC Managed Care – PPO | Admitting: Family Medicine

## 2012-07-24 ENCOUNTER — Ambulatory Visit (INDEPENDENT_AMBULATORY_CARE_PROVIDER_SITE_OTHER): Payer: BC Managed Care – PPO | Admitting: Family Medicine

## 2012-07-24 VITALS — BP 130/90 | Temp 98.3°F

## 2012-07-24 DIAGNOSIS — J069 Acute upper respiratory infection, unspecified: Secondary | ICD-10-CM

## 2012-07-24 DIAGNOSIS — J45909 Unspecified asthma, uncomplicated: Secondary | ICD-10-CM

## 2012-07-24 MED ORDER — FLUTICASONE PROPIONATE 50 MCG/ACT NA SUSP: 2.0000 | Freq: Every day | NASAL | Status: DC

## 2012-07-24 NOTE — Progress Notes (Signed)
  Subjective:    Patient ID: [name], female    DOB: 1966/10/22, 46 y.o.   MRN: [id number]  HPI  One week history of coughing and sneezing. Cough mostly dry. She's had some increased malaise and diffuse body aches. Mild sore throat. No nausea or vomiting. She has asthma which has been well controlled with Symbicort. She's not experiencing any major dyspnea. She initially thought this was more allergies than now thinks this may be more of a viral issue. Nonsmoker.  Recently initiated sertraline for anxiety and depression symptoms. Thus far has not seen much improvement. She's been on this for 2 weeks now. She still has difficulty sleeping anf using low-dose clonazepam. She's dealing with tremendous stressors with her ex-husband. She has been involved in counseling. No suicidal ideation.   Review of Systems  Constitutional: Positive for fatigue. Negative for fever and chills.  HENT: Positive for congestion, sore throat, sneezing and postnasal drip.   Respiratory: Positive for cough. Negative for wheezing.   Cardiovascular: Negative for chest pain.  Neurological: Negative for headaches.       Objective:   Physical Exam  Constitutional: She appears well-developed and well-nourished.  HENT:  Right Ear: External ear normal.  Left Ear: External ear normal.  Mouth/Throat: Oropharynx is clear and moist.  Neck: Neck supple.  Cardiovascular: Normal rate and regular rhythm.   Pulmonary/Chest: Effort normal and breath sounds normal. No respiratory distress. She has no wheezes. She has no rales.  Lymphadenopathy:    She has no cervical adenopathy.          Assessment & Plan:  #1 probable viral URI. Question component of allergic rhinitis. Flonase nasal 2 sprays per nostril once daily. Tylenol and plenty of fluids. #2 asthma which is well-controlled. No major reactive airway component at this time #3 depression/anxiety. Continue sertraline. Touch base one week if no further  improvement. Consider titration then as necessary.

## 2012-07-24 NOTE — Patient Instructions (Addendum)

## 2012-07-27 ENCOUNTER — Other Ambulatory Visit: Payer: Self-pay | Admitting: Family Medicine

## 2012-07-27 DIAGNOSIS — F988 Other specified behavioral and emotional disorders with onset usually occurring in childhood and adolescence: Secondary | ICD-10-CM

## 2012-07-27 NOTE — Telephone Encounter (Signed)
Pt needs new rx generic adderall xr 20 mg °

## 2012-07-27 NOTE — Telephone Encounter (Signed)
Pt lost her last of 3 Rx, so it was approved to give her another.  Last refill #30 with 0 refills on 8-26.

## 2012-07-30 ENCOUNTER — Telehealth: Payer: Self-pay | Admitting: Family Medicine

## 2012-07-30 MED ORDER — AMPHETAMINE-DEXTROAMPHET ER 20 MG PO CP24: 20.0000 mg | ORAL_CAPSULE | ORAL | Status: DC

## 2012-07-30 MED ORDER — AMPHETAMINE-DEXTROAMPHET ER 20 MG PO CP24: 20.0000 mg | ORAL_CAPSULE | Freq: Every day | ORAL | Status: DC

## 2012-07-30 NOTE — Telephone Encounter (Signed)
2nd phone note, done earlier today

## 2012-07-30 NOTE — Telephone Encounter (Signed)
Pt informed Rx ready for pick up. 

## 2012-07-30 NOTE — Telephone Encounter (Signed)
Patient called stating that she need a refill of her adderall. Please assist.  °

## 2012-07-30 NOTE — Telephone Encounter (Signed)
May refill 

## 2012-08-10 ENCOUNTER — Other Ambulatory Visit: Payer: Self-pay | Admitting: Family Medicine

## 2012-08-12 ENCOUNTER — Encounter: Payer: Self-pay | Admitting: Family Medicine

## 2012-08-12 ENCOUNTER — Ambulatory Visit (INDEPENDENT_AMBULATORY_CARE_PROVIDER_SITE_OTHER): Payer: BC Managed Care – PPO | Admitting: Family Medicine

## 2012-08-12 VITALS — BP 150/90 | Temp 98.3°F

## 2012-08-12 DIAGNOSIS — I10 Essential (primary) hypertension: Secondary | ICD-10-CM

## 2012-08-12 DIAGNOSIS — F988 Other specified behavioral and emotional disorders with onset usually occurring in childhood and adolescence: Secondary | ICD-10-CM

## 2012-08-12 DIAGNOSIS — F411 Generalized anxiety disorder: Secondary | ICD-10-CM

## 2012-08-12 MED ORDER — ALPRAZOLAM 0.25 MG PO TABS: 0.2500 mg | ORAL_TABLET | Freq: Three times a day (TID) | ORAL | Status: DC | PRN

## 2012-08-12 NOTE — Progress Notes (Signed)
  Subjective:    Patient ID: [name], female    DOB: 10/28/1966, 46 y.o.   MRN: [id number]  HPI  Elevated blood pressure and increased stress. Patient dealing with some court issues regarding her ex-husband. She has court date tomorrow. We started sertraline recently which think is helping some. She tried clonazepam at night but still had difficulty sleeping. Overall she feels her mood is somewhat improved. She has taken Adderall for attention deficit disorder but recently had some issues with increased blood pressure. She has hypertension history but stopped taking Benicar sometime back after running out of samples. Blood pressures have been well controlled on Benicar with no side effects. No recent chest pains. No dizziness.   Review of Systems  Constitutional: Negative for fatigue.  Eyes: Negative for visual disturbance.  Respiratory: Negative for cough, chest tightness, shortness of breath and wheezing.   Cardiovascular: Negative for chest pain, palpitations and leg swelling.  Neurological: Negative for dizziness, seizures, syncope, weakness, light-headedness and headaches.  Psychiatric/Behavioral: The patient is nervous/anxious.        Objective:   Physical Exam  Constitutional: She appears well-developed and well-nourished.  Cardiovascular: Normal rate and regular rhythm.   Pulmonary/Chest: Effort normal and breath sounds normal. No respiratory distress. She has no wheezes. She has no rales.  Musculoskeletal: She exhibits no edema.          Assessment & Plan:  #1 hypertension. Currently untreated. Start back Benicar 20 mg daily with samples given. Hold Adderall for now unless blood pressure is well controlled. We discussed measures to reduce stress such as walking.  Reassess BP here within one month. #2 history of recurrent depression. Improved on sertraline.

## 2012-08-17 ENCOUNTER — Telehealth: Payer: Self-pay | Admitting: Family Medicine

## 2012-08-17 MED ORDER — AMPHETAMINE-DEXTROAMPHETAMINE 10 MG PO TABS: 10.0000 mg | ORAL_TABLET | Freq: Every day | ORAL | Status: DC | PRN

## 2012-08-17 NOTE — Telephone Encounter (Signed)
Pt informed on VM our records show Adderall last filled 7/19, so due 10/19, can fill on 10/17 or 10/18.

## 2012-08-17 NOTE — Telephone Encounter (Signed)
Pt called and said that the [facility] on [address], told pt that the last fill date on amphetamine-dextroamphetamine (ADDERALL) 10 MG tablet was 07/19/12. Pt said that script is due now.

## 2012-08-17 NOTE — Telephone Encounter (Signed)
Pt informed Rx ready to pick-up, I also asked her to continue to monitor BP, she voiced understanding and is scheduled for BP check 11/8

## 2012-08-17 NOTE — Telephone Encounter (Signed)
Requesting rx refill of adderal 10mg .  States it was not filled last week due to bp being elevated.  Started the benicar and it seems to be working.  Latest bp readings were:133/77, 133/88, 140/90

## 2012-08-20 ENCOUNTER — Emergency Department (HOSPITAL_COMMUNITY)
Admission: EM | Admit: 2012-08-20 | Discharge: 2012-08-20 | Disposition: A | Discharge status: Home / Self Care | Payer: BC Managed Care – PPO | Attending: Emergency Medicine | Admitting: Emergency Medicine

## 2012-08-20 ENCOUNTER — Encounter (HOSPITAL_COMMUNITY): Payer: Self-pay | Admitting: Emergency Medicine

## 2012-08-20 DIAGNOSIS — K219 Gastro-esophageal reflux disease without esophagitis: Secondary | ICD-10-CM | POA: Insufficient documentation

## 2012-08-20 DIAGNOSIS — I10 Essential (primary) hypertension: Secondary | ICD-10-CM | POA: Insufficient documentation

## 2012-08-20 DIAGNOSIS — J45901 Unspecified asthma with (acute) exacerbation: Secondary | ICD-10-CM | POA: Insufficient documentation

## 2012-08-20 DIAGNOSIS — J45909 Unspecified asthma, uncomplicated: Secondary | ICD-10-CM

## 2012-08-20 DIAGNOSIS — F329 Major depressive disorder, single episode, unspecified: Secondary | ICD-10-CM | POA: Insufficient documentation

## 2012-08-20 DIAGNOSIS — F3289 Other specified depressive episodes: Secondary | ICD-10-CM | POA: Insufficient documentation

## 2012-08-20 DIAGNOSIS — Z888 Allergy status to other drugs, medicaments and biological substances status: Secondary | ICD-10-CM | POA: Insufficient documentation

## 2012-08-20 MED ORDER — PREDNISONE 20 MG PO TABS
60.0000 mg | ORAL_TABLET | Freq: Once | ORAL | Status: AC
  Administered 2012-08-20: 60 mg via ORAL
  Filled 2012-08-20: qty 3

## 2012-08-20 MED ORDER — PREDNISONE 20 MG PO TABS: 20.0000 mg | ORAL_TABLET | Freq: Two times a day (BID) | ORAL | Status: DC

## 2012-08-20 MED ORDER — IPRATROPIUM BROMIDE 0.02 % IN SOLN
0.5000 mg | Freq: Once | RESPIRATORY_TRACT | Status: AC
  Administered 2012-08-20: 0.5 mg via RESPIRATORY_TRACT
  Filled 2012-08-20: qty 2.5

## 2012-08-20 MED ORDER — ALBUTEROL SULFATE (5 MG/ML) 0.5% IN NEBU
5.0000 mg | INHALATION_SOLUTION | Freq: Once | RESPIRATORY_TRACT | Status: AC
  Administered 2012-08-20: 5 mg via RESPIRATORY_TRACT
  Filled 2012-08-20: qty 1

## 2012-08-20 NOTE — ED Provider Notes (Signed)
History     CSN: [id number]  Arrival date & time 08/20/12  7829   First MD Initiated Contact with Patient 08/20/12 [phone]      Chief Complaint  Patient presents with  . Asthma    (Consider location/radiation/quality/duration/timing/severity/associated sxs/prior treatment) HPI Comments: [name] is a 46 y.o. Female who presents for shortness of breath tonight. It did not respond her usual treatment of Symbicort and albuterol inhaler. She did not have her nebulizer, and so she decided to come to the emergency room to get a treatment. She denies cough, chest pain, nausea, vomiting, weakness, or dizziness. There's been no fever, or chills. There are no modifying factors.  Patient is a 46 y.o. female presenting with asthma. The history is provided by the patient.  Asthma    Past Medical History  Diagnosis Date  . Depression   . Hypertension   . Asthma   . GERD (gastroesophageal reflux disease)   . Hepatitis A   . Jaundice   . Blood transfusion   . Migraines   . UTI (urinary tract infection)   . Ovarian cyst     Past Surgical History  Procedure Date  . Appendectomy 1994  . Ovarian cyst removal   . Tonsillectomy and adenoidectomy     Family History  Problem Relation Age of Onset  . Asthma Son   . Hypertension Other     History  Substance Use Topics  . Smoking status: Never Smoker   . Smokeless tobacco: Never Used  . Alcohol Use: No    OB History    Grav Para Term Preterm Abortions TAB SAB Ect Mult Living                  Review of Systems  All other systems reviewed and are negative.    Allergies  Aspirin; Codeine sulfate; and Hydrocodone-acetaminophen  Home Medications   Current Outpatient Rx  Name Route Sig Dispense Refill  . BUDESONIDE-FORMOTEROL FUMARATE 160-4.5 MCG/ACT IN AERO Inhalation Inhale 2 puffs into the lungs 2 (two) times daily. 10.2 g 3  . OLMESARTAN MEDOXOMIL 20 MG PO TABS Oral Take 20 mg by mouth daily.    Marland Kitchen OMEPRAZOLE 20  MG PO CPDR Oral Take 20 mg by mouth daily.      . SERTRALINE HCL 50 MG PO TABS Oral Take 1 tablet (50 mg total) by mouth daily. 30 tablet 3  . VENTOLIN HFA 108 (90 BASE) MCG/ACT IN AERS  use as directed 6.7 g 5  . PREDNISONE 20 MG PO TABS Oral Take 1 tablet (20 mg total) by mouth 2 (two) times daily. 10 tablet 0    BP 140/95  Pulse 118  Temp 97.6 F (36.4 C) (Oral)  Resp 20  SpO2 97%  LMP 07/30/2012  Physical Exam  Nursing note and vitals reviewed. Constitutional: She is oriented to person, place, and time. She appears well-developed and well-nourished.  HENT:  Head: Normocephalic and atraumatic.  Eyes: Conjunctivae normal and EOM are normal. Pupils are equal, round, and reactive to light.  Neck: Normal range of motion and phonation normal. Neck supple.  Cardiovascular: Normal rate, regular rhythm and intact distal pulses.   Pulmonary/Chest: Effort normal. No respiratory distress. She has no wheezes. She has no rales. She exhibits no tenderness.       Audible raspy breath sounds. No increased work of breathing.  Abdominal: Soft. She exhibits no distension. There is no tenderness. There is no guarding.  Musculoskeletal: Normal range of  motion.  Neurological: She is alert and oriented to person, place, and time. She has normal strength. She exhibits normal muscle tone.  Skin: Skin is warm and dry.  Psychiatric: She has a normal mood and affect. Her behavior is normal. Judgment and thought content normal.    ED Course  Procedures (including critical care time)   Emergency department treatment: Prednisone, and nebulizer.   Labs Reviewed - No data to display No results found.   1. Asthma       MDM  Exacerbation of asthma, no clear cause. Vital signs are stable.Doubt metabolic instability, serious bacterial infection or impending vascular collapse; the patient is stable for discharge.   Plan: Home Medications- Prednisone burst; Home Treatments- rest; Recommended follow  up- PCP prn        Flint Melter, MD 08/20/12 [phone]

## 2012-08-20 NOTE — ED Notes (Signed)
PT. REPORTS ASTHMA  ATTACK / WHEEZING THIS EVENING UNRELIEVED BY ALBUTEROL INHALER .

## 2012-08-21 ENCOUNTER — Ambulatory Visit: Payer: BC Managed Care – PPO

## 2012-08-25 ENCOUNTER — Ambulatory Visit: Payer: BC Managed Care – PPO

## 2012-08-31 ENCOUNTER — Other Ambulatory Visit: Payer: Self-pay | Admitting: Family Medicine

## 2012-09-01 NOTE — Telephone Encounter (Signed)
Clonazepam last filled 07-09-12, #30 with 1 refill, sig: one tab bid prn

## 2012-09-01 NOTE — Telephone Encounter (Signed)
Refill once.  Try to avoid regular use. 

## 2012-09-01 NOTE — Telephone Encounter (Signed)
Call[facility] to [facility]

## 2012-09-01 NOTE — Telephone Encounter (Signed)
Pt called to check on status of refill of Clonazepam. Pt was told by the nurse that this would be called in today. Pls call in [facility] on [address].

## 2012-09-08 ENCOUNTER — Ambulatory Visit (INDEPENDENT_AMBULATORY_CARE_PROVIDER_SITE_OTHER): Payer: BC Managed Care – PPO | Admitting: Family Medicine

## 2012-09-08 ENCOUNTER — Encounter: Payer: Self-pay | Admitting: Family Medicine

## 2012-09-08 VITALS — BP 130/100 | Temp 97.7°F

## 2012-09-08 DIAGNOSIS — Z23 Encounter for immunization: Secondary | ICD-10-CM

## 2012-09-08 DIAGNOSIS — F988 Other specified behavioral and emotional disorders with onset usually occurring in childhood and adolescence: Secondary | ICD-10-CM

## 2012-09-08 DIAGNOSIS — F3289 Other specified depressive episodes: Secondary | ICD-10-CM

## 2012-09-08 DIAGNOSIS — Z299 Encounter for prophylactic measures, unspecified: Secondary | ICD-10-CM

## 2012-09-08 DIAGNOSIS — F329 Major depressive disorder, single episode, unspecified: Secondary | ICD-10-CM

## 2012-09-08 DIAGNOSIS — I10 Essential (primary) hypertension: Secondary | ICD-10-CM

## 2012-09-08 DIAGNOSIS — F411 Generalized anxiety disorder: Secondary | ICD-10-CM

## 2012-09-08 MED ORDER — AMPHETAMINE-DEXTROAMPHETAMINE 10 MG PO TABS: 10.0000 mg | ORAL_TABLET | Freq: Every day | ORAL | Status: DC

## 2012-09-08 MED ORDER — TETANUS-DIPHTH-ACELL PERTUSSIS 5-2.5-18.5 LF-MCG/0.5 IM SUSP: 0.5000 mL | Freq: Once | INTRAMUSCULAR | Status: DC

## 2012-09-08 NOTE — Progress Notes (Signed)
  Subjective:    Patient ID: [name], female    DOB: Jul 16, 1966, 46 y.o.   MRN: [id number]  HPI  Patient here for medical followup. She has history of depression and anxiety. Situational stressors as previously outlined. She currently takes sertraline 50 mg daily and this has helped with her anxiety symptoms. She continues to have some insomnia and takes low-dose clonazepam intermittently. She has court dates coming up soon and his had some anxiety regarding anticipation of that.  Her asthma has been well-controlled with Symbicort. She needs flu vaccine. We cannot confirm prior Pneumovax.  Hypertension treated with Benicar 20 mg once daily. Blood pressures around 130/80 by home readings. No headaches. No dizziness. No medications side effects.  Hx of ADD.  On Adderall which is working well.  Needs refills of 10 mg dose.  No headaches or other side effects.  Past Medical History  Diagnosis Date  . Depression   . Hypertension   . Asthma   . GERD (gastroesophageal reflux disease)   . Hepatitis A   . Jaundice   . Blood transfusion   . Migraines   . UTI (urinary tract infection)   . Ovarian cyst    Past Surgical History  Procedure Date  . Appendectomy 1994  . Ovarian cyst removal   . Tonsillectomy and adenoidectomy     reports that she has never smoked. She has never used smokeless tobacco. She reports that she does not drink alcohol or use illicit drugs. family history includes Asthma in her son and Hypertension in her other. Allergies  Allergen Reactions  . Aspirin     REACTION: wheezing problems  . Codeine Sulfate     REACTION: rash, itiching  . Hydrocodone-Acetaminophen Itching and Rash      Review of Systems  Constitutional: Negative for fever, chills and fatigue.  Eyes: Negative for visual disturbance.  Respiratory: Negative for cough, chest tightness, shortness of breath and wheezing.   Cardiovascular: Negative for chest pain, palpitations and leg  swelling.  Neurological: Negative for dizziness, seizures, syncope, weakness, light-headedness and headaches.       Objective:   Physical Exam  Constitutional: She appears well-developed and well-nourished.  Cardiovascular: Normal rate and regular rhythm.   Pulmonary/Chest: Effort normal and breath sounds normal. No respiratory distress. She has no wheezes. She has no rales.  Musculoskeletal: She exhibits no edema.  Psychiatric: She has a normal mood and affect. Her behavior is normal.          Assessment & Plan:  #1 hypertension. Adequately controlled. Provided further samples of Benicar 20 mg once daily #2 history of asthma. Currently stable. Influenza vaccine and Pneumovax given (could not confirm prior pneumovax).  #3 history of ADD. Stable on Adderall. #4 depression. Improved on sertraline. We discussed possible titration to 75 mg and at this point she wishes to continue current dose. Both she and her children are in counseling regarding issues with her child custody issues with ex-husband.

## 2012-09-11 ENCOUNTER — Ambulatory Visit: Payer: BC Managed Care – PPO | Admitting: Family Medicine

## 2012-09-22 ENCOUNTER — Telehealth: Payer: Self-pay | Admitting: Family Medicine

## 2012-09-22 DIAGNOSIS — E162 Hypoglycemia, unspecified: Secondary | ICD-10-CM

## 2012-09-22 NOTE — Telephone Encounter (Signed)
Pt needs new rx generic adderall xr 20 mg once daily.

## 2012-09-22 NOTE — Telephone Encounter (Addendum)
Pt got a well check at place of employment. Her sugar came back at 54.  She had forgotten about test and had eaten too.  They have requested her doctor recheck. Can we schedule lab? Her phone no is [phone]

## 2012-09-22 NOTE — Telephone Encounter (Signed)
Our records show last filled 07-30-12, #30 with 2 refills, too early?

## 2012-09-23 NOTE — Telephone Encounter (Signed)
Please advise 

## 2012-09-23 NOTE — Telephone Encounter (Signed)
Pt informed on personally identified VM to come for 8 hour fasting CBG

## 2012-09-23 NOTE — Telephone Encounter (Signed)
I spoke with pt [facility] [address], they have the 3rd Rx on file for her to fill on 9-26.  Pt informed

## 2012-09-23 NOTE — Telephone Encounter (Signed)
Schedule fasting CBG and make sure she is at least 8 hours fasting.  Plain water OK

## 2012-09-23 NOTE — Telephone Encounter (Signed)
LMTCB re: our records show 3 Rx given on 07-30-12, her request is one month early?

## 2012-09-24 ENCOUNTER — Other Ambulatory Visit (INDEPENDENT_AMBULATORY_CARE_PROVIDER_SITE_OTHER): Payer: BC Managed Care – PPO

## 2012-09-24 DIAGNOSIS — E162 Hypoglycemia, unspecified: Secondary | ICD-10-CM

## 2012-09-24 LAB — POCT CBG (FASTING - GLUCOSE)-MANUAL ENTRY: Glucose Fasting, POC: 80 mg/dL (ref 70–99)

## 2012-10-05 ENCOUNTER — Telehealth: Payer: Self-pay | Admitting: Family Medicine

## 2012-10-05 NOTE — Telephone Encounter (Signed)
Adderall 10 mg last filled 09-08-12, #30 with 0 refills

## 2012-10-05 NOTE — Telephone Encounter (Signed)
Pt called req refill of amphetamine-dextroamphetamine (ADDERALL) 10 MG tablet  ° °

## 2012-10-05 NOTE — Telephone Encounter (Signed)
Can we fill Adderall 10 mg for 3 months now?

## 2012-10-05 NOTE — Telephone Encounter (Signed)
yes

## 2012-10-07 MED ORDER — AMPHETAMINE-DEXTROAMPHETAMINE 10 MG PO TABS: 10.0000 mg | ORAL_TABLET | Freq: Every day | ORAL | Status: DC

## 2012-10-07 NOTE — Telephone Encounter (Signed)
Pt informed ready to pick up

## 2012-10-09 ENCOUNTER — Other Ambulatory Visit: Payer: Self-pay | Admitting: Family Medicine

## 2012-10-19 ENCOUNTER — Other Ambulatory Visit: Payer: Self-pay | Admitting: Family Medicine

## 2012-10-19 DIAGNOSIS — F988 Other specified behavioral and emotional disorders with onset usually occurring in childhood and adolescence: Secondary | ICD-10-CM

## 2012-10-19 NOTE — Telephone Encounter (Addendum)
Pt needs refill of Adderall 20 mg.

## 2012-10-22 MED ORDER — AMPHETAMINE-DEXTROAMPHETAMINE 20 MG PO TABS: 20.0000 mg | ORAL_TABLET | Freq: Every day | ORAL | Status: DC

## 2012-10-22 MED ORDER — AMPHETAMINE-DEXTROAMPHET ER 20 MG PO CP24: 20.0000 mg | ORAL_CAPSULE | ORAL | Status: DC

## 2012-10-22 MED ORDER — AMPHETAMINE-DEXTROAMPHETAMINE 10 MG PO TABS: 10.0000 mg | ORAL_TABLET | Freq: Every day | ORAL | Status: DC

## 2012-10-22 NOTE — Telephone Encounter (Signed)
Patient called to see if refill was available for pick up as she is going out of town today.

## 2012-10-22 NOTE — Telephone Encounter (Signed)
Informed pt our office is closing today at 2pm for Christmas Party.  The refill request was a few days early, so we were going to print on Friday, not knowing she was leaving out of town today

## 2012-11-19 ENCOUNTER — Other Ambulatory Visit: Payer: Self-pay | Admitting: Family Medicine

## 2012-11-19 NOTE — Telephone Encounter (Signed)
Pt has adderall 20 mg and adderall xr 20 on pt chart as well as adderall 10 mg for pm use.   Adderall xr 20 last filled 10-22-12

## 2012-11-19 NOTE — Telephone Encounter (Signed)
We need to make sure Adderall 20 mg (not XR) is OFF med list OK to refill generic Adderall XR 20 for 3 months.

## 2012-11-19 NOTE — Telephone Encounter (Signed)
Pt needs generic adderall xr 20 m g °

## 2012-11-20 MED ORDER — AMPHETAMINE-DEXTROAMPHET ER 20 MG PO CP24: 20.0000 mg | ORAL_CAPSULE | ORAL | Status: DC

## 2012-11-20 NOTE — Telephone Encounter (Signed)
Pt informed Rx ready for pick up. 

## 2012-11-20 NOTE — Addendum Note (Signed)
Addended by: Melchor Amour on: 11/20/2012 08:54 AM   Modules accepted: Orders

## 2012-12-30 ENCOUNTER — Telehealth: Payer: Self-pay | Admitting: Family Medicine

## 2012-12-30 NOTE — Telephone Encounter (Signed)
Pt requesting Adderall 10 mg, last filled 10/07/12, #30 with 2 refills

## 2012-12-30 NOTE — Telephone Encounter (Signed)
Patient called stating that she need a refill of her adderall 10mg  1poqd prn. Please assist. Patient states she need this by Friday as she is going out of town.

## 2012-12-31 NOTE — Telephone Encounter (Signed)
Refill OK

## 2013-01-01 MED ORDER — AMPHETAMINE-DEXTROAMPHETAMINE 10 MG PO TABS: 10.0000 mg | ORAL_TABLET | Freq: Every day | ORAL | Status: DC

## 2013-01-01 NOTE — Telephone Encounter (Signed)
Pt informed on VM Rx ready to pick-up 

## 2013-02-15 ENCOUNTER — Telehealth: Payer: Self-pay | Admitting: Family Medicine

## 2013-02-15 NOTE — Telephone Encounter (Signed)
OK to refill then.

## 2013-02-15 NOTE — Telephone Encounter (Signed)
Adderall XR 20 mg last filled 11/20/12, #30 with 2 refills.  Wednesday is the 16th.

## 2013-02-15 NOTE — Telephone Encounter (Signed)
Pt needs refill of amphetamine-dextroamphetamine (ADDERALL XR) 20 MG 24 hr. Pt would like to pick up Wednesday, 02/17/13.

## 2013-02-17 MED ORDER — AMPHETAMINE-DEXTROAMPHET ER 20 MG PO CP24: 20.0000 mg | ORAL_CAPSULE | ORAL | Status: DC

## 2013-02-17 NOTE — Telephone Encounter (Signed)
Pt informed Rx ready to pick-up on VM 

## 2013-03-16 ENCOUNTER — Telehealth: Payer: Self-pay | Admitting: Family Medicine

## 2013-03-16 NOTE — Telephone Encounter (Signed)
PT called and requested a refill of her amphetamine-dextroamphetamine (ADDERALL XR) 20 MG 24 hr capsule. Please assist.

## 2013-03-17 NOTE — Telephone Encounter (Signed)
I informed pt on her personally identified that her Adderall XR 20 mg was just filled for 3 months on 02-17-13, too early.

## 2013-03-19 ENCOUNTER — Telehealth: Payer: Self-pay

## 2013-03-19 ENCOUNTER — Encounter: Payer: Self-pay | Admitting: Family Medicine

## 2013-03-19 ENCOUNTER — Ambulatory Visit (INDEPENDENT_AMBULATORY_CARE_PROVIDER_SITE_OTHER): Payer: BC Managed Care – PPO | Admitting: Family Medicine

## 2013-03-19 VITALS — BP 132/90 | HR 110 | Temp 99.2°F

## 2013-03-19 DIAGNOSIS — J069 Acute upper respiratory infection, unspecified: Secondary | ICD-10-CM

## 2013-03-19 DIAGNOSIS — I10 Essential (primary) hypertension: Secondary | ICD-10-CM

## 2013-03-19 DIAGNOSIS — J45901 Unspecified asthma with (acute) exacerbation: Secondary | ICD-10-CM

## 2013-03-19 MED ORDER — PREDNISONE 20 MG PO TABS: 20.0000 mg | ORAL_TABLET | Freq: Every day | ORAL | Status: DC

## 2013-03-19 MED ORDER — OLMESARTAN MEDOXOMIL 20 MG PO TABS: 20.0000 mg | ORAL_TABLET | Freq: Every day | ORAL | Status: DC

## 2013-03-19 NOTE — Progress Notes (Signed)
Chief Complaint  Patient presents with  . Sore Throat    sob and wheezing, started yesterday.     HPI:  Asthma: -started yesterday -symptoms: nasal congestion, cough, itchy throat, mild SOB and wheezing -has allergies and asthma: takes symbicort and alb prn (usually doesn't need to use the albuterol), benadryl - cough is not very productive -denies: fevers, NVD, tooth pain, ear pain -has flares of asthma a few times per year - last year was last hospitalization, reports numerous hospitalizations for asthma in the past and wants to get on prednisone before gets worse.  -does not want to take abx -not pregnant, periods regular, FDLMP 02/27/13  HTN: -taking benicar -running out - needs refills -denies: CP, swelling, palpitations  ROS: See pertinent positives and negatives per HPI.  Past Medical History  Diagnosis Date  . Depression   . Hypertension   . Asthma   . GERD (gastroesophageal reflux disease)   . Hepatitis A   . Jaundice   . Blood transfusion   . Migraines   . UTI (urinary tract infection)   . Ovarian cyst     Family History  Problem Relation Age of Onset  . Asthma Son   . Hypertension Other     History   Social History  . Marital Status: Divorced    Spouse Name: N/A    Number of Children: N/A  . Years of Education: N/A   Occupational History  . Building surveyor Rep for [employer]    Social History Main Topics  . Smoking status: Never Smoker   . Smokeless tobacco: Never Used  . Alcohol Use: No  . Drug Use: No  . Sexually Active: None   Other Topics Concern  . None   Social History Narrative  . None    Current outpatient prescriptions:amphetamine-dextroamphetamine (ADDERALL XR) 20 MG 24 hr capsule, Take 1 capsule (20 mg total) by mouth every morning., Disp: 30 capsule, Rfl: 0;  amphetamine-dextroamphetamine (ADDERALL XR) 20 MG 24 hr capsule, Take 1 capsule (20 mg total) by mouth every morning., Disp: 30 capsule, Rfl: 0 amphetamine-dextroamphetamine  (ADDERALL XR) 20 MG 24 hr capsule, Take 1 capsule (20 mg total) by mouth every morning., Disp: 30 capsule, Rfl: 0;  amphetamine-dextroamphetamine (ADDERALL) 10 MG tablet, Take 1 tablet (10 mg total) by mouth daily., Disp: 30 tablet, Rfl: 0;  amphetamine-dextroamphetamine (ADDERALL) 10 MG tablet, Take 1 tablet (10 mg total) by mouth daily., Disp: 30 tablet, Rfl: 0 amphetamine-dextroamphetamine (ADDERALL) 10 MG tablet, Take 1 tablet (10 mg total) by mouth daily., Disp: 30 tablet, Rfl: 0;  budesonide-formoterol (SYMBICORT) 160-4.5 MCG/ACT inhaler, Inhale 2 puffs into the lungs 2 (two) times daily., Disp: 10.2 g, Rfl: 3;  clonazePAM (KLONOPIN) 0.5 MG tablet, take 1 tablet by mouth twice a day if needed for anxiety, Disp: 30 tablet, Rfl: 0 olmesartan (BENICAR) 20 MG tablet, Take 1 tablet (20 mg total) by mouth daily., Disp: 90 tablet, Rfl: 1;  omeprazole (PRILOSEC) 20 MG capsule, Take 20 mg by mouth daily.  , Disp: , Rfl: ;  sertraline (ZOLOFT) 50 MG tablet, take 1 tablet by mouth once daily, Disp: 90 tablet, Rfl: 3;  VENTOLIN HFA 108 (90 BASE) MCG/ACT inhaler, use as directed, Disp: 6.7 g, Rfl: 5 predniSONE (DELTASONE) 20 MG tablet, Take 1 tablet (20 mg total) by mouth daily., Disp: 10 tablet, Rfl: 0  EXAM:  Filed Vitals:   03/19/13 1403  BP: 132/90  Pulse: 110  Temp: 99.2 F (37.3 C)   O2 sats:  96% Body mass index is 0.00 kg/(m^2).  GENERAL: vitals reviewed and listed above, alert, oriented, appears well hydrated and in no acute distress  HEENT: atraumatic, conjunttiva clear, no obvious abnormalities on inspection of external nose and ears, normal appearance of ear canals and TMs, clear nasal congestion, mild post oropharyngeal erythema with PND, no tonsillar edema or exudate, no sinus TTP  NECK: no obvious masses on inspection  LUNGS: clear to auscultation bilaterally, no wheezes, rales or rhonchi, good air movement  CV: HRRR, no peripheral edema  MS: moves all extremities without noticeable  abnormality  PSYCH: pleasant and cooperative, no obvious depression or anxiety  ASSESSMENT AND PLAN:  Discussed the following assessment and plan:  Asthma with acute exacerbation - Plan: predniSONE (DELTASONE) 20 MG tablet  Upper respiratory infection  HYPERTENSION - Plan: olmesartan (BENICAR) 20 MG tablet  -VURI with bronchial symptoms, no resp distress, lung exam normal and O2 on RA ok. Given asthma hx and symptoms will do prednisone burst - risks discussed, return and ED precautions discussed -no signs or smx to suggest bacterial infection and she refuses abx -refilled BP medication, discussed risks -Patient advised to return or notify a doctor immediately if symptoms worsen or persist or new concerns arise.  There are no Patient Instructions on file for this visit.   [name].

## 2013-03-19 NOTE — Patient Instructions (Addendum)
Asthma, Adult  Asthma is a disease of the lungs and can make it hard to breathe. Asthma cannot be cured, but medicine can help control it. Asthma may be started (triggered) by:   Pollen.   Dust.   Animal skin flakes (dander).   Molds.   Foods.   Respiratory infections (colds, flu).   Smoke.   Exercise.   Stress.   Other things that cause allergic reactions or allergies (allergens).  HOME CARE    Talk to your doctor about how to manage your attacks at home. This may include:   Using a tool called a peak flow meter.   Having medicine ready to stop the attack.   Take all medicine as told by your doctor.   Wash bed sheets and blankets every week in hot water and put them in the dryer.   Drink enough fluids to keep your pee (urine) clear or pale yellow.   Always be ready to get emergency help. Write down the phone number for your doctor. Keep it where you can easily find it.   Talk about exercise routines with your doctor.   If animal dander is causing your asthma, you may need to find a new home for your pet(s).  GET HELP RIGHT AWAY IF:    You have muscle aches.   You cough more.   You have chest pain.   You have thick spit (sputum) that changes to yellow, green, gray, or bloody.   Medicine does not stop your wheezing.   You have problems breathing.   You have a fever.   Your medicine causes:   A rash.   Itching.   Puffiness (swelling).   Breathing problems.  MAKE SURE YOU:    Understand these instructions.   Will watch your condition.   Will get help right away if you are not doing well or get worse.  Document Released: 04/08/2008 Document Revised: 01/13/2012 Document Reviewed: 08/31/2008  [employer] Patient Information 2013 [employer].

## 2013-03-19 NOTE — Telephone Encounter (Signed)
Pt came in for OV with Dr. Selena Batten and request to have benicar samples or an rx sent to pharmacy.  Called pt and advised that I had a few Benicar 20 mg samples and that an rx would be sent to pharmacy just in case pt needed more.

## 2013-03-24 ENCOUNTER — Telehealth: Payer: Self-pay | Admitting: Family Medicine

## 2013-03-24 NOTE — Telephone Encounter (Signed)
Request is a week early, last filled 01/01/13

## 2013-03-24 NOTE — Telephone Encounter (Signed)
Pt needs refill of: amphetamine-dextroamphetamine (ADDERALL) 10 MG tablet

## 2013-03-26 NOTE — Telephone Encounter (Signed)
It appears she got #30 on 01-01-13. Ok to refill.

## 2013-03-26 NOTE — Telephone Encounter (Signed)
Pt said she is out and of meds. Pt said this was filled on the 23rd of the month. (Although script states the 28th).

## 2013-03-30 MED ORDER — AMPHETAMINE-DEXTROAMPHETAMINE 10 MG PO TABS: 10.0000 mg | ORAL_TABLET | Freq: Every day | ORAL | Status: DC

## 2013-03-30 NOTE — Telephone Encounter (Signed)
Pt informed on VM ready to pick-up

## 2013-04-03 ENCOUNTER — Other Ambulatory Visit: Payer: Self-pay | Admitting: Family Medicine

## 2013-04-07 ENCOUNTER — Other Ambulatory Visit: Payer: Self-pay | Admitting: Family Medicine

## 2013-05-11 ENCOUNTER — Telehealth: Payer: Self-pay | Admitting: Family Medicine

## 2013-05-11 NOTE — Telephone Encounter (Signed)
Pt requesting 3 new rx for the Adderall 20mg . She would like to pick up on Friday if possible. She will be out of town starting Saturday.  Please call when ready for pick up.

## 2013-05-12 NOTE — Telephone Encounter (Signed)
Refill OK

## 2013-05-13 MED ORDER — AMPHETAMINE-DEXTROAMPHET ER 20 MG PO CP24: 20.0000 mg | ORAL_CAPSULE | ORAL | Status: DC

## 2013-05-13 NOTE — Telephone Encounter (Signed)
RX is printed off and will be in the front for pick up. Left message informing patient.

## 2013-06-01 ENCOUNTER — Encounter (HOSPITAL_COMMUNITY): Payer: Self-pay | Admitting: Emergency Medicine

## 2013-06-01 ENCOUNTER — Emergency Department (HOSPITAL_COMMUNITY)
Admission: EM | Admit: 2013-06-01 | Discharge: 2013-06-02 | Disposition: A | Discharge status: Home / Self Care | Payer: BC Managed Care – PPO | Attending: Emergency Medicine | Admitting: Emergency Medicine

## 2013-06-01 DIAGNOSIS — J45901 Unspecified asthma with (acute) exacerbation: Secondary | ICD-10-CM | POA: Insufficient documentation

## 2013-06-01 DIAGNOSIS — Z8619 Personal history of other infectious and parasitic diseases: Secondary | ICD-10-CM | POA: Insufficient documentation

## 2013-06-01 DIAGNOSIS — I10 Essential (primary) hypertension: Secondary | ICD-10-CM | POA: Insufficient documentation

## 2013-06-01 DIAGNOSIS — F329 Major depressive disorder, single episode, unspecified: Secondary | ICD-10-CM | POA: Insufficient documentation

## 2013-06-01 DIAGNOSIS — Z8742 Personal history of other diseases of the female genital tract: Secondary | ICD-10-CM | POA: Insufficient documentation

## 2013-06-01 DIAGNOSIS — Z87440 Personal history of urinary (tract) infections: Secondary | ICD-10-CM | POA: Insufficient documentation

## 2013-06-01 DIAGNOSIS — J9801 Acute bronchospasm: Secondary | ICD-10-CM

## 2013-06-01 DIAGNOSIS — K219 Gastro-esophageal reflux disease without esophagitis: Secondary | ICD-10-CM | POA: Insufficient documentation

## 2013-06-01 DIAGNOSIS — Z79899 Other long term (current) drug therapy: Secondary | ICD-10-CM | POA: Insufficient documentation

## 2013-06-01 DIAGNOSIS — F3289 Other specified depressive episodes: Secondary | ICD-10-CM | POA: Insufficient documentation

## 2013-06-01 DIAGNOSIS — Z8679 Personal history of other diseases of the circulatory system: Secondary | ICD-10-CM | POA: Insufficient documentation

## 2013-06-01 DIAGNOSIS — R1110 Vomiting, unspecified: Secondary | ICD-10-CM | POA: Insufficient documentation

## 2013-06-01 MED ORDER — ALBUTEROL SULFATE (5 MG/ML) 0.5% IN NEBU
5.0000 mg | INHALATION_SOLUTION | Freq: Once | RESPIRATORY_TRACT | Status: AC
  Administered 2013-06-01: 5 mg via RESPIRATORY_TRACT
  Filled 2013-06-01: qty 1

## 2013-06-01 NOTE — ED Notes (Signed)
PT. REPORTS ASTHMA ATTACK WITH OCCASIONAL COUGH THIS EVENING , DENIES FEVER OR CHILLS.

## 2013-06-02 ENCOUNTER — Encounter: Payer: Self-pay | Admitting: Gastroenterology

## 2013-06-02 ENCOUNTER — Other Ambulatory Visit: Payer: Self-pay | Admitting: Family Medicine

## 2013-06-02 MED ORDER — PREDNISONE 10 MG PO TABS: 60.0000 mg | ORAL_TABLET | Freq: Every day | ORAL | Status: DC

## 2013-06-02 MED ORDER — PREDNISONE 20 MG PO TABS
60.0000 mg | ORAL_TABLET | Freq: Once | ORAL | Status: AC
  Administered 2013-06-02: 60 mg via ORAL
  Filled 2013-06-02: qty 3

## 2013-06-02 MED ORDER — ALBUTEROL SULFATE (5 MG/ML) 0.5% IN NEBU
2.5000 mg | INHALATION_SOLUTION | Freq: Once | RESPIRATORY_TRACT | Status: AC
  Administered 2013-06-02: 2.5 mg via RESPIRATORY_TRACT
  Filled 2013-06-02: qty 0.5

## 2013-06-02 MED ORDER — IPRATROPIUM BROMIDE 0.02 % IN SOLN
0.5000 mg | Freq: Once | RESPIRATORY_TRACT | Status: AC
  Administered 2013-06-02: 0.5 mg via RESPIRATORY_TRACT
  Filled 2013-06-02: qty 2.5

## 2013-06-02 MED ORDER — ALBUMIN HUMAN 5 % IV SOLN: 12.5000 g | Freq: Once | INTRAVENOUS | Status: DC

## 2013-06-02 NOTE — ED Provider Notes (Signed)
CSN: [id number]     Arrival date & time 06/01/13  2323 History     First MD Initiated Contact with Patient 06/02/13 0022     Chief Complaint  Patient presents with  . Asthma   (Consider location/radiation/quality/duration/timing/severity/associated sxs/prior Treatment) The history is provided by the patient.   patient reports long-standing history of GERD-like symptoms where she gets a sense of acid in her mouth makes her vomit.  This occurred this evening and she states is exacerbated her asthma.  She has a long-standing history of asthma as well and stridor breathing treatments at home without improvement in her symptoms.  No chest pain or fevers or chills.  No longer with any nausea or vomiting.  She denies abdominal pain.  She's never followup with her gastroenterologist regarding her GERD-like symptoms.  She's been taking omeprazole without improvement in her symptoms.  She states this evening she ate pizza and that acid sensation came into her chest and into her mouth.  By the time I evaluated the patient the patient has received albuterol by nursing staff and she states she feels much better at this time.  She would like her to home at this time.  Past Medical History  Diagnosis Date  . Depression   . Hypertension   . Asthma   . GERD (gastroesophageal reflux disease)   . Hepatitis A   . Jaundice   . Blood transfusion   . Migraines   . UTI (urinary tract infection)   . Ovarian cyst    Past Surgical History  Procedure Laterality Date  . Appendectomy  1994  . Ovarian cyst removal    . Tonsillectomy and adenoidectomy     Family History  Problem Relation Age of Onset  . Asthma Son   . Hypertension Other    History  Substance Use Topics  . Smoking status: Never Smoker   . Smokeless tobacco: Never Used  . Alcohol Use: No   OB History   Grav Para Term Preterm Abortions TAB SAB Ect Mult Living                 Review of Systems  All other systems reviewed and are  negative.    Allergies  Aspirin; Codeine sulfate; and Hydrocodone-acetaminophen  Home Medications   Current Outpatient Rx  Name  Route  Sig  Dispense  Refill  . amphetamine-dextroamphetamine (ADDERALL XR) 20 MG 24 hr capsule   Oral   Take 1 capsule (20 mg total) by mouth every morning.   30 capsule   0   . budesonide-formoterol (SYMBICORT) 160-4.5 MCG/ACT inhaler   Inhalation   Inhale 2 puffs into the lungs 2 (two) times daily.   10.2 g   3   . olmesartan (BENICAR) 20 MG tablet   Oral   Take 1 tablet (20 mg total) by mouth daily.   30 tablet   3   . omeprazole (PRILOSEC) 20 MG capsule   Oral   Take 20 mg by mouth daily.           . sertraline (ZOLOFT) 50 MG tablet      take 1 tablet by mouth once daily   90 tablet   3   . VENTOLIN HFA 108 (90 BASE) MCG/ACT inhaler      INHALE BY MOUTH AS NEEDED AS DIRECTED   18 g   0   . predniSONE (DELTASONE) 10 MG tablet   Oral   Take 6 tablets (60 mg total) by  mouth daily.   30 tablet   0    BP 146/98  Pulse 100  Temp(Src) 98.5 F (36.9 C) (Oral)  Resp 18  SpO2 99%  LMP 05/25/2013 Physical Exam  Nursing note and vitals reviewed. Constitutional: She is oriented to person, place, and time. She appears well-developed and well-nourished. No distress.  HENT:  Head: Normocephalic and atraumatic.  Eyes: EOM are normal.  Neck: Normal range of motion.  Cardiovascular: Normal rate, regular rhythm and normal heart sounds.   Pulmonary/Chest: Effort normal and breath sounds normal. No respiratory distress. She has no wheezes.  Abdominal: Soft. She exhibits no distension. There is no tenderness.  Musculoskeletal: Normal range of motion.  Neurological: She is alert and oriented to person, place, and time.  Skin: Skin is warm and dry.  Psychiatric: She has a normal mood and affect. Judgment normal.    ED Course   Procedures (including critical care time)  Labs Reviewed - No data to display No results found. 1.  Bronchospasm, acute   2. GERD     MDM  This is likely acute bronchospasm associated with vomiting.  She feels much better at this time.  She is requesting a dose of steroids a prescription for steroids to go home with in case her breathing worsens.  She has albuterol at home.  I think was probably more concerning is her severe GERD-like symptoms that don't seem to be improving with omeprazole.  I think she'll need gastroenterology followup and I've given her referral numbers for this.  It is important that she followup with GI and she may benefit either from endoscopy or upper GI series.  Abdomen benign on exam.  A biliary colic.  Discharge home in good condition.  Lyanne Co, MD 06/02/13 [phone]

## 2013-06-07 ENCOUNTER — Emergency Department (HOSPITAL_COMMUNITY)
Admission: EM | Admit: 2013-06-07 | Discharge: 2013-06-07 | Disposition: A | Discharge status: Home / Self Care | Payer: BC Managed Care – PPO | Attending: Emergency Medicine | Admitting: Emergency Medicine

## 2013-06-07 ENCOUNTER — Encounter (HOSPITAL_COMMUNITY): Payer: Self-pay | Admitting: *Deleted

## 2013-06-07 DIAGNOSIS — F3289 Other specified depressive episodes: Secondary | ICD-10-CM | POA: Insufficient documentation

## 2013-06-07 DIAGNOSIS — I10 Essential (primary) hypertension: Secondary | ICD-10-CM | POA: Insufficient documentation

## 2013-06-07 DIAGNOSIS — B159 Hepatitis A without hepatic coma: Secondary | ICD-10-CM | POA: Insufficient documentation

## 2013-06-07 DIAGNOSIS — K219 Gastro-esophageal reflux disease without esophagitis: Secondary | ICD-10-CM | POA: Insufficient documentation

## 2013-06-07 DIAGNOSIS — J45909 Unspecified asthma, uncomplicated: Secondary | ICD-10-CM | POA: Insufficient documentation

## 2013-06-07 DIAGNOSIS — Z8742 Personal history of other diseases of the female genital tract: Secondary | ICD-10-CM | POA: Insufficient documentation

## 2013-06-07 DIAGNOSIS — F329 Major depressive disorder, single episode, unspecified: Secondary | ICD-10-CM | POA: Insufficient documentation

## 2013-06-07 DIAGNOSIS — Z79899 Other long term (current) drug therapy: Secondary | ICD-10-CM | POA: Insufficient documentation

## 2013-06-07 DIAGNOSIS — J029 Acute pharyngitis, unspecified: Secondary | ICD-10-CM | POA: Insufficient documentation

## 2013-06-07 DIAGNOSIS — Z87440 Personal history of urinary (tract) infections: Secondary | ICD-10-CM | POA: Insufficient documentation

## 2013-06-07 DIAGNOSIS — Z8669 Personal history of other diseases of the nervous system and sense organs: Secondary | ICD-10-CM | POA: Insufficient documentation

## 2013-06-07 DIAGNOSIS — Z5189 Encounter for other specified aftercare: Secondary | ICD-10-CM | POA: Insufficient documentation

## 2013-06-07 DIAGNOSIS — Z888 Allergy status to other drugs, medicaments and biological substances status: Secondary | ICD-10-CM | POA: Insufficient documentation

## 2013-06-07 DIAGNOSIS — J45901 Unspecified asthma with (acute) exacerbation: Secondary | ICD-10-CM

## 2013-06-07 DIAGNOSIS — Z885 Allergy status to narcotic agent status: Secondary | ICD-10-CM | POA: Insufficient documentation

## 2013-06-07 DIAGNOSIS — R05 Cough: Secondary | ICD-10-CM | POA: Insufficient documentation

## 2013-06-07 DIAGNOSIS — R059 Cough, unspecified: Secondary | ICD-10-CM | POA: Insufficient documentation

## 2013-06-07 MED ORDER — PREDNISONE 20 MG PO TABS
60.0000 mg | ORAL_TABLET | Freq: Once | ORAL | Status: AC
  Administered 2013-06-07: 60 mg via ORAL
  Filled 2013-06-07: qty 3

## 2013-06-07 MED ORDER — ALBUTEROL SULFATE (5 MG/ML) 0.5% IN NEBU
2.5000 mg | INHALATION_SOLUTION | Freq: Once | RESPIRATORY_TRACT | Status: AC
  Administered 2013-06-07: 5 mg via RESPIRATORY_TRACT

## 2013-06-07 MED ORDER — ALBUTEROL SULFATE (5 MG/ML) 0.5% IN NEBU
INHALATION_SOLUTION | RESPIRATORY_TRACT | Status: AC
  Administered 2013-06-07: 5 mg via RESPIRATORY_TRACT
  Filled 2013-06-07: qty 1

## 2013-06-07 MED ORDER — IPRATROPIUM BROMIDE 0.02 % IN SOLN: 0.5000 mg | Freq: Once | RESPIRATORY_TRACT | Status: AC

## 2013-06-07 MED ORDER — IPRATROPIUM BROMIDE 0.02 % IN SOLN
RESPIRATORY_TRACT | Status: AC
  Administered 2013-06-07: 0.5 mg via RESPIRATORY_TRACT
  Filled 2013-06-07: qty 2.5

## 2013-06-07 NOTE — Discharge Instructions (Signed)
Asthma Attack Prevention HOW CAN ASTHMA BE PREVENTED? Currently, there is no way to prevent asthma from starting. However, you can take steps to control the disease and prevent its symptoms after you have been diagnosed. Learn about your asthma and how to control it. Take an active role to control your asthma by working with your caregiver to create and follow an asthma action plan. An asthma action plan guides you in taking your medicines properly, avoiding factors that make your asthma worse, tracking your level of asthma control, responding to worsening asthma, and seeking emergency care when needed. To track your asthma, keep records of your symptoms, check your peak flow number using a peak flow meter (handheld device that shows how well air moves out of your lungs), and get regular asthma checkups.  Other ways to prevent asthma attacks include:  Use medicines as your caregiver directs.  Identify and avoid things that make your asthma worse (as much as you can).  Keep track of your asthma symptoms and level of control.  Get regular checkups for your asthma.  With your caregiver, write a detailed plan for taking medicines and managing an asthma attack. Then be sure to follow your action plan. Asthma is an ongoing condition that needs regular monitoring and treatment.  Identify and avoid asthma triggers. A number of outdoor allergens and irritants (pollen, mold, cold air, air pollution) can trigger asthma attacks. Find out what causes or makes your asthma worse, and take steps to avoid those triggers.  Monitor your breathing. Learn to recognize warning signs of an attack, such as slight coughing, wheezing or shortness of breath. However, your lung function may already decrease before you notice any signs or symptoms, so regularly measure and record your peak airflow with a home peak flow meter.  Identify and treat attacks early. If you act quickly, you're less likely to have a severe attack.  You will also need less medicine to control your symptoms. When your peak flow measurements decrease and alert you to an upcoming attack, take your medicine as instructed, and immediately stop any activity that may have triggered the attack. If your symptoms do not improve, get medical help.  Pay attention to increasing quick-relief inhaler use. If you find yourself relying on your quick-relief inhaler (such as albuterol), your asthma is not under control. See your caregiver about adjusting your treatment. IDENTIFY AND CONTROL FACTORS THAT MAKE YOUR ASTHMA WORSE A number of common things can set off or make your asthma symptoms worse (asthma triggers). Keep track of your asthma symptoms for several weeks, detailing all the environmental and emotional factors that are linked with your asthma. When you have an asthma attack, go back to your asthma diary to see which factor, or combination of factors, might have contributed to it. Once you know what these factors are, you can take steps to control many of them.  Allergies: If you have allergies and asthma, it is important to take asthma prevention steps at home. Asthma attacks (worsening of asthma symptoms) can be triggered by allergies, which can cause temporary increased inflammation of your airways. Minimizing contact with the substance to which you are allergic will help prevent an asthma attack. Animal Dander:   Some people are allergic to the flakes of skin or dried saliva from animals with fur or feathers. Keep these pets out of your home.  If you can't keep a pet outdoors, keep the pet out of your bedroom and other sleeping areas at all times,   and keep the door closed.  Remove carpets and furniture covered with cloth from your home. If that is not possible, keep the pet away from fabric-covered furniture and carpets. Dust Mites:  Many people with asthma are allergic to dust mites. Dust mites are tiny bugs that are found in every home, in  mattresses, pillows, carpets, fabric-covered furniture, bedcovers, clothes, stuffed toys, fabric, and other fabric-covered items.  Cover your mattress in a special dust-proof cover.  Cover your pillow in a special dust-proof cover, or wash the pillow each week in hot water. Water must be hotter than 130 F to kill dust mites. Cold or warm water used with detergent and bleach can also be effective.  Wash the sheets and blankets on your bed each week in hot water.  Try not to sleep or lie on cloth-covered cushions.  Call ahead when traveling and ask for a smoke-free hotel room. Bring your own bedding and pillows, in case the hotel only supplies feather pillows and down comforters, which may contain dust mites and cause asthma symptoms.  Remove carpets from your bedroom and those laid on concrete, if you can.  Keep stuffed toys out of the bed, or wash the toys weekly in hot water or cooler water with detergent and bleach. Cockroaches:  Many people with asthma are allergic to the droppings and remains of cockroaches.  Keep food and garbage in closed containers. Never leave food out.  Use poison baits, traps, powders, gels, or paste (for example, boric acid).  If a spray is used to kill cockroaches, stay out of the room until the odor goes away. Indoor Mold:  Fix leaky faucets, pipes, or other sources of water that have mold around them.  Clean floors and moldy surfaces with a fungicide or diluted bleach.  Avoid using humidifiers, vaporizers, or swamp coolers. These can spread molds through the air. Pollen and Outdoor Mold:  When pollen or mold spore counts are high, try to keep your windows closed.  Stay indoors with windows closed from late morning to afternoon, if you can. Pollen and some mold spore counts are highest at that time.  Ask your caregiver whether you need to take or increase anti-inflammatory medicine before your allergy season starts. Irritants:   Tobacco smoke is  an irritant. If you smoke, ask your caregiver how you can quit. Ask family members to quit smoking, too. Do not allow smoking in your home or car.  If possible, do not use a wood-burning stove, kerosene heater, or fireplace. Minimize exposure to all sources of smoke, including incense, candles, fires, and fireworks.  Try to stay away from strong odors and sprays, such as perfume, talcum powder, hair spray, and paints.  Decrease humidity in your home and use an indoor air cleaning device. Reduce indoor humidity to below 60 percent. Dehumidifiers or central air conditioners can do this.  Decrease house dust exposure by changing furnace and air cooler filters frequently.  Try to have someone else vacuum for you once or twice a week, if you can. Stay out of rooms while they are being vacuumed and for a short while afterward.  If you vacuum, use a dust mask from a hardware store, a double-layered or microfilter vacuum cleaner bag, or a vacuum cleaner with a HEPA filter.  Sulfites in foods and beverages can be irritants. Do not drink beer or wine, or eat dried fruit, processed potatoes, or shrimp if they cause asthma symptoms.  Cold air can trigger an asthma attack.   Cover your nose and mouth with a scarf on cold or windy days.  Several health conditions can make asthma more difficult to manage, including runny nose, sinus infections, reflux disease, psychological stress, and sleep apnea. Your caregiver will treat these conditions, as well.  Avoid close contact with people who have a cold or the flu, since your asthma symptoms may get worse if you catch the infection from them. Wash your hands thoroughly after touching items that may have been handled by people with a respiratory infection.  Get a flu shot every year to protect against the flu virus, which often makes asthma worse for days or weeks. Also get a pneumonia shot once every 5 10 years. Medicines:  Aspirin and other pain relievers can  cause asthma attacks. Ten percent to 20% of people with asthma have sensitivity to aspirin or a group of pain relievers called non-steroidal anti-inflammatory medicines (NSAIDS), such as ibuprofen and naproxen. These medicines are used to treat pain and reduce fevers. Asthma attacks caused by any of these medicines can be severe and even fatal. These medicines must be avoided in people who have known aspirin sensitive asthma. Products with acetaminophen are considered safe for people who have asthma. It is important that people with aspirin sensitivity read labels of all over-the-counter medicines used to treat pain, colds, coughs, and fever.  Beta blockers and ACE inhibitors are other medicines which you should discuss with your caregiver, in relation to your asthma. ALLERGY SKIN TESTING  Ask your asthma caregiver about allergy skin testing or blood testing (RAST test) to identify the allergens to which you are sensitive. If you are found to have allergies, allergy shots (immunotherapy) for asthma may help prevent future allergies and asthma. With allergy shots, small doses of allergens (substances to which you are allergic) are injected under your skin on a regular schedule. Over a period of time, your body may become used to the allergen and less responsive with asthma symptoms. You can also take measures to minimize your exposure to those allergens. EXERCISE  If you have exercise-induced asthma, or are planning vigorous exercise, or exercise in cold, humid, or dry environments, prevent exercise-induced asthma by following your caregiver's advice regarding asthma treatment before exercising. Document Released: 10/09/2009 Document Revised: 10/07/2012 Document Reviewed: 10/09/2009 [employer] Patient Information 2014 [employer]. Asthma, Acute Bronchospasm Your exam shows you have asthma, or acute bronchospasm that acts like asthma. Bronchospasm means your air passages become narrowed. These conditions  are due to inflammation and airway spasm that cause narrowing of the bronchial tubes in the lungs. This causes you to have wheezing and shortness of breath. POSSIBLE TRIGGERS  Animal dander from the skin, hair, or feathers of animals.  Dust mites contained in house dust.  Cockroaches.  Pollen from trees or grass.  Mold.  Cigarette or tobacco smoke.  Air pollutants such as dust, household cleaners, hair sprays, aerosol sprays, paint fumes, strong chemicals, or strong odors.  Cold air or weather changes. Cold air may cause inflammation. Winds increase molds and pollens in the air.  Strong emotions such as crying or laughing hard.  Stress.  Certain medicines such as aspirin or beta-blockers.  Sulfites in such foods and drinks as dried fruits and wine.  Infections or inflammatory conditions such as a flu, cold, or inflammation of the nasal membranes (rhinitis).  Gastroesophageal reflux disease (GERD). GERD is a condition where stomach acid backs up into your throat (esophagus).  Exercise or strenous activity. TREATMENT  Treatment is aimed   at making the narrowed airways larger. Mild asthma or bronchospasm is usually controlled with inhaled medicines. Albuterol is a common medicine that you breathe in to open spastic or narrowed airways. Steroid medicine is also used to reduce the inflammation when an attack is moderate or severe. Antibiotics are only used if a bacterial infection is present.  HOME CARE INSTRUCTIONS   Rest.  Drink plenty of liquids. This helps the mucus to remain thin and easily coughed up. Do not use caffeine or alcohol.  Do not smoke. Avoid being exposed to secondhand smoke.  You play a critical role in keeping yourself in good health. Avoid exposure to things that cause you to wheeze. Avoid exposure to things that cause you to have breathing problems. Keep your medicines up-to-date and available. Carefully follow your caregiver's treatment plan.  When pollen  or pollution is bad, keep windows closed and use an air conditioner or go to places with air conditioning.  Take your medicine exactly as prescribed.  Asthma requires careful medical attention. See your caregiver for follow-up as advised. If you are more than [redacted] weeks pregnant and you were prescribed any new medicines, let your obstetrician know about the visit and how you are doing. Arrange a recheck. SEEK IMMEDIATE MEDICAL CARE IF:   You are getting worse.  You have trouble breathing. If severe, call your local emergency services 911 in [place]  You develop chest pain or discomfort.  You are throwing up or not drinking fluids.  You are coughing up yellow, green, brown, or bloody sputum.  You have a fever or persistent symptoms for more than 2 3 days.  You have a fever and your symptoms suddenly get worse.  You have trouble swallowing. MAKE SURE YOU:   Understand these instructions.  Will watch your condition.  Will get help right away if you are not doing well or get worse. Document Released: 02/05/2007 Document Revised: 10/07/2012 Document Reviewed: 10/05/2007 [employer] Patient Information 2014 [employer].  

## 2013-06-07 NOTE — ED Notes (Signed)
Patient states she woke up this am with feeling of sob, patient took albuterol inhaler at home with no improvement, patient seen here for same x 1 week ago

## 2013-06-07 NOTE — ED Provider Notes (Signed)
CSN: [id number]     Arrival date & time 06/07/13  1308 History     First MD Initiated Contact with Patient 06/07/13 0740     Chief Complaint  Patient presents with  . Shortness of Breath   (Consider location/radiation/quality/duration/timing/severity/associated sxs/prior Treatment) HPI Comments: Pt with h/o asthma, prone to several mild exacerbations, no hospitalizations in past year however.  Seen 6 days ago, related to GERD, has been referred to [facility].  Is taknig prilosec.  Pt did not fill steroid Rx because she thought she was doing better.  She states she will fill it today.    Patient is a 47 y.o. female presenting with shortness of breath. The history is provided by the patient and medical records.  Shortness of Breath Severity:  Moderate Onset quality:  Sudden Duration:  1 hour Timing:  Constant Progression:  Unchanged Chronicity:  Chronic Context: emotional upset   Worsened by:  Stress and emotional stress Ineffective treatments:  Inhaler Associated symptoms: cough and sore throat   Associated symptoms: no chest pain, no fever, no sputum production and no vomiting   Cough:    Cough characteristics:  Non-productive   Severity:  Mild   Onset quality:  Sudden   Duration:  1 hour   Timing:  Intermittent Sore throat:    Severity:  Mild   Duration:  1 hour   Past Medical History  Diagnosis Date  . Depression   . Hypertension   . Asthma   . GERD (gastroesophageal reflux disease)   . Hepatitis A   . Jaundice   . Blood transfusion   . Migraines   . UTI (urinary tract infection)   . Ovarian cyst    Past Surgical History  Procedure Laterality Date  . Appendectomy  1994  . Ovarian cyst removal    . Tonsillectomy and adenoidectomy     Family History  Problem Relation Age of Onset  . Asthma Son   . Hypertension Other    History  Substance Use Topics  . Smoking status: Never Smoker   . Smokeless tobacco: Never Used  . Alcohol Use: No   OB History   Grav Para Term Preterm Abortions TAB SAB Ect Mult Living                 Review of Systems  Constitutional: Negative for fever and chills.  HENT: Positive for sore throat.   Respiratory: Positive for cough and shortness of breath. Negative for sputum production.   Cardiovascular: Negative for chest pain.  Gastrointestinal: Negative for nausea and vomiting.  All other systems reviewed and are negative.    Allergies  Aspirin; Codeine sulfate; and Hydrocodone-acetaminophen  Home Medications   Current Outpatient Rx  Name  Route  Sig  Dispense  Refill  . albuterol (PROVENTIL HFA;VENTOLIN HFA) 108 (90 BASE) MCG/ACT inhaler   Inhalation   Inhale 2 puffs into the lungs every 6 (six) hours as needed for wheezing.         Marland Kitchen amphetamine-dextroamphetamine (ADDERALL XR) 20 MG 24 hr capsule   Oral   Take 1 capsule (20 mg total) by mouth every morning.   30 capsule   0   . budesonide-formoterol (SYMBICORT) 160-4.5 MCG/ACT inhaler   Inhalation   Inhale 2 puffs into the lungs 2 (two) times daily.   10.2 g   3   . olmesartan (BENICAR) 20 MG tablet   Oral   Take 1 tablet (20 mg total) by mouth daily.  30 tablet   3   . omeprazole (PRILOSEC) 20 MG capsule   Oral   Take 20 mg by mouth daily.          . predniSONE (DELTASONE) 10 MG tablet   Oral   Take 6 tablets (60 mg total) by mouth daily.   30 tablet   0   . sertraline (ZOLOFT) 50 MG tablet   Oral   Take 50 mg by mouth daily.          BP 155/90  Temp(Src) 98.1 F (36.7 C) (Oral)  Resp 22  Ht 5\' 6"  (1.676 m)  Wt 147 lb (66.679 kg)  BMI 23.74 kg/m2  SpO2 100%  LMP 05/25/2013 Physical Exam  Nursing note and vitals reviewed. Constitutional: She is oriented to person, place, and time. She appears well-developed and well-nourished.  HENT:  Head: Normocephalic and atraumatic.  Mouth/Throat: Uvula is midline, oropharynx is clear and moist and mucous membranes are normal.  Eyes: EOM are normal.  Neck: Normal range  of motion. Neck supple.  Cardiovascular: Regular rhythm.   Pulmonary/Chest: Effort normal. No stridor. No respiratory distress. She has wheezes. She has no rales. She exhibits no tenderness.  Abdominal: Soft. There is no tenderness. There is no rebound.  Neurological: She is alert and oriented to person, place, and time.  Skin: Skin is warm and dry.    ED Course   Procedures (including critical care time)  Labs Reviewed - No data to display No results found. 1. Asthma attack    ra sat is 100% and I interpret to be normal  MDM  Pt after 1 neb in the ED is moving very good air, mild expiratory wheezing, symmetric.  Pt desires to go home because she left her sone asleep at home and she is concerned he will wake up and be frightened that she is gone.  Pt will fill Rx that she received last week.  She knows to return if worse in any way.  Pt feels less panicky and improved.    [name]. [name] Lamas, MD 06/07/13 [phone]

## 2013-06-11 ENCOUNTER — Other Ambulatory Visit: Payer: Self-pay | Admitting: Family Medicine

## 2013-06-16 ENCOUNTER — Telehealth: Payer: Self-pay | Admitting: Family Medicine

## 2013-06-16 NOTE — Telephone Encounter (Signed)
Refill OK

## 2013-06-16 NOTE — Telephone Encounter (Signed)
PT is calling to request a refill of her amphetamine-dextroamphetamine (ADDERALL XR) 20 MG 24 hr capsule. She would like to pick this up Friday, if at all possible. Please assist.

## 2013-06-16 NOTE — Telephone Encounter (Signed)
Last visit 09/2012 Last refill 05/13/13 #30 no refill

## 2013-06-17 MED ORDER — AMPHETAMINE-DEXTROAMPHET ER 20 MG PO CP24: 20.0000 mg | ORAL_CAPSULE | ORAL | Status: DC

## 2013-06-17 NOTE — Telephone Encounter (Signed)
Rx will be at the front for pickup

## 2013-06-25 ENCOUNTER — Telehealth: Payer: Self-pay | Admitting: Family Medicine

## 2013-06-25 MED ORDER — AMPHETAMINE-DEXTROAMPHETAMINE 10 MG PO TABS: 10.0000 mg | ORAL_TABLET | Freq: Every day | ORAL | Status: DC

## 2013-06-25 NOTE — Telephone Encounter (Signed)
Refill once but will need office f/u after that.

## 2013-06-25 NOTE — Telephone Encounter (Signed)
Pt was last seen 09/11/12 Pt wants to get refills on her Adderall 10mg , pt just #30 no refills on her Adderrall  20mg , pt stated that she takes the 20 mg in the morning and the 10 mg at night.

## 2013-06-25 NOTE — Telephone Encounter (Signed)
Left detailed message on patient VM

## 2013-06-25 NOTE — Telephone Encounter (Signed)
PT is requesting a 3 month refill of her amphetamine-dextroamphetamine (ADDERALL) 10 MG tablet. Please assist.

## 2013-06-30 ENCOUNTER — Encounter: Payer: Self-pay | Admitting: Gastroenterology

## 2013-06-30 ENCOUNTER — Ambulatory Visit (INDEPENDENT_AMBULATORY_CARE_PROVIDER_SITE_OTHER): Payer: BC Managed Care – PPO | Admitting: Gastroenterology

## 2013-06-30 VITALS — BP 116/80 | HR 68 | Ht 66.0 in | Wt 188.6 lb

## 2013-06-30 DIAGNOSIS — K219 Gastro-esophageal reflux disease without esophagitis: Secondary | ICD-10-CM

## 2013-06-30 DIAGNOSIS — E669 Obesity, unspecified: Secondary | ICD-10-CM

## 2013-06-30 NOTE — Progress Notes (Signed)
HPI: This is a   very pleasant 48 year old woman whom I am meeting for the first time today.  Pyrosis  Vomiting at night, water brash has led to asthma flare ups.  Eats a lot of sweets.  Eats late dinners, snacks before sleeping.  Hungry.  Drinks 40oz of coffee daily, sometimes in afternoon.  Energy drinks periodically.  Non smoker.  No etoh.  No peppermint.  Eats chocolate daily.  Has gained 30 pounds in past 2-3 years.  Takes omeprazole, throughout the day, once daily (OTC and prescription strength).  Without it, she will have serious acid issues.  No dysphagia.  Burning in upper esophagus often.  Uncle with esophagus cancer, died recently.   Review of systems: Pertinent positive and negative review of systems were noted in the above HPI section. Complete review of systems was performed and was otherwise normal.    Past Medical History  Diagnosis Date  . Depression   . Hypertension   . Asthma   . GERD (gastroesophageal reflux disease)   . Hepatitis A   . Jaundice   . Blood transfusion   . Migraines   . UTI (urinary tract infection)   . Ovarian cyst     Past Surgical History  Procedure Laterality Date  . Appendectomy  1994  . Ovarian cyst removal    . Tonsillectomy and adenoidectomy      Current Outpatient Prescriptions  Medication Sig Dispense Refill  . albuterol (PROVENTIL HFA;VENTOLIN HFA) 108 (90 BASE) MCG/ACT inhaler Inhale 2 puffs into the lungs every 6 (six) hours as needed for wheezing.      Marland Kitchen amphetamine-dextroamphetamine (ADDERALL XR) 20 MG 24 hr capsule Take 1 capsule (20 mg total) by mouth every morning.  30 capsule  0  . amphetamine-dextroamphetamine (ADDERALL) 10 MG tablet Take 1 tablet (10 mg total) by mouth daily.  30 tablet  0  . olmesartan (BENICAR) 20 MG tablet Take 1 tablet (20 mg total) by mouth daily.  30 tablet  3  . omeprazole (PRILOSEC) 20 MG capsule Take 20 mg by mouth daily.       . sertraline (ZOLOFT) 50 MG tablet Take 50 mg by mouth  daily.      . SYMBICORT 160-4.5 MCG/ACT inhaler inhale 2 puffs INTO THE LUNGS twice a day  10.2 g  3   No current facility-administered medications for this visit.    Allergies as of 06/30/2013 - Review Complete 06/30/2013  Allergen Reaction Noted  . Aspirin Shortness Of Breath 05/25/2009  . Codeine sulfate Hives and Nausea And Vomiting 05/25/2009  . Hydrocodone-acetaminophen Itching and Rash 05/25/2009    Family History  Problem Relation Age of Onset  . Asthma Son   . Hypertension Other   . Crohn's disease Father   . Crohn's disease Maternal Aunt   . Esophageal cancer Maternal Uncle   . Breast cancer Maternal Grandmother     History   Social History  . Marital Status: Divorced    Spouse Name: N/A    Number of Children: 2  . Years of Education: N/A   Occupational History  . Building surveyor Rep for [employer]   .  [employer]   Social History Main Topics  . Smoking status: Never Smoker   . Smokeless tobacco: Never Used  . Alcohol Use: No  . Drug Use: No  . Sexual Activity: Not on file   Other Topics Concern  . Not on file   Social History Narrative  .  No narrative on file       Physical Exam: BP 116/80  Pulse 68  Ht 5\' 6"  (1.676 m)  Wt 188 lb 9.6 oz (85.548 kg)  BMI 30.46 kg/m2  LMP 06/30/2013 Constitutional: generally well-appearing Psychiatric: alert and oriented x3 Eyes: extraocular movements intact Mouth: oral pharynx moist, no lesions Neck: supple no lymphadenopathy Cardiovascular: heart regular rate and rhythm Lungs: clear to auscultation bilaterally Abdomen: soft, nontender, nondistended, no obvious ascites, no peritoneal signs, normal bowel sounds Extremities: no lower extremity edema bilaterally Skin: no lesions on visible extremities    Assessment and plan: 47 y.o. female with  chronic GERD without any alarm symptoms  She has a lot of lifestyle modifications to work on including gradual weight loss, not eating within 2-3  hours of laying down for bed, cutting back on caffeinated beverages especially in the afternoon. She is going to change the way she is taking her proton pump inhibitor so that it is shortly before a meal as that is the way it is designed to work most effectively. She is going to add H2 blocker at night at bedtime to help especially with her overnight water brash symptoms. She will call to report on her response to these modifications in 4-5 weeks. She does not have significant improvement in that time then I will like to proceed with EGD.

## 2013-06-30 NOTE — Patient Instructions (Addendum)
You should change the way you are taking your antiacid medicine (omeprazole) so that you are taking it 20-30 minutes prior to a decent meal as that is the way the pill is designed to work most effectively. Take an OTC zantac or pepcid at bedtime every night. Don't eat within 2-3 hours of laying down. Try not to drink caffeine after 3pm. Cut back on chocolate. Slow gradual weight loss. Referral to dietician to help with smart food choices. You will receive a call from the Nutritionist to set up appointment, if you do not here from that office in a week or so call [phone] and I will check on your appointment. If you are still bothered after adjusting your omeprazole, adding nightly pepcid then we should proceed with EGD.  Call Dr. Christella Hartigan in 4 weeks to report on your symptoms.                                              We are excited to introduce MyChart, a new best-in-class service that provides you online access to important information in your electronic medical record. We want to make it easier for you to view your health information - all in one secure location - when and where you need it. We expect MyChart will enhance the quality of care and service we provide.  When you register for MyChart, you can:    View your test results.    Request appointments and receive appointment reminders via email.    Request medication renewals.    View your medical history, allergies, medications and immunizations.    Communicate with your physician's office through a password-protected site.    Conveniently print information such as your medication lists.  To find out if MyChart is right for you, please talk to a member of our clinical staff today. We will gladly answer your questions about this free health and wellness tool.  If you are age 46 or older and want a member of your family to have access to your record, you must provide written consent by completing a proxy form available at our  office. Please speak to our clinical staff about guidelines regarding accounts for patients younger than age 71.  As you activate your MyChart account and need any technical assistance, please call the MyChart technical support line at [phone]) or email your question to [email or web address]. If you email your question(s), please include your name, a return phone number and the best time to reach you.  If you have non-urgent health-related questions, you can send a message to our office through MyChart at [email or web address]. If you have a medical emergency, call 911.  Thank you for using MyChart as your new health and wellness resource!   MyChart licensed from [employer],  1191-4782. Patents Pending.

## 2013-07-10 ENCOUNTER — Other Ambulatory Visit: Payer: Self-pay | Admitting: Family Medicine

## 2013-07-14 ENCOUNTER — Telehealth: Payer: Self-pay | Admitting: Family Medicine

## 2013-07-14 MED ORDER — AMPHETAMINE-DEXTROAMPHET ER 20 MG PO CP24: 20.0000 mg | ORAL_CAPSULE | ORAL | Status: DC

## 2013-07-14 NOTE — Telephone Encounter (Signed)
Last refill 06/17/13 #30 no refill Last visit 09/08/12 seen by Dr. Caryl Never Last visit 03/19/13 seen by Dr. Selena Batten

## 2013-07-14 NOTE — Telephone Encounter (Signed)
Refill for 3 months. 

## 2013-07-14 NOTE — Telephone Encounter (Signed)
Left message on VM rx will be at the front for pickup

## 2013-07-14 NOTE — Telephone Encounter (Signed)
PT calling to request a refill of her amphetamine-dextroamphetamine (ADDERALL XR) 20 MG 24 hr capsule. Please assist.

## 2013-07-20 ENCOUNTER — Ambulatory Visit (INDEPENDENT_AMBULATORY_CARE_PROVIDER_SITE_OTHER): Payer: BC Managed Care – PPO

## 2013-07-20 DIAGNOSIS — Z23 Encounter for immunization: Secondary | ICD-10-CM

## 2013-07-21 ENCOUNTER — Telehealth: Payer: Self-pay | Admitting: Family Medicine

## 2013-07-21 NOTE — Telephone Encounter (Signed)
Refill OK

## 2013-07-21 NOTE — Telephone Encounter (Signed)
Rx last filled 06/25/13.  Pt last seen by Dr. Selena Batten for acute need on 03/19/2013. Pt last seen by Dr. Caryl Never 09/08/2012

## 2013-07-21 NOTE — Telephone Encounter (Signed)
Pt requesting rx amphetamine-dextroamphetamine (ADDERALL) 10 MG tablet

## 2013-07-22 MED ORDER — AMPHETAMINE-DEXTROAMPHETAMINE 10 MG PO TABS: 10.0000 mg | ORAL_TABLET | Freq: Every day | ORAL | Status: DC

## 2013-07-22 NOTE — Telephone Encounter (Signed)
Rx at the front for pick up

## 2013-08-10 ENCOUNTER — Ambulatory Visit: Payer: BC Managed Care – PPO | Admitting: Gastroenterology

## 2013-08-19 ENCOUNTER — Telehealth: Payer: Self-pay | Admitting: Family Medicine

## 2013-08-19 DIAGNOSIS — I10 Essential (primary) hypertension: Secondary | ICD-10-CM

## 2013-08-19 NOTE — Telephone Encounter (Signed)
Pt is calling to request a 3 month supply of her amphetamine-dextroamphetamine (ADDERALL) 10 MG tablet. Please assist.

## 2013-08-20 MED ORDER — AMPHETAMINE-DEXTROAMPHETAMINE 10 MG PO TABS: 10.0000 mg | ORAL_TABLET | Freq: Every day | ORAL | Status: DC

## 2013-08-20 NOTE — Telephone Encounter (Signed)
Last refill 07/22/13 #30 0 refill Last visit 03/19/13 seen by Dr. Selena Batten and 09/08/12 seen by Dr. Caryl Never

## 2013-08-20 NOTE — Telephone Encounter (Signed)
Left message on patient Vm informing that RX will be ready for pickup

## 2013-08-20 NOTE — Telephone Encounter (Signed)
Refill OK

## 2013-09-15 ENCOUNTER — Encounter (HOSPITAL_COMMUNITY): Payer: Self-pay | Admitting: Emergency Medicine

## 2013-09-15 ENCOUNTER — Emergency Department (HOSPITAL_COMMUNITY)
Admission: EM | Admit: 2013-09-15 | Discharge: 2013-09-15 | Disposition: A | Discharge status: Home / Self Care | Payer: BC Managed Care – PPO | Source: Home / Self Care | Attending: Emergency Medicine | Admitting: Emergency Medicine

## 2013-09-15 DIAGNOSIS — B86 Scabies: Secondary | ICD-10-CM

## 2013-09-15 DIAGNOSIS — B852 Pediculosis, unspecified: Secondary | ICD-10-CM

## 2013-09-15 DIAGNOSIS — R21 Rash and other nonspecific skin eruption: Secondary | ICD-10-CM

## 2013-09-15 MED ORDER — BENZYL ALCOHOL 5 % EX LOTN: TOPICAL_LOTION | CUTANEOUS | Status: DC

## 2013-09-15 MED ORDER — PREDNISONE 10 MG PO KIT: PACK | ORAL | Status: DC

## 2013-09-15 MED ORDER — METHYLPREDNISOLONE SODIUM SUCC 125 MG IJ SOLR
INTRAMUSCULAR | Status: AC
  Filled 2013-09-15: qty 2

## 2013-09-15 MED ORDER — HYDROXYZINE HCL 25 MG PO TABS: 25.0000 mg | ORAL_TABLET | ORAL | Status: DC | PRN

## 2013-09-15 MED ORDER — PERMETHRIN 5 % EX CREA: TOPICAL_CREAM | CUTANEOUS | Status: DC

## 2013-09-15 MED ORDER — METHYLPREDNISOLONE SODIUM SUCC 125 MG IJ SOLR
125.0000 mg | Freq: Once | INTRAMUSCULAR | Status: AC
  Administered 2013-09-15: 125 mg via INTRAMUSCULAR

## 2013-09-15 MED ORDER — RANITIDINE HCL 150 MG PO CAPS: 150.0000 mg | ORAL_CAPSULE | Freq: Every day | ORAL | Status: DC

## 2013-09-15 NOTE — ED Notes (Signed)
Concerned she has scabies; has "samples" of the bugs that have been eating her ; multiple lesions on bilateral forearms ; anxious

## 2013-09-15 NOTE — ED Provider Notes (Signed)
CSN: [id number]     Arrival date & time 09/15/13  1751 History   First MD Initiated Contact with Patient 09/15/13 1821     Chief Complaint  Patient presents with  . Rash   (Consider location/radiation/quality/duration/timing/severity/associated sxs/prior Treatment) HPI Comments: 47 year old female presents complaining of scabies. For the last 2 weeks she has had severe itching of her back that is worse at night as well as itching of her scalp. She has been trying to treat this with over-the-counter hydrocortisone cream without success. She has also tried switching all of her laundry detergent soaps but the itching persists. This got a lot worse in the last couple of days and now she has developed pruritic papules all over her arms. The area is severely itchy and is worse at night. She combed her hair and believes she sees what looks like lice or scabies in her care. She has no known contacts with scabies. Her daughter at home has a similar rash that went away on its own. No symptoms apart from the rash. There is no fever, chills, NVD  Patient is a 47 y.o. female presenting with rash.  Rash Associated symptoms: no chest pain, no chills, no cough, no dysuria, no fever, no nausea, no shortness of breath and no vomiting     Past Medical History  Diagnosis Date  . Depression   . Hypertension   . Asthma   . GERD (gastroesophageal reflux disease)   . Hepatitis A   . Jaundice   . Blood transfusion   . Migraines   . UTI (urinary tract infection)   . Ovarian cyst    Past Surgical History  Procedure Laterality Date  . Appendectomy  1994  . Ovarian cyst removal    . Tonsillectomy and adenoidectomy     Family History  Problem Relation Age of Onset  . Asthma Son   . Hypertension Other   . Crohn's disease Father   . Crohn's disease Maternal Aunt   . Esophageal cancer Maternal Uncle   . Breast cancer Maternal Grandmother    History  Substance Use Topics  . Smoking status: Never Smoker    . Smokeless tobacco: Never Used  . Alcohol Use: No   OB History   Grav Para Term Preterm Abortions TAB SAB Ect Mult Living                 Review of Systems  Constitutional: Negative for fever and chills.  Eyes: Negative for visual disturbance.  Respiratory: Negative for cough and shortness of breath.   Cardiovascular: Negative for chest pain, palpitations and leg swelling.  Gastrointestinal: Negative for nausea, vomiting and abdominal pain.  Endocrine: Negative for polydipsia and polyuria.  Genitourinary: Negative for dysuria, urgency and frequency.  Musculoskeletal: Negative for arthralgias and myalgias.  Skin: Positive for rash.  Neurological: Negative for dizziness, weakness and light-headedness.    Allergies  Aspirin; Codeine sulfate; and Hydrocodone-acetaminophen  Home Medications   Current Outpatient Rx  Name  Route  Sig  Dispense  Refill  . amphetamine-dextroamphetamine (ADDERALL XR) 20 MG 24 hr capsule   Oral   Take 1 capsule (20 mg total) by mouth every morning.   30 capsule   0   . amphetamine-dextroamphetamine (ADDERALL XR) 20 MG 24 hr capsule   Oral   Take 1 capsule (20 mg total) by mouth every morning. May refill in one month   30 capsule   0   . amphetamine-dextroamphetamine (ADDERALL XR) 20 MG 24 hr  capsule   Oral   Take 1 capsule (20 mg total) by mouth every morning. May refill in two months   30 capsule   0   . amphetamine-dextroamphetamine (ADDERALL) 10 MG tablet   Oral   Take 1 tablet (10 mg total) by mouth daily. May refill in two months   30 tablet   0   . amphetamine-dextroamphetamine (ADDERALL) 10 MG tablet   Oral   Take 1 tablet (10 mg total) by mouth daily. May refill in one month   30 tablet   0   . amphetamine-dextroamphetamine (ADDERALL) 10 MG tablet   Oral   Take 1 tablet (10 mg total) by mouth daily.   30 tablet   0   . Benzyl Alcohol (ULESFIA) 5 % LOTN      Apply enough to saturate hair and scalp, leave in place for  10 minutes, and ramps. May repeat in one week if needed.   227 g   0   . hydrOXYzine (ATARAX/VISTARIL) 25 MG tablet   Oral   Take 1 tablet (25 mg total) by mouth every 4 (four) hours as needed for itching.   30 tablet   0   . olmesartan (BENICAR) 20 MG tablet   Oral   Take 1 tablet (20 mg total) by mouth daily.   30 tablet   3   . omeprazole (PRILOSEC) 20 MG capsule   Oral   Take 20 mg by mouth daily.          . permethrin (ELIMITE) 5 % cream      Apply from the neck down, rinse off after 12 hours.   180 g   1     Please administer three 60 g tubes   . PredniSONE 10 MG KIT      12 day taper dose pack. Use as directed   48 each   0   . ranitidine (ZANTAC) 150 MG capsule   Oral   Take 1 capsule (150 mg total) by mouth daily.   30 capsule   0   . sertraline (ZOLOFT) 50 MG tablet   Oral   Take 50 mg by mouth daily.         . SYMBICORT 160-4.5 MCG/ACT inhaler      inhale 2 puffs INTO THE LUNGS twice a day   10.2 g   3   . VENTOLIN HFA 108 (90 BASE) MCG/ACT inhaler      INHALE 1 PUFF BY MOUTH AS NEEDED AS DIRECTED   18 g   3    BP 90/47  Pulse 95  Temp(Src) 99.1 F (37.3 C) (Oral)  Resp 19  SpO2 99% Physical Exam  Nursing note and vitals reviewed. Constitutional: She is oriented to person, place, and time. Vital signs are normal. She appears well-developed and well-nourished. No distress.  HENT:  Head: Normocephalic and atraumatic. Hair is abnormal (nits clearly seen in the base of some of her hairs).  Pulmonary/Chest: Effort normal. No respiratory distress.  Neurological: She is alert and oriented to person, place, and time. She has normal strength. Coordination normal.  Skin: Skin is warm and dry. Rash noted. Rash is maculopapular (erythematous, excoriated maculopapular rash on the arms, upper back). She is not diaphoretic.  Psychiatric: She has a normal mood and affect. Judgment normal.    ED Course  Procedures (including critical care  time) Labs Review Labs Reviewed - No data to display Imaging Review No results found.  MDM   1. Rash   2. Lice   3. Scabies    Treat symptomatically as well as with pyrethroids.  F/U if not improving    Meds ordered this encounter  Medications  . methylPREDNISolone sodium succinate (SOLU-MEDROL) 125 mg/2 mL injection 125 mg    Sig:   . permethrin (ELIMITE) 5 % cream    Sig: Apply from the neck down, rinse off after 12 hours.    Dispense:  180 g    Refill:  1    Please administer three 60 g tubes    Order Specific Question:  Supervising Provider    Answer:  Lorenz Coaster, DAVID C [id number]  . ranitidine (ZANTAC) 150 MG capsule    Sig: Take 1 capsule (150 mg total) by mouth daily.    Dispense:  30 capsule    Refill:  0    Order Specific Question:  Supervising Provider    Answer:  Lorenz Coaster, DAVID C [id number]  . PredniSONE 10 MG KIT    Sig: 12 day taper dose pack. Use as directed    Dispense:  48 each    Refill:  0    Order Specific Question:  Supervising Provider    Answer:  Lorenz Coaster, DAVID C [id number]  . hydrOXYzine (ATARAX/VISTARIL) 25 MG tablet    Sig: Take 1 tablet (25 mg total) by mouth every 4 (four) hours as needed for itching.    Dispense:  30 tablet    Refill:  0    Order Specific Question:  Supervising Provider    Answer:  Lorenz Coaster, DAVID C [id number]  . Benzyl Alcohol (ULESFIA) 5 % LOTN    Sig: Apply enough to saturate hair and scalp, leave in place for 10 minutes, and ramps. May repeat in one week if needed.    Dispense:  227 g    Refill:  0    Order Specific Question:  Supervising Provider    Answer:  Lorenz Coaster, DAVID C [[id number]]       Graylon Good, PA-C 09/15/13 1923

## 2013-09-15 NOTE — ED Provider Notes (Signed)
Medical screening examination/treatment/procedure(s) were performed by non-physician practitioner and as supervising physician I was immediately available for consultation/collaboration.  Leslee Home, M.D.  Reuben Likes, MD 09/15/13 [phone]

## 2013-09-17 ENCOUNTER — Ambulatory Visit (INDEPENDENT_AMBULATORY_CARE_PROVIDER_SITE_OTHER): Payer: BC Managed Care – PPO | Admitting: Family Medicine

## 2013-09-17 ENCOUNTER — Encounter: Payer: Self-pay | Admitting: Family Medicine

## 2013-09-17 VITALS — BP 126/82 | HR 90 | Temp 97.7°F

## 2013-09-17 DIAGNOSIS — L282 Other prurigo: Secondary | ICD-10-CM

## 2013-09-17 MED ORDER — PERMETHRIN 5 % EX CREA: TOPICAL_CREAM | CUTANEOUS | Status: DC

## 2013-09-17 NOTE — Progress Notes (Signed)
  Subjective:    Patient ID: [name], female    DOB: Dec 26, 1965, 47 y.o.   MRN: [id number]  HPI Urgent care followup for severe pruritic rash. Patient presented there couple days ago. She states for about 2 weeks she had severe pruritus involving her scalp and upper extremities and trunk. She tried hydrocortisone cream without improvement. She tried changing laundry detergent and soaps but symptoms progressed. Patient describes seeing something in her hair which may have looked like lice. Only one of her 2 children had mild symptoms of itching. Patient went to urgent care and was diagnosed with probable scabies. She was given intramuscular steroid 125 mg along with oral prednisone taper, Atarax, and treated with permethrin 5% cream. She's also tried over-the-counter topicals for her scalp for lice. Her itching is improving. She has severe excoriations on her extremities. She is mostly dealing with anxiety issues at this point and cannot trace where she might have picked up anything infectious.  Past Medical History  Diagnosis Date  . Depression   . Hypertension   . Asthma   . GERD (gastroesophageal reflux disease)   . Hepatitis A   . Jaundice   . Blood transfusion   . Migraines   . UTI (urinary tract infection)   . Ovarian cyst    Past Surgical History  Procedure Laterality Date  . Appendectomy  1994  . Ovarian cyst removal    . Tonsillectomy and adenoidectomy      reports that she has never smoked. She has never used smokeless tobacco. She reports that she does not drink alcohol or use illicit drugs. family history includes Asthma in her son; Breast cancer in her maternal grandmother; Crohn's disease in her father and maternal aunt; Esophageal cancer in her maternal uncle; Hypertension in her other. Allergies  Allergen Reactions  . Aspirin Shortness Of Breath    REACTION: wheezing problems  . Codeine Sulfate Hives and Nausea And Vomiting    REACTION: rash, itiching  .  Hydrocodone-Acetaminophen Itching and Rash      Review of Systems  Constitutional: Negative for fever and chills.  Skin: Positive for rash.       Objective:   Physical Exam  Constitutional: She appears well-developed and well-nourished.  Cardiovascular: Normal rate and regular rhythm.   Skin: Rash noted.  Patient has multiple excoriations upper back and upper extremities bilaterally. No signs of secondary infection.  Scalp is examined. No evidence for any visible nits or lice          Assessment & Plan:  Severe pruritus with multiple excoriations as above. Possible scabies. She is symptomatically improved following treatment with permethrin cream. We gave her 1 refill. Continue Atarax as needed. Patient has done appropriate things in terms of washing her linens and clothes after treatment.

## 2013-09-17 NOTE — Patient Instructions (Signed)
Scabies  Scabies are small bugs (mites) that burrow under the skin and cause red bumps and severe itching. These bugs can only be seen with a microscope. Scabies are highly contagious. They can spread easily from person to person by direct contact. They are also spread through sharing clothing or linens that have the scabies mites living in them. It is not unusual for an entire family to become infected through shared towels, clothing, or bedding.   HOME CARE INSTRUCTIONS   · Your caregiver may prescribe a cream or lotion to kill the mites. If cream is prescribed, massage the cream into the entire body from the neck to the bottom of both feet. Also massage the cream into the scalp and face if your child is less than 1 year old. Avoid the eyes and mouth. Do not wash your hands after application.  · Leave the cream on for 8 to 12 hours. Your child should bathe or shower after the 8 to 12 hour application period. Sometimes it is helpful to apply the cream to your child right before bedtime.  · One treatment is usually effective and will eliminate approximately 95% of infestations. For severe cases, your caregiver may decide to repeat the treatment in 1 week. Everyone in your household should be treated with one application of the cream.  · New rashes or burrows should not appear within 24 to 48 hours after successful treatment. However, the itching and rash may last for 2 to 4 weeks after successful treatment. Your caregiver may prescribe a medicine to help with the itching or to help the rash go away more quickly.  · Scabies can live on clothing or linens for up to 3 days. All of your child's recently used clothing, towels, stuffed toys, and bed linens should be washed in hot water and then dried in a dryer for at least 20 minutes on high heat. Items that cannot be washed should be enclosed in a plastic bag for at least 3 days.  · To help relieve itching, bathe your child in a cool bath or apply cool washcloths to the  affected areas.  · Your child may return to school after treatment with the prescribed cream.  SEEK MEDICAL CARE IF:   · The itching persists longer than 4 weeks after treatment.  · The rash spreads or becomes infected. Signs of infection include red blisters or yellow-tan crust.  Document Released: 10/21/2005 Document Revised: 01/13/2012 Document Reviewed: 03/01/2009  [employer]® Patient Information ©2014 [employer].

## 2013-09-17 NOTE — Progress Notes (Signed)
Pre visit review using our clinic review tool, if applicable. No additional management support is needed unless otherwise documented below in the visit note. 

## 2013-09-22 ENCOUNTER — Other Ambulatory Visit: Payer: Self-pay | Admitting: Family Medicine

## 2013-09-24 ENCOUNTER — Telehealth: Payer: Self-pay | Admitting: Family Medicine

## 2013-09-24 ENCOUNTER — Other Ambulatory Visit: Payer: Self-pay

## 2013-09-24 NOTE — Telephone Encounter (Signed)
Pt needs refill on permethrin 5 % cream call into [facility]

## 2013-09-24 NOTE — Telephone Encounter (Signed)
RX was sent on 09-17-13 to pharmacy.

## 2013-10-21 ENCOUNTER — Telehealth: Payer: Self-pay | Admitting: Family Medicine

## 2013-10-21 NOTE — Telephone Encounter (Signed)
Pt request refill ofamphetamine-dextroamphetamine (ADDERALL) 10 MG tablet.  3 mo supply if  poss

## 2013-10-21 NOTE — Telephone Encounter (Signed)
Last visit 09-17-13 Last refill 08-20-13 #30 0 refill

## 2013-10-22 MED ORDER — AMPHETAMINE-DEXTROAMPHETAMINE 10 MG PO TABS: 10.0000 mg | ORAL_TABLET | Freq: Every day | ORAL | Status: DC

## 2013-10-22 NOTE — Telephone Encounter (Signed)
Refill for 3 months. 

## 2013-10-22 NOTE — Telephone Encounter (Signed)
Left message on VM that RX is ready for pick up 

## 2013-11-16 ENCOUNTER — Other Ambulatory Visit: Payer: Self-pay | Admitting: Family Medicine

## 2013-11-18 ENCOUNTER — Telehealth: Payer: Self-pay | Admitting: Family Medicine

## 2013-11-18 NOTE — Telephone Encounter (Signed)
Pt requesting refill of amphetamine-dextroamphetamine (ADDERALL XR) 20 MG 24 hr capsule ° °

## 2013-11-18 NOTE — Telephone Encounter (Signed)
Last visit 09/17/13 Last refill 07/14/13 #30 2

## 2013-11-18 NOTE — Telephone Encounter (Signed)
Refill OK

## 2013-11-19 MED ORDER — AMPHETAMINE-DEXTROAMPHET ER 20 MG PO CP24: 20.0000 mg | ORAL_CAPSULE | ORAL | Status: DC

## 2013-11-19 NOTE — Telephone Encounter (Signed)
Left message that RX is ready for pick up.

## 2013-12-15 ENCOUNTER — Telehealth: Payer: Self-pay | Admitting: Family Medicine

## 2013-12-15 NOTE — Telephone Encounter (Signed)
Pt returning your call

## 2013-12-15 NOTE — Telephone Encounter (Signed)
Pt request refill of amphetamine-dextroamphetamine (ADDERALL XR) 20 MG 24 hr capsule Pt states its not due until mon, but Pt would like to pu  rx on Friday b/c she is going out of town and will not be back until [name] late nite.. pls advise

## 2013-12-15 NOTE — Telephone Encounter (Signed)
Left message for patient to return call.

## 2013-12-16 ENCOUNTER — Telehealth: Payer: Self-pay

## 2013-12-16 NOTE — Telephone Encounter (Signed)
Pt informed that she was given 1 RX with 2 refills.

## 2013-12-16 NOTE — Telephone Encounter (Signed)
Pt stated that she has lost her RX's for her Adderall 20mg . Last refill 11/19/13 #30 2 refills. Is it okay to do another refill?

## 2013-12-16 NOTE — Telephone Encounter (Signed)
yes

## 2013-12-17 MED ORDER — AMPHETAMINE-DEXTROAMPHET ER 20 MG PO CP24: 20.0000 mg | ORAL_CAPSULE | ORAL | Status: DC

## 2013-12-17 NOTE — Telephone Encounter (Signed)
Left message on VM that RX is ready for pick up 

## 2014-01-17 ENCOUNTER — Telehealth: Payer: Self-pay | Admitting: Family Medicine

## 2014-01-17 NOTE — Telephone Encounter (Signed)
Pt can not find generic adderall 10 mg. Pt found adderall xr 20 mg. Pt needs another rx for generic adderall 10 mg. Pt would like to pick up rxs one month at a time

## 2014-01-17 NOTE — Telephone Encounter (Signed)
Last visit 09/17/13 Last refill 10/22/13 #30 2 refills

## 2014-01-18 NOTE — Telephone Encounter (Signed)
Refill once 

## 2014-01-19 MED ORDER — AMPHETAMINE-DEXTROAMPHETAMINE 10 MG PO TABS: 10.0000 mg | ORAL_TABLET | Freq: Every day | ORAL | Status: DC

## 2014-01-19 NOTE — Telephone Encounter (Signed)
Left message on Vm RX is ready for pick up. 

## 2014-02-14 ENCOUNTER — Telehealth: Payer: Self-pay | Admitting: Family Medicine

## 2014-02-14 MED ORDER — AMPHETAMINE-DEXTROAMPHET ER 20 MG PO CP24: 20.0000 mg | ORAL_CAPSULE | ORAL | Status: DC

## 2014-02-14 NOTE — Telephone Encounter (Signed)
Pt aware that RX is ready for pick up  

## 2014-02-14 NOTE — Telephone Encounter (Signed)
Last refill 12/17/13 #30 2 refills Last visit 09/17/13

## 2014-02-14 NOTE — Telephone Encounter (Signed)
Pt is needing new rx amphetamine-dextroamphetamine (ADDERALL XR) 20 MG 24 hr capsule, pt requesting the 20 mg xr instead of the 10 mg, pt states the 10 mg does not last long enough and she is having to take two. Please call when available for pick up

## 2014-02-14 NOTE — Telephone Encounter (Signed)
Refill Adderall XR 20 mg once daily

## 2014-02-15 ENCOUNTER — Telehealth: Payer: Self-pay | Admitting: Family Medicine

## 2014-02-15 NOTE — Telephone Encounter (Signed)
Yes.  She must bring back the 20 mg rx.

## 2014-02-15 NOTE — Telephone Encounter (Signed)
Is it okay to print out the adderall 10mg 

## 2014-02-15 NOTE — Telephone Encounter (Signed)
Pt need a new rx for adderall  10 her insurance will not allow her to fill rx for amphetamine-dextroamphetamine (ADDERALL XR) 20 MG 24 hr capsule pt will return the rx that she picked up on 02/14/14

## 2014-02-16 MED ORDER — AMPHETAMINE-DEXTROAMPHETAMINE 10 MG PO TABS: 10.0000 mg | ORAL_TABLET | Freq: Every day | ORAL | Status: DC

## 2014-02-16 NOTE — Telephone Encounter (Signed)
Pt brought in 20 mg RX's and pt was given 10 mg Rx's.

## 2014-02-20 ENCOUNTER — Encounter (HOSPITAL_COMMUNITY): Payer: Self-pay | Admitting: Emergency Medicine

## 2014-02-20 ENCOUNTER — Emergency Department (HOSPITAL_COMMUNITY)
Admission: EM | Admit: 2014-02-20 | Discharge: 2014-02-20 | Disposition: A | Discharge status: Home / Self Care | Payer: BC Managed Care – PPO | Attending: Emergency Medicine | Admitting: Emergency Medicine

## 2014-02-20 DIAGNOSIS — Z8619 Personal history of other infectious and parasitic diseases: Secondary | ICD-10-CM | POA: Insufficient documentation

## 2014-02-20 DIAGNOSIS — I10 Essential (primary) hypertension: Secondary | ICD-10-CM | POA: Insufficient documentation

## 2014-02-20 DIAGNOSIS — F3289 Other specified depressive episodes: Secondary | ICD-10-CM | POA: Insufficient documentation

## 2014-02-20 DIAGNOSIS — J45901 Unspecified asthma with (acute) exacerbation: Secondary | ICD-10-CM | POA: Insufficient documentation

## 2014-02-20 DIAGNOSIS — Z87440 Personal history of urinary (tract) infections: Secondary | ICD-10-CM | POA: Insufficient documentation

## 2014-02-20 DIAGNOSIS — Z8742 Personal history of other diseases of the female genital tract: Secondary | ICD-10-CM | POA: Insufficient documentation

## 2014-02-20 DIAGNOSIS — Z79899 Other long term (current) drug therapy: Secondary | ICD-10-CM | POA: Insufficient documentation

## 2014-02-20 DIAGNOSIS — K219 Gastro-esophageal reflux disease without esophagitis: Secondary | ICD-10-CM | POA: Insufficient documentation

## 2014-02-20 DIAGNOSIS — F329 Major depressive disorder, single episode, unspecified: Secondary | ICD-10-CM | POA: Insufficient documentation

## 2014-02-20 DIAGNOSIS — J069 Acute upper respiratory infection, unspecified: Secondary | ICD-10-CM

## 2014-02-20 MED ORDER — IPRATROPIUM BROMIDE 0.02 % IN SOLN
RESPIRATORY_TRACT | Status: AC
  Filled 2014-02-20: qty 2.5

## 2014-02-20 MED ORDER — ALBUTEROL SULFATE (2.5 MG/3ML) 0.083% IN NEBU
INHALATION_SOLUTION | RESPIRATORY_TRACT | Status: AC
  Filled 2014-02-20: qty 6

## 2014-02-20 MED ORDER — ALBUTEROL SULFATE (2.5 MG/3ML) 0.083% IN NEBU
5.0000 mg | INHALATION_SOLUTION | Freq: Once | RESPIRATORY_TRACT | Status: AC
  Administered 2014-02-20: 5 mg via RESPIRATORY_TRACT

## 2014-02-20 MED ORDER — IPRATROPIUM BROMIDE 0.02 % IN SOLN
0.5000 mg | Freq: Once | RESPIRATORY_TRACT | Status: AC
  Administered 2014-02-20: 0.5 mg via RESPIRATORY_TRACT

## 2014-02-20 MED ORDER — ALBUTEROL (5 MG/ML) CONTINUOUS INHALATION SOLN
10.0000 mg/h | INHALATION_SOLUTION | RESPIRATORY_TRACT | Status: DC
  Administered 2014-02-20: 10 mg/h via RESPIRATORY_TRACT
  Filled 2014-02-20: qty 20

## 2014-02-20 MED ORDER — PREDNISONE 20 MG PO TABS: 60.0000 mg | ORAL_TABLET | Freq: Every day | ORAL | Status: DC

## 2014-02-20 MED ORDER — METHYLPREDNISOLONE SODIUM SUCC 125 MG IJ SOLR
125.0000 mg | Freq: Once | INTRAMUSCULAR | Status: AC
  Administered 2014-02-20: 125 mg via INTRAVENOUS
  Filled 2014-02-20: qty 2

## 2014-02-20 NOTE — ED Provider Notes (Signed)
CSN: [id number]     Arrival date & time 02/20/14  9381 History   First MD Initiated Contact with Patient 02/20/14 1012     Chief Complaint  Patient presents with  . Asthma  . Cough     (Consider location/radiation/quality/duration/timing/severity/associated sxs/prior Treatment) HPI Comments: The patient is a 48 year old female with past medical history of asthma, seasonal allergies, hypertension, reflux, presenting to the emergency room with a chief complaint of wheezing for several days. The patient reports home Symbicort and Ventolin use without resolution of wheezing. The patient reports cough, nasal congestion.  Sick contacts include child at home with a viral illness. PCP: [name], MD  Patient is a 48 y.o. female presenting with asthma and cough. The history is provided by the patient. No language interpreter was used.  Asthma Associated symptoms include congestion and coughing. Pertinent negatives include no abdominal pain, chills, fever, nausea or vomiting.  Cough Associated symptoms: rhinorrhea and wheezing   Associated symptoms: no chills and no fever     Past Medical History  Diagnosis Date  . Depression   . Hypertension   . Asthma   . GERD (gastroesophageal reflux disease)   . Hepatitis A   . Jaundice   . Blood transfusion   . Migraines   . UTI (urinary tract infection)   . Ovarian cyst    Past Surgical History  Procedure Laterality Date  . Appendectomy  1994  . Ovarian cyst removal    . Tonsillectomy and adenoidectomy     Family History  Problem Relation Age of Onset  . Asthma Son   . Hypertension Other   . Crohn's disease Father   . Crohn's disease Maternal Aunt   . Esophageal cancer Maternal Uncle   . Breast cancer Maternal Grandmother    History  Substance Use Topics  . Smoking status: Never Smoker   . Smokeless tobacco: Never Used  . Alcohol Use: No   OB History   Grav Para Term Preterm Abortions TAB SAB Ect Mult Living                  Review of Systems  Constitutional: Negative for fever and chills.  HENT: Positive for congestion and rhinorrhea.   Respiratory: Positive for cough and wheezing.   Gastrointestinal: Negative for nausea, vomiting, abdominal pain, diarrhea and constipation.  Allergic/Immunologic: Positive for environmental allergies.      Allergies  Aspirin; Codeine sulfate; and Hydrocodone-acetaminophen  Home Medications   Prior to Admission medications   Medication Sig Start Date End Date Taking? Authorizing Provider  amphetamine-dextroamphetamine (ADDERALL XR) 20 MG 24 hr capsule Take 1 capsule (20 mg total) by mouth every morning. 02/14/14   [name], MD  amphetamine-dextroamphetamine (ADDERALL XR) 20 MG 24 hr capsule Take 1 capsule (20 mg total) by mouth every morning. May refill in one month 02/14/14   [name], MD  amphetamine-dextroamphetamine (ADDERALL XR) 20 MG 24 hr capsule Take 1 capsule (20 mg total) by mouth every morning. May refill in two months 02/14/14   [name], MD  amphetamine-dextroamphetamine (ADDERALL) 10 MG tablet Take 1 tablet (10 mg total) by mouth daily. May refill in one month 02/16/14   [name], MD  amphetamine-dextroamphetamine (ADDERALL) 10 MG tablet Take 1 tablet (10 mg total) by mouth daily. May refill in two months 02/16/14   [name], MD  amphetamine-dextroamphetamine (ADDERALL) 10 MG tablet Take 1 tablet (10 mg total) by mouth daily. 02/16/14   Bruce  Dan Europe, MD  Benzyl Alcohol (ULESFIA) 5 % LOTN Apply enough to saturate hair and scalp, leave in place for 10 minutes, and ramps. May repeat in one week if needed. 09/15/13   Liam Graham, PA-C  hydrOXYzine (ATARAX/VISTARIL) 25 MG tablet Take 1 tablet (25 mg total) by mouth every 4 (four) hours as needed for itching. 09/15/13   Freeman Caldron Baker, PA-C  olmesartan (BENICAR) 20 MG tablet Take 1 tablet (20 mg total) by mouth daily. 03/19/13   [name], MD  omeprazole  (PRILOSEC) 20 MG capsule Take 20 mg by mouth daily.     Historical Provider, MD  permethrin (ELIMITE) 5 % cream Apply from the neck down, rinse off after 12 hours. 09/17/13   [name], MD  PredniSONE 10 MG KIT 12 day taper dose pack. Use as directed 09/15/13   Liam Graham, PA-C  ranitidine (ZANTAC) 150 MG capsule Take 1 capsule (150 mg total) by mouth daily. 09/15/13   Freeman Caldron Baker, PA-C  sertraline (ZOLOFT) 50 MG tablet Take 50 mg by mouth daily.    Historical Provider, MD  sertraline (ZOLOFT) 50 MG tablet take 1 tablet by mouth once daily 09/22/13   [name], MD  SYMBICORT 160-4.5 MCG/ACT inhaler inhale 2 puffs INTO THE LUNGS twice a day 06/11/13   [name], MD  VENTOLIN HFA 108 (90 BASE) MCG/ACT inhaler inhale 1 puff by mouth if needed as directed 11/16/13   [name], MD   BP 143/72  Pulse 88  Temp(Src) 98.2 F (36.8 C) (Oral)  Resp 18  Ht '5\' 6"'  (1.676 m)  Wt 160 lb (72.576 kg)  BMI 25.84 kg/m2  SpO2 98% Physical Exam  Nursing note and vitals reviewed. Constitutional: She is oriented to person, place, and time. She appears well-developed and well-nourished. She is active and cooperative.  Non-toxic appearance. She does not have a sickly appearance. No distress.  HENT:  Head: Normocephalic and atraumatic.  Nose: Rhinorrhea present.  Eyes: EOM are normal. Pupils are equal, round, and reactive to light.  Neck: Neck supple.  Cardiovascular: Normal rate and regular rhythm.   The patient was not tachycardic on exam.  Pulmonary/Chest: Effort normal. No accessory muscle usage. Not tachypneic. No respiratory distress. She has no decreased breath sounds. She has wheezes. She has no rales. She exhibits no tenderness.  Patient is able to speak in complete sentences. Moderate inspiratory and expiratory wheezing in all lung fields.  Abdominal: Soft. There is no tenderness. There is no rebound and no guarding.  Musculoskeletal: Normal range of motion.   Neurological: She is alert and oriented to person, place, and time.  Skin: Skin is warm and dry. She is not diaphoretic.  Psychiatric: She has a normal mood and affect. Her behavior is normal.    ED Course  Procedures (including critical care time) Labs Review Labs Reviewed - No data to display  Imaging Review No results found.   EKG Interpretation None      MDM   Final diagnoses:  Asthma exacerbation  URI (upper respiratory infection)   The patient reports to the emergency department with wheezing, doubt pneumonia at this time.  The patient received a 5 mL duo neb prior to my evaluation. We'll order a 1 hour continuous neb and Solu-Medrol. We will reevaluate after breathing treatment has been completed. Re-eval pt reports breathing has improved, requesting to be discharged home.  Mild expiratory wheezing on exam. Discussed treatment plan with the  patient. Return precautions given. Reports understanding and no other concerns at this time.  Patient is stable for discharge at this time.  Meds given in ED:  Medications  albuterol (PROVENTIL,VENTOLIN) solution continuous neb (10 mg/hr Nebulization New Bag/Given 02/20/14 1138)  albuterol (PROVENTIL) (2.5 MG/3ML) 0.083% nebulizer solution 5 mg (5 mg Nebulization Given 02/20/14 0845)  ipratropium (ATROVENT) nebulizer solution 0.5 mg (0.5 mg Nebulization Given 02/20/14 0845)  methylPREDNISolone sodium succinate (SOLU-MEDROL) 125 mg/2 mL injection 125 mg (125 mg Intravenous Given 02/20/14 1055)    New Prescriptions   PREDNISONE (DELTASONE) 20 MG TABLET    Take 3 tablets (60 mg total) by mouth daily. Take 3 tablets by mouth daily for 4 days.      Lorrine Kin, PA-C 02/20/14 2224

## 2014-02-20 NOTE — ED Provider Notes (Signed)
Medical screening examination/treatment/procedure(s) were conducted as a shared visit with non-physician practitioner(s) and myself.  I personally evaluated the patient during the encounter.  Pt presented with peristent wheezing, known history of asthma.  I examined the patient after her hour long neb treatment.  She is feeling better and wants to go home.  Exam Alert in NAD HEENT Nasal congestion Car RRR Lungs CTA, no wheezing Able to speak in full sentences  Will dc home with oral steroids, outpatient follow up  Celene KrasJon R Benedetto Ryder, MD 02/20/14 1244

## 2014-02-20 NOTE — ED Notes (Signed)
Pt presents to department for evaluation of asthma exacerbation, cough and chest/sinus congestion. Ongoing x2 days. No relief with mucinex at home. Respirations unlabored. Speaking complete sentences. Expiratory wheezing upon arrival to ED. Pt is alert and oriented x4.

## 2014-02-20 NOTE — Discharge Instructions (Signed)
Call for a follow up appointment with a Family or Primary Care Provider.  Return if Symptoms worsen.   Take medication as prescribed.  Drink plenty of fluids.  You can continue taking Mucinex for your nasal congestion.

## 2014-02-22 NOTE — ED Provider Notes (Signed)
Medical screening examination/treatment/procedure(s) were conducted as a shared visit with non-physician practitioner(s) and myself.  I personally evaluated the patient during the encounter.  Pt improved after treatment.  Felt comfortable with discharge.  Dc home with steroids.  Continue beta agonist.  Celene KrasJon R Gavon Majano, MD 02/22/14 [phone]

## 2014-02-23 ENCOUNTER — Ambulatory Visit: Payer: BC Managed Care – PPO | Admitting: Gastroenterology

## 2014-03-04 ENCOUNTER — Ambulatory Visit: Payer: BC Managed Care – PPO | Admitting: Family Medicine

## 2014-03-04 ENCOUNTER — Telehealth: Payer: Self-pay | Admitting: Family Medicine

## 2014-03-04 NOTE — Telephone Encounter (Signed)
Pt is going to come in for appointment.

## 2014-03-04 NOTE — Telephone Encounter (Signed)
Pt requesting refill of amphetamine-dextroamphetamine (ADDERALL XR) 20 MG 24 hr capsule.  Pt states that she is completely out.  Advised her that 3 days are needed for refill requests.  Also, just wanted you to be aware of comment patient made.  When I asked her was she completely out, she stated "well, I must have done something wrong.  I've been taking 2 of my 20mg ."  Please advise.

## 2014-03-07 ENCOUNTER — Ambulatory Visit: Payer: BC Managed Care – PPO | Admitting: Family Medicine

## 2014-03-07 ENCOUNTER — Encounter: Payer: Self-pay | Admitting: Family Medicine

## 2014-03-07 ENCOUNTER — Ambulatory Visit (INDEPENDENT_AMBULATORY_CARE_PROVIDER_SITE_OTHER): Payer: BC Managed Care – PPO | Admitting: Family Medicine

## 2014-03-07 VITALS — BP 140/80 | HR 80 | Temp 97.8°F | Wt 155.0 lb

## 2014-03-07 DIAGNOSIS — J45909 Unspecified asthma, uncomplicated: Secondary | ICD-10-CM

## 2014-03-07 DIAGNOSIS — I10 Essential (primary) hypertension: Secondary | ICD-10-CM

## 2014-03-07 DIAGNOSIS — F988 Other specified behavioral and emotional disorders with onset usually occurring in childhood and adolescence: Secondary | ICD-10-CM

## 2014-03-07 MED ORDER — AMPHETAMINE-DEXTROAMPHET ER 20 MG PO CP24: 20.0000 mg | ORAL_CAPSULE | Freq: Two times a day (BID) | ORAL | Status: DC

## 2014-03-07 NOTE — Progress Notes (Signed)
Pre visit review using our clinic review tool, if applicable. No additional management support is needed unless otherwise documented below in the visit note. 

## 2014-03-07 NOTE — Progress Notes (Signed)
   Subjective:    Patient ID: [name], female    DOB: 1966-04-16, 48 y.o.   MRN: [id number]  HPI Patient seen for medical followup. She has history of hypertension, asthma, perennial allergies, and attention deficit disorder. She's had some steady weight gain in recent years. She's not exercising regularly. She had recent asthma flare up which required emergency department visit. She was given steroids and eventually improved. She finished up redness on taper about one week ago. She takes Symbicort regularly 2 puffs twice daily. Nonsmoker  Hypertension treated with Benicar 20 mg daily. She did not take her medications morning. She's not monitoring blood pressures regularly at home.  Attention deficit disorder. She's been on Adderall for about 20 some years. She's had difficulties focusing with taking Adderall XR 10 mg twice a day. She is requesting increasing dosage years she has previously taken 20 mg twice a day. She uses takes first morning dose from 5 AM second dose from 2 PM. Without second toe she has great difficulties focusing in the afternoon and early evening. She's never had insomnia issues.  Past Medical History  Diagnosis Date  . Depression   . Hypertension   . Asthma   . GERD (gastroesophageal reflux disease)   . Hepatitis A   . Jaundice   . Blood transfusion   . Migraines   . UTI (urinary tract infection)   . Ovarian cyst    Past Surgical History  Procedure Laterality Date  . Appendectomy  1994  . Ovarian cyst removal    . Tonsillectomy and adenoidectomy      reports that she has never smoked. She has never used smokeless tobacco. She reports that she does not drink alcohol or use illicit drugs. family history includes Asthma in her son; Breast cancer in her maternal grandmother; Crohn's disease in her father and maternal aunt; Esophageal cancer in her maternal uncle; Hypertension in her other. Allergies  Allergen Reactions  . Aspirin Shortness Of Breath    REACTION: wheezing problems  . Codeine Sulfate Hives and Nausea And Vomiting    REACTION: rash, itiching  . Hydrocodone-Acetaminophen Itching and Rash      Review of Systems  Constitutional: Negative for fatigue.  Eyes: Negative for visual disturbance.  Respiratory: Negative for cough, chest tightness, shortness of breath and wheezing.   Cardiovascular: Negative for chest pain, palpitations and leg swelling.  Neurological: Negative for dizziness, seizures, syncope, weakness, light-headedness and headaches.       Objective:   Physical Exam  Constitutional: She appears well-developed and well-nourished.  Neck: Neck supple. No thyromegaly present.  Cardiovascular: Normal rate.   Pulmonary/Chest: Effort normal and breath sounds normal. No respiratory distress. She has no wheezes. She has no rales.  Musculoskeletal: She exhibits no edema.          Assessment & Plan:  #1 hypertension. Borderline elevation today. Work on weight loss. Monitor blood pressures closely a home. Touch base for followup in 3 weeks. Touch base sooner if consistently over 150/90 by home readings #2 asthma. Continue regular use of Symbicort. Continue yearly flu vaccine #3 attention deficit disorder. Change Adderall XR 20 mg to twice a day. She has not had insomnia issues with her current regimen as above.

## 2014-03-07 NOTE — Patient Instructions (Signed)
Monitor blood pressure at home each day and record readings.  Call if it's over 150/90 consistently.  Bring numbers with you at your next appointment.

## 2014-03-12 ENCOUNTER — Other Ambulatory Visit: Payer: Self-pay | Admitting: Family Medicine

## 2014-03-17 ENCOUNTER — Other Ambulatory Visit: Payer: Self-pay | Admitting: Family Medicine

## 2014-03-17 NOTE — Telephone Encounter (Signed)
Pt states she needs this rx today bc she cannot breathe. [facility]. Pt did not know she was out of refills

## 2014-04-01 ENCOUNTER — Telehealth: Payer: Self-pay | Admitting: Family Medicine

## 2014-04-01 NOTE — Telephone Encounter (Signed)
Pt needs new rx generic adderall xr 20 mg °

## 2014-04-01 NOTE — Telephone Encounter (Signed)
Refill by June 3rd.

## 2014-04-01 NOTE — Telephone Encounter (Signed)
Last visit 03/07/14 Last refill 03/07/14 #60 0 refill

## 2014-04-05 NOTE — Telephone Encounter (Signed)
Pt needs rx before 5 pm on 04-06-14. Pt is going out of town

## 2014-04-06 ENCOUNTER — Other Ambulatory Visit: Payer: Self-pay

## 2014-04-06 MED ORDER — AMPHETAMINE-DEXTROAMPHET ER 20 MG PO CP24: 20.0000 mg | ORAL_CAPSULE | Freq: Two times a day (BID) | ORAL | Status: DC

## 2014-04-06 NOTE — Telephone Encounter (Signed)
Pt aware that RX is ready for pick up  

## 2014-04-17 ENCOUNTER — Other Ambulatory Visit: Payer: Self-pay | Admitting: Family Medicine

## 2014-05-03 ENCOUNTER — Telehealth: Payer: Self-pay | Admitting: Family Medicine

## 2014-05-03 NOTE — Telephone Encounter (Signed)
Refill OK

## 2014-05-03 NOTE — Telephone Encounter (Signed)
Last visit 03/07/14 Last refill 04/06/14 #60 0 refill

## 2014-05-03 NOTE — Telephone Encounter (Signed)
Pt needs new rx generic adderall xr 20 #60

## 2014-05-04 MED ORDER — AMPHETAMINE-DEXTROAMPHET ER 20 MG PO CP24: 20.0000 mg | ORAL_CAPSULE | Freq: Two times a day (BID) | ORAL | Status: DC

## 2014-05-04 NOTE — Telephone Encounter (Signed)
Left message for patient that RX is ready for pickup 

## 2014-05-17 ENCOUNTER — Telehealth: Payer: Self-pay | Admitting: Family Medicine
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
## 2014-05-18 MED ORDER — AMPHETAMINE-DEXTROAMPHET ER 20 MG PO CP24: 20.0000 mg | ORAL_CAPSULE | Freq: Two times a day (BID) | ORAL | Status: DC
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
## 2014-05-18 NOTE — Telephone Encounter (Signed)
Refill OK

## 2014-05-18 NOTE — Telephone Encounter (Signed)
Last refill 05/04/14 #60 0 refill Last visit 03/07/14

## 2014-06-13 ENCOUNTER — Telehealth: Payer: Self-pay | Admitting: Family Medicine

## 2014-06-13 NOTE — Telephone Encounter (Signed)
Pt is needing new rx amphetamine-dextroamphetamine (ADDERALL XR) 20 MG 24 hr capsule Please call when available for pick up.

## 2014-06-15 NOTE — Telephone Encounter (Signed)
Last visit 03/07/14 Last refill 05/18/14 #60 0 refill

## 2014-06-16 MED ORDER — AMPHETAMINE-DEXTROAMPHET ER 20 MG PO CP24: 20.0000 mg | ORAL_CAPSULE | Freq: Two times a day (BID) | ORAL | Status: DC

## 2014-06-16 NOTE — Telephone Encounter (Signed)
Refill OK>  Can do 3 month refill.

## 2014-06-16 NOTE — Telephone Encounter (Signed)
Left message on patient VM that RX is ready for pick up 

## 2014-06-19 ENCOUNTER — Emergency Department (HOSPITAL_COMMUNITY)
Admission: EM | Admit: 2014-06-19 | Discharge: 2014-06-19 | Disposition: A | Discharge status: Home / Self Care | Payer: BC Managed Care – PPO | Attending: Emergency Medicine | Admitting: Emergency Medicine

## 2014-06-19 ENCOUNTER — Encounter (HOSPITAL_COMMUNITY): Payer: Self-pay | Admitting: Emergency Medicine

## 2014-06-19 DIAGNOSIS — Z87440 Personal history of urinary (tract) infections: Secondary | ICD-10-CM | POA: Insufficient documentation

## 2014-06-19 DIAGNOSIS — F329 Major depressive disorder, single episode, unspecified: Secondary | ICD-10-CM | POA: Insufficient documentation

## 2014-06-19 DIAGNOSIS — R0602 Shortness of breath: Secondary | ICD-10-CM | POA: Insufficient documentation

## 2014-06-19 DIAGNOSIS — Z79899 Other long term (current) drug therapy: Secondary | ICD-10-CM | POA: Insufficient documentation

## 2014-06-19 DIAGNOSIS — I10 Essential (primary) hypertension: Secondary | ICD-10-CM | POA: Insufficient documentation

## 2014-06-19 DIAGNOSIS — K219 Gastro-esophageal reflux disease without esophagitis: Secondary | ICD-10-CM | POA: Insufficient documentation

## 2014-06-19 DIAGNOSIS — Z8742 Personal history of other diseases of the female genital tract: Secondary | ICD-10-CM | POA: Insufficient documentation

## 2014-06-19 DIAGNOSIS — F3289 Other specified depressive episodes: Secondary | ICD-10-CM | POA: Insufficient documentation

## 2014-06-19 DIAGNOSIS — G43909 Migraine, unspecified, not intractable, without status migrainosus: Secondary | ICD-10-CM | POA: Insufficient documentation

## 2014-06-19 DIAGNOSIS — J45901 Unspecified asthma with (acute) exacerbation: Secondary | ICD-10-CM | POA: Insufficient documentation

## 2014-06-19 DIAGNOSIS — Z8619 Personal history of other infectious and parasitic diseases: Secondary | ICD-10-CM | POA: Insufficient documentation

## 2014-06-19 MED ORDER — IPRATROPIUM BROMIDE 0.02 % IN SOLN
RESPIRATORY_TRACT | Status: AC
Start: 2014-06-19 — End: 2014-06-19
  Administered 2014-06-19: 0.5 mg
  Filled 2014-06-19: qty 2.5

## 2014-06-19 MED ORDER — LORAZEPAM 2 MG/ML IJ SOLN
0.5000 mg | Freq: Once | INTRAMUSCULAR | Status: AC
  Administered 2014-06-19: 0.5 mg via INTRAVENOUS
  Filled 2014-06-19: qty 1

## 2014-06-19 MED ORDER — IPRATROPIUM-ALBUTEROL 0.5-2.5 (3) MG/3ML IN SOLN
3.0000 mL | RESPIRATORY_TRACT | Status: AC
  Administered 2014-06-19: 3 mL via RESPIRATORY_TRACT

## 2014-06-19 MED ORDER — ALBUTEROL SULFATE (2.5 MG/3ML) 0.083% IN NEBU
INHALATION_SOLUTION | RESPIRATORY_TRACT | Status: AC
  Administered 2014-06-19: 5 mg
  Filled 2014-06-19: qty 6

## 2014-06-19 MED ORDER — DEXAMETHASONE 4 MG PO TABS
10.0000 mg | ORAL_TABLET | Freq: Once | ORAL | Status: AC
  Administered 2014-06-19: 10 mg via ORAL
  Filled 2014-06-19: qty 3

## 2014-06-19 MED ORDER — IPRATROPIUM-ALBUTEROL 0.5-2.5 (3) MG/3ML IN SOLN
3.0000 mL | Freq: Once | RESPIRATORY_TRACT | Status: DC
  Filled 2014-06-19: qty 3

## 2014-06-19 NOTE — ED Provider Notes (Signed)
CSN: [id number]     Arrival date & time 06/19/14  2020 History   First MD Initiated Contact with Patient 06/19/14 2023     Chief Complaint  Patient presents with  . Shortness of Breath     (Consider location/radiation/quality/duration/timing/severity/associated sxs/prior Treatment) Patient is a 48 y.o. female presenting with wheezing. The history is provided by the patient.  Wheezing Severity:  Moderate Severity compared to prior episodes:  Similar Onset quality:  Sudden Duration:  2 hours Timing:  Constant Progression:  Worsening Chronicity:  Recurrent Relieved by:  Nothing Worsened by:  Nothing tried Ineffective treatments:  None tried Associated symptoms: no chest pain, no fever, no headaches, no rhinorrhea and no shortness of breath   Risk factors: no prior hospitalizations, no prior ICU admissions and no prior intubations    48 yo F with a chief complaint shortness breath. Patient states this started about 2 hours ago. Patient with a history of asthma has had multiple exacerbations in the past. Patient feels her last was probably about 2 months ago. Patient feels that this feels like her asthma. Some coughing denies sputum production. Denies fevers or chills.  Past Medical History  Diagnosis Date  . Depression   . Hypertension   . Asthma   . GERD (gastroesophageal reflux disease)   . Hepatitis A   . Jaundice   . Blood transfusion   . Migraines   . UTI (urinary tract infection)   . Ovarian cyst    Past Surgical History  Procedure Laterality Date  . Appendectomy  1994  . Ovarian cyst removal    . Tonsillectomy and adenoidectomy     Family History  Problem Relation Age of Onset  . Asthma Son   . Hypertension Other   . Crohn's disease Father   . Crohn's disease Maternal Aunt   . Esophageal cancer Maternal Uncle   . Breast cancer Maternal Grandmother    History  Substance Use Topics  . Smoking status: Never Smoker   . Smokeless tobacco: Never Used  . Alcohol  Use: No   OB History   Grav Para Term Preterm Abortions TAB SAB Ect Mult Living                 Review of Systems  Constitutional: Negative for fever and chills.  HENT: Negative for congestion and rhinorrhea.   Eyes: Negative for redness and visual disturbance.  Respiratory: Positive for wheezing. Negative for shortness of breath.   Cardiovascular: Negative for chest pain and palpitations.  Gastrointestinal: Negative for nausea and vomiting.  Genitourinary: Negative for dysuria and urgency.  Musculoskeletal: Negative for arthralgias and myalgias.  Skin: Negative for pallor and wound.  Neurological: Negative for dizziness and headaches.      Allergies  Aspirin; Codeine sulfate; and Hydrocodone-acetaminophen  Home Medications   Prior to Admission medications   Medication Sig Start Date End Date Taking? Authorizing Provider  albuterol (PROVENTIL HFA;VENTOLIN HFA) 108 (90 BASE) MCG/ACT inhaler Inhale 2-3 puffs into the lungs every 6 (six) hours as needed for wheezing or shortness of breath.   Yes Historical Provider, MD  amphetamine-dextroamphetamine (ADDERALL XR) 20 MG 24 hr capsule Take 20 mg by mouth daily.   Yes Historical Provider, MD  budesonide-formoterol (SYMBICORT) 160-4.5 MCG/ACT inhaler Inhale 2 puffs into the lungs 2 (two) times daily.   Yes Historical Provider, MD  Dextromethorphan-Guaifenesin (MUCINEX FAST-MAX DM MAX) 5-100 MG/5ML LIQD Take 7.5 mLs by mouth daily as needed (for congestion).   Yes Historical Provider, MD  olmesartan (BENICAR) 20 MG tablet Take 20 mg by mouth daily.   Yes Historical Provider, MD  ranitidine (ZANTAC) 150 MG tablet Take 150 mg by mouth 2 (two) times daily as needed for heartburn.   Yes Historical Provider, MD  sertraline (ZOLOFT) 50 MG tablet Take 50 mg by mouth daily.   Yes Historical Provider, MD   BP 148/79  Pulse 101  Temp(Src) 98.9 F (37.2 C) (Oral)  Resp 18  SpO2 95% Physical Exam  Constitutional: She is oriented to person,  place, and time. She appears well-developed and well-nourished. No distress.  HENT:  Head: Normocephalic and atraumatic.  Eyes: EOM are normal. Pupils are equal, round, and reactive to light.  Neck: Normal range of motion. Neck supple.  Cardiovascular: Normal rate and regular rhythm.  Exam reveals no gallop and no friction rub.   No murmur heard. Pulmonary/Chest: Effort normal. She has wheezes (mild end expiratory). She has no rales.  Abdominal: Soft. She exhibits no distension. There is no tenderness.  Musculoskeletal: She exhibits no edema and no tenderness.  Neurological: She is alert and oriented to person, place, and time.  Skin: Skin is warm and dry. She is not diaphoretic.  Psychiatric: She has a normal mood and affect. Her behavior is normal.    ED Course  Procedures (including critical care time) Labs Review Labs Reviewed - No data to display  Imaging Review No results found.   EKG Interpretation None      MDM   Final diagnoses:  Asthma exacerbation    48 yo F with a chief complaint shortness of breath. Patient with mild end expiratory wheezing. Patient talking in complete sentences. We'll give one duoneb, Ativan and reassess. Given decadron.  Patient with mild wheezes post first duo neb. We'll repeat and reevaluate.  Patient reassessed and feels well is nontoxic. Patient with clear lung sounds bilaterally will discharge the patient home.  11:06 PM:  I have discussed the diagnosis/risks/treatment options with the patient and believe the pt to be eligible for discharge home to follow-up with PCP. We also discussed returning to the ED immediately if new or worsening sx occur. We discussed the sx which are most concerning (e.g., worsening shortness of breath) that necessitate immediate return. Medications administered to the patient during their visit and any new prescriptions provided to the patient are listed below.  Medications given during this visit Medications   LORazepam (ATIVAN) injection 0.5 mg (0.5 mg Intravenous Given 06/19/14 2037)  dexamethasone (DECADRON) tablet 10 mg (10 mg Oral Given 06/19/14 2050)  ipratropium (ATROVENT) 0.02 % nebulizer solution (0.5 mg  Given 06/19/14 2031)  albuterol (PROVENTIL) (2.5 MG/3ML) 0.083% nebulizer solution (5 mg  Given 06/19/14 2031)  ipratropium-albuterol (DUONEB) 0.5-2.5 (3) MG/3ML nebulizer solution 3 mL (3 mLs Nebulization Given 06/19/14 2146)    New Prescriptions   No medications on file     Melene Planan Lindsey Demonte, MD 06/19/14 2306

## 2014-06-19 NOTE — ED Notes (Signed)
Pt sts she feels better but not as good as she normally does after breathing treatment.  Pt no longer tachycardic; inspiratory wheezing noted in lungs.

## 2014-06-19 NOTE — ED Notes (Signed)
Presents with SOB, expiratory wheezes and bilateral lower lobe diminished breath sounds. EPi pen in hand, she is unsure if she came into contact wth an allergen, began sneezing, coughing and having difficulty breathing.

## 2014-06-19 NOTE — Discharge Instructions (Signed)

## 2014-06-19 NOTE — ED Provider Notes (Signed)
I saw and evaluated the patient, reviewed the resident's note and I agree with the findings and plan.   EKG Interpretation None     Patient shortness of breath. History of asthma. Feels better after treatment will be discharged home.  Juliet RudeNathan R. Rubin PayorPickering, MD 06/19/14 [phone]

## 2014-07-08 ENCOUNTER — Telehealth: Payer: Self-pay | Admitting: Family Medicine

## 2014-07-08 MED ORDER — ALBUTEROL SULFATE HFA 108 (90 BASE) MCG/ACT IN AERS: 2.0000 | INHALATION_SPRAY | Freq: Four times a day (QID) | RESPIRATORY_TRACT | Status: DC | PRN

## 2014-07-08 NOTE — Telephone Encounter (Signed)
Pt need refill on albuterol inhaler [facility]

## 2014-07-08 NOTE — Telephone Encounter (Signed)
RX sent to pharmacy  

## 2014-07-15 ENCOUNTER — Telehealth: Payer: Self-pay | Admitting: Family Medicine

## 2014-07-15 NOTE — Telephone Encounter (Signed)
Pt called said she did not need rx she forgot her rx was for 90 days

## 2014-07-15 NOTE — Telephone Encounter (Signed)
Pt needs new rx generic adderall xr 20 mg °

## 2014-07-15 NOTE — Telephone Encounter (Signed)
Pt last refill on 06/16/14. Called pt no VM set up

## 2014-08-17 ENCOUNTER — Telehealth: Payer: Self-pay | Admitting: Family Medicine

## 2014-08-17 NOTE — Telephone Encounter (Signed)
Last visit 03/07/14 Last refill 06/16/14 #60 2 refill
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
## 2014-08-17 NOTE — Telephone Encounter (Signed)
Get report then we can fill.
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
## 2014-08-18 NOTE — Telephone Encounter (Signed)
Left message for patient to return call.

## 2014-08-19 ENCOUNTER — Other Ambulatory Visit: Payer: Self-pay
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
## 2014-08-23 NOTE — Telephone Encounter (Signed)
Pt called today to see if her rx was ready. She will call back on 08/24/14 to see if it is ready for pick up

## 2014-08-24 MED ORDER — AMPHETAMINE-DEXTROAMPHET ER 20 MG PO CP24: 20.0000 mg | ORAL_CAPSULE | Freq: Two times a day (BID) | ORAL | Status: DC

## 2014-08-24 NOTE — Telephone Encounter (Signed)
OK to refill

## 2014-08-24 NOTE — Telephone Encounter (Signed)
Pt aware that Rx is ready for pick up 

## 2014-09-01 ENCOUNTER — Ambulatory Visit: Payer: BC Managed Care – PPO

## 2014-09-06 ENCOUNTER — Ambulatory Visit: Payer: BC Managed Care – PPO | Admitting: Family Medicine

## 2014-09-12 ENCOUNTER — Emergency Department (HOSPITAL_COMMUNITY): Payer: 59

## 2014-09-12 ENCOUNTER — Encounter (HOSPITAL_COMMUNITY): Payer: Self-pay | Admitting: Emergency Medicine

## 2014-09-12 ENCOUNTER — Emergency Department (HOSPITAL_COMMUNITY)
Admission: EM | Admit: 2014-09-12 | Discharge: 2014-09-12 | Disposition: A | Discharge status: Home / Self Care | Payer: 59 | Attending: Emergency Medicine | Admitting: Emergency Medicine

## 2014-09-12 DIAGNOSIS — Z8619 Personal history of other infectious and parasitic diseases: Secondary | ICD-10-CM | POA: Diagnosis not present

## 2014-09-12 DIAGNOSIS — Z87440 Personal history of urinary (tract) infections: Secondary | ICD-10-CM | POA: Insufficient documentation

## 2014-09-12 DIAGNOSIS — Z3202 Encounter for pregnancy test, result negative: Secondary | ICD-10-CM | POA: Insufficient documentation

## 2014-09-12 DIAGNOSIS — F329 Major depressive disorder, single episode, unspecified: Secondary | ICD-10-CM | POA: Diagnosis not present

## 2014-09-12 DIAGNOSIS — J45901 Unspecified asthma with (acute) exacerbation: Secondary | ICD-10-CM | POA: Diagnosis not present

## 2014-09-12 DIAGNOSIS — Z79899 Other long term (current) drug therapy: Secondary | ICD-10-CM | POA: Diagnosis not present

## 2014-09-12 DIAGNOSIS — G43909 Migraine, unspecified, not intractable, without status migrainosus: Secondary | ICD-10-CM | POA: Diagnosis not present

## 2014-09-12 DIAGNOSIS — I10 Essential (primary) hypertension: Secondary | ICD-10-CM | POA: Diagnosis not present

## 2014-09-12 DIAGNOSIS — K219 Gastro-esophageal reflux disease without esophagitis: Secondary | ICD-10-CM | POA: Diagnosis not present

## 2014-09-12 DIAGNOSIS — Z8742 Personal history of other diseases of the female genital tract: Secondary | ICD-10-CM | POA: Insufficient documentation

## 2014-09-12 DIAGNOSIS — J069 Acute upper respiratory infection, unspecified: Secondary | ICD-10-CM | POA: Insufficient documentation

## 2014-09-12 DIAGNOSIS — J45909 Unspecified asthma, uncomplicated: Secondary | ICD-10-CM | POA: Diagnosis present

## 2014-09-12 LAB — PRO B NATRIURETIC PEPTIDE: Pro B Natriuretic peptide (BNP): 76.3 pg/mL (ref 0–125)

## 2014-09-12 LAB — CBC WITH DIFFERENTIAL/PLATELET
Basophils Absolute: 0 10*3/uL (ref 0.0–0.1)
Basophils Relative: 0 % (ref 0–1)
Eosinophils Absolute: 0.2 10*3/uL (ref 0.0–0.7)
Eosinophils Relative: 2 % (ref 0–5)
HCT: 35.8 % — ABNORMAL LOW (ref 36.0–46.0)
Hemoglobin: 11.3 g/dL — ABNORMAL LOW (ref 12.0–15.0)
Lymphocytes Relative: 15 % (ref 12–46)
Lymphs Abs: 1.6 10*3/uL (ref 0.7–4.0)
MCH: 25.3 pg — ABNORMAL LOW (ref 26.0–34.0)
MCHC: 31.6 g/dL (ref 30.0–36.0)
MCV: 80.3 fL (ref 78.0–100.0)
Monocytes Absolute: 0.7 10*3/uL (ref 0.1–1.0)
Monocytes Relative: 7 % (ref 3–12)
Neutro Abs: 7.9 10*3/uL — ABNORMAL HIGH (ref 1.7–7.7)
Neutrophils Relative %: 76 % (ref 43–77)
Platelets: 328 10*3/uL (ref 150–400)
RBC: 4.46 MIL/uL (ref 3.87–5.11)
RDW: 16.3 % — ABNORMAL HIGH (ref 11.5–15.5)
WBC: 10.4 10*3/uL (ref 4.0–10.5)

## 2014-09-12 LAB — BASIC METABOLIC PANEL
Anion gap: 15 (ref 5–15)
BUN: 14 mg/dL (ref 6–23)
CO2: 22 mEq/L (ref 19–32)
Calcium: 8.9 mg/dL (ref 8.4–10.5)
Chloride: 107 mEq/L (ref 96–112)
Creatinine, Ser: 0.85 mg/dL (ref 0.50–1.10)
GFR calc Af Amer: >90 mL/min (ref >90)
GFR calc non Af Amer: 80 mL/min — ABNORMAL LOW (ref >90)
Glucose, Bld: 122 mg/dL — ABNORMAL HIGH (ref 70–99)
Potassium: 3.4 mEq/L — ABNORMAL LOW (ref 3.7–5.3)
Sodium: 144 mEq/L (ref 137–147)

## 2014-09-12 LAB — I-STAT TROPONIN, ED: Troponin i, poc: 0 ng/mL (ref 0.00–0.08)

## 2014-09-12 LAB — POC URINE PREG, ED: Preg Test, Ur: NEGATIVE

## 2014-09-12 MED ORDER — LORAZEPAM 1 MG PO TABS
1.0000 mg | ORAL_TABLET | Freq: Once | ORAL | Status: AC
  Administered 2014-09-12: 1 mg via ORAL
  Filled 2014-09-12: qty 1

## 2014-09-12 MED ORDER — ALBUTEROL SULFATE (2.5 MG/3ML) 0.083% IN NEBU: 2.5000 mg | INHALATION_SOLUTION | Freq: Four times a day (QID) | RESPIRATORY_TRACT | Status: DC | PRN

## 2014-09-12 MED ORDER — IPRATROPIUM-ALBUTEROL 0.5-2.5 (3) MG/3ML IN SOLN
3.0000 mL | RESPIRATORY_TRACT | Status: AC
  Administered 2014-09-12 (×2): 3 mL via RESPIRATORY_TRACT
  Filled 2014-09-12: qty 6

## 2014-09-12 MED ORDER — PREDNISONE 20 MG PO TABS: ORAL_TABLET | ORAL | Status: DC

## 2014-09-12 MED ORDER — IPRATROPIUM BROMIDE 0.02 % IN SOLN: 0.5000 mg | Freq: Four times a day (QID) | RESPIRATORY_TRACT | Status: DC

## 2014-09-12 MED ORDER — PREDNISONE 20 MG PO TABS
60.0000 mg | ORAL_TABLET | Freq: Once | ORAL | Status: AC
  Administered 2014-09-12: 60 mg via ORAL
  Filled 2014-09-12: qty 3

## 2014-09-12 MED ORDER — ALBUTEROL SULFATE HFA 108 (90 BASE) MCG/ACT IN AERS: 1.0000 | INHALATION_SPRAY | Freq: Four times a day (QID) | RESPIRATORY_TRACT | Status: DC | PRN

## 2014-09-12 MED ORDER — AZITHROMYCIN 250 MG PO TABS: ORAL_TABLET | ORAL | Status: DC

## 2014-09-12 MED ORDER — IPRATROPIUM-ALBUTEROL 0.5-2.5 (3) MG/3ML IN SOLN
3.0000 mL | Freq: Once | RESPIRATORY_TRACT | Status: AC
  Administered 2014-09-12: 3 mL via RESPIRATORY_TRACT
  Filled 2014-09-12: qty 3

## 2014-09-12 MED ORDER — LORAZEPAM 1 MG PO TABS: 1.0000 mg | ORAL_TABLET | Freq: Four times a day (QID) | ORAL | Status: DC | PRN

## 2014-09-12 NOTE — ED Notes (Signed)
Pt reports sudden onset of cold symptoms, pt has tried inhaler without relief. Pt with inspiratory wheezing. Pt reports chest pain yesterday.

## 2014-09-12 NOTE — ED Notes (Signed)
Patient transported to X-ray 

## 2014-09-12 NOTE — Discharge Instructions (Signed)

## 2014-09-12 NOTE — ED Notes (Signed)
Ambulated w/ pulse ox 96-100%. Slight dyspnea on exertion, NAD.

## 2014-09-12 NOTE — ED Provider Notes (Signed)
CSN: [id number]     Arrival date & time 09/12/14  [id number] History   First MD Initiated Contact with Patient 09/12/14 [phone]     Chief Complaint  Patient presents with  . Shortness of Breath  . Asthma     (Consider location/radiation/quality/duration/timing/severity/associated sxs/prior Treatment) HPI  The patient reports that she has a history of asthma. She reports starting yesterday she developed shortness of breath. She reports she also had some dry cough in association with that. The patient describes chest pain central in nature with some aching quality to it. That as well started yesterday. She does endorse some URI symptoms of nasal congestion and drainage. She tried Mucinex all night long and her albuterol inhaler. She reports they were not helping her symptoms. She reports she was worse this morning and comes for evaluation. She denies other associated symptoms.  Past Medical History  Diagnosis Date  . Depression   . Hypertension   . Asthma   . GERD (gastroesophageal reflux disease)   . Hepatitis A   . Jaundice   . Blood transfusion   . Migraines   . UTI (urinary tract infection)   . Ovarian cyst    Past Surgical History  Procedure Laterality Date  . Appendectomy  1994  . Ovarian cyst removal    . Tonsillectomy and adenoidectomy     Family History  Problem Relation Age of Onset  . Asthma Son   . Hypertension Other   . Crohn's disease Father   . Crohn's disease Maternal Aunt   . Esophageal cancer Maternal Uncle   . Breast cancer Maternal Grandmother    History  Substance Use Topics  . Smoking status: Never Smoker   . Smokeless tobacco: Never Used  . Alcohol Use: No   OB History    No data available     Review of Systems 10 Systems reviewed and are negative for acute change except as noted in the HPI.    Allergies  Aspirin; Codeine sulfate; and Hydrocodone-acetaminophen  Home Medications   Prior to Admission medications   Medication Sig Start Date End  Date Taking? Authorizing Provider  albuterol (PROVENTIL HFA;VENTOLIN HFA) 108 (90 BASE) MCG/ACT inhaler Inhale 2-3 puffs into the lungs every 6 (six) hours as needed for wheezing or shortness of breath. 07/08/14  Yes Kristian CoveyBruce W Burchette, MD  amphetamine-dextroamphetamine (ADDERALL XR) 20 MG 24 hr capsule Take 1 capsule (20 mg total) by mouth 2 (two) times daily. 08/24/14  Yes Kristian CoveyBruce W Burchette, MD  budesonide-formoterol (SYMBICORT) 160-4.5 MCG/ACT inhaler Inhale 2 puffs into the lungs 2 (two) times daily.   Yes Historical Provider, MD  Dextromethorphan-Guaifenesin (MUCINEX FAST-MAX DM MAX) 5-100 MG/5ML LIQD Take 7.5 mLs by mouth daily as needed (for congestion).   Yes Historical Provider, MD  ranitidine (ZANTAC) 150 MG tablet Take 150 mg by mouth 2 (two) times daily as needed for heartburn.   Yes Historical Provider, MD  albuterol (PROVENTIL HFA;VENTOLIN HFA) 108 (90 BASE) MCG/ACT inhaler Inhale 1-2 puffs into the lungs every 6 (six) hours as needed for wheezing or shortness of breath. 09/12/14   Arby BarretteMarcy Clarrisa Kaylor, MD  albuterol (PROVENTIL) (2.5 MG/3ML) 0.083% nebulizer solution Take 3 mLs (2.5 mg total) by nebulization every 6 (six) hours as needed for wheezing or shortness of breath. 09/12/14   Arby BarretteMarcy Orval Dortch, MD  azithromycin (ZITHROMAX Z-PAK) 250 MG tablet 2 po day one, then 1 daily x 4 days 09/12/14   Arby BarretteMarcy Francys Bolin, MD  ipratropium (ATROVENT) 0.02 % nebulizer solution Take  2.5 mLs (0.5 mg total) by nebulization 4 (four) times daily. 09/12/14   Arby BarretteMarcy Morgin Halls, MD  olmesartan (BENICAR) 20 MG tablet Take 20 mg by mouth daily.    Historical Provider, MD  predniSONE (DELTASONE) 20 MG tablet 3 tabs po day one, then 2 po daily x 4 days 09/12/14   Arby BarretteMarcy Nicolai Labonte, MD  sertraline (ZOLOFT) 50 MG tablet Take 50 mg by mouth daily.    Historical Provider, MD   BP 149/81 mmHg  Pulse 88  Temp(Src) 99.3 F (37.4 C) (Oral)  Resp 20  Ht 5\' 6"  (1.676 m)  Wt 147 lb (66.679 kg)  BMI 23.74 kg/m2  SpO2 98%  LMP  09/03/2014 Physical Exam  Constitutional: She is oriented to person, place, and time. She appears well-developed and well-nourished.  HENT:  Head: Normocephalic and atraumatic.  Nose: Nose normal.  Mouth/Throat: Oropharynx is clear and moist. No oropharyngeal exudate.  Eyes: EOM are normal.  Neck: Neck supple.  Cardiovascular: Normal rate, regular rhythm, normal heart sounds and intact distal pulses.   Pulmonary/Chest:  Mild increased work of breathing. Patient speaking in full sentences. Bilateral fine expiratory wheeze. No rhonchi no rail. Air flow to the bases.  Abdominal: Soft. There is no tenderness.  Musculoskeletal: Normal range of motion. She exhibits no edema or tenderness.  Neurological: She is alert and oriented to person, place, and time. Coordination normal.  Skin: Skin is warm and dry.  Psychiatric: She has a normal mood and affect.    ED Course  Procedures (including critical care time) Labs Review Labs Reviewed  BASIC METABOLIC PANEL - Abnormal; Notable for the following:    Potassium 3.4 (*)    Glucose, Bld 122 (*)    GFR calc non Af Amer 80 (*)    All other components within normal limits  CBC WITH DIFFERENTIAL - Abnormal; Notable for the following:    Hemoglobin 11.3 (*)    HCT 35.8 (*)    MCH 25.3 (*)    RDW 16.3 (*)    Neutro Abs 7.9 (*)    All other components within normal limits  PRO B NATRIURETIC PEPTIDE  I-STAT TROPOININ, ED  POC URINE PREG, ED    Imaging Review Dg Chest 2 View  09/12/2014   CLINICAL DATA:  Cough.  Shortness of breath.  Asthma.  EXAM: CHEST  2 VIEW  COMPARISON:  04/06/2012  FINDINGS: The heart size and mediastinal contours are within normal limits. Both lungs are clear. The visualized skeletal structures are unremarkable.  IMPRESSION: No active cardiopulmonary disease.   Electronically Signed   By: Rosalie GumsBeth  Brown M.D.   On: 09/12/2014 10:16     EKG Interpretation None     Recheck:11:10. The patient still feels that her breathing  is tight. Re-auscultation shows wheezing present in both lung fields throughout. Wheezing is slightly more pronounced now which she is consistent with some improvement in air flow. She is speaking in full sentences. The plan will be for 2 more DuoNeb's and reassessment.  Recheck: 13:00.Improved air flow.  Recheck 1400. Patient has ambulated. She reports she feels mildly short of breath with ambulation but improved relative to onset. MDM   Final diagnoses:  Asthma exacerbation  URI, acute   At this point and the patient has an asthma exacerbation with URI. She reports she's had much more severe attacks in the past. At this point she is oxygenating well and mental status is clear. Patient continues to have wheeze present however has improved air flow  and subjective condition.    Arby Barrette, MD 09/12/14 1410

## 2014-09-13 ENCOUNTER — Encounter (HOSPITAL_COMMUNITY): Payer: Self-pay | Admitting: Emergency Medicine

## 2014-09-13 ENCOUNTER — Emergency Department (HOSPITAL_COMMUNITY)
Admission: EM | Admit: 2014-09-13 | Discharge: 2014-09-13 | Disposition: A | Discharge status: Home / Self Care | Payer: 59 | Attending: Emergency Medicine | Admitting: Emergency Medicine

## 2014-09-13 DIAGNOSIS — G43909 Migraine, unspecified, not intractable, without status migrainosus: Secondary | ICD-10-CM | POA: Diagnosis not present

## 2014-09-13 DIAGNOSIS — Z8619 Personal history of other infectious and parasitic diseases: Secondary | ICD-10-CM | POA: Diagnosis not present

## 2014-09-13 DIAGNOSIS — R0602 Shortness of breath: Secondary | ICD-10-CM | POA: Diagnosis present

## 2014-09-13 DIAGNOSIS — F329 Major depressive disorder, single episode, unspecified: Secondary | ICD-10-CM | POA: Diagnosis not present

## 2014-09-13 DIAGNOSIS — Z7952 Long term (current) use of systemic steroids: Secondary | ICD-10-CM | POA: Diagnosis not present

## 2014-09-13 DIAGNOSIS — Z79899 Other long term (current) drug therapy: Secondary | ICD-10-CM | POA: Insufficient documentation

## 2014-09-13 DIAGNOSIS — Z87440 Personal history of urinary (tract) infections: Secondary | ICD-10-CM | POA: Insufficient documentation

## 2014-09-13 DIAGNOSIS — I10 Essential (primary) hypertension: Secondary | ICD-10-CM | POA: Diagnosis not present

## 2014-09-13 DIAGNOSIS — J45901 Unspecified asthma with (acute) exacerbation: Secondary | ICD-10-CM | POA: Insufficient documentation

## 2014-09-13 DIAGNOSIS — Z8742 Personal history of other diseases of the female genital tract: Secondary | ICD-10-CM | POA: Diagnosis not present

## 2014-09-13 DIAGNOSIS — K219 Gastro-esophageal reflux disease without esophagitis: Secondary | ICD-10-CM | POA: Insufficient documentation

## 2014-09-13 DIAGNOSIS — F32A Depression, unspecified: Secondary | ICD-10-CM

## 2014-09-13 DIAGNOSIS — Z792 Long term (current) use of antibiotics: Secondary | ICD-10-CM | POA: Diagnosis not present

## 2014-09-13 LAB — ETHANOL: Alcohol, Ethyl (B): <11 mg/dL (ref 0–11)

## 2014-09-13 LAB — CBC WITH DIFFERENTIAL/PLATELET
Basophils Absolute: 0 10*3/uL (ref 0.0–0.1)
Basophils Relative: 0 % (ref 0–1)
Eosinophils Absolute: 0 10*3/uL (ref 0.0–0.7)
Eosinophils Relative: 0 % (ref 0–5)
HCT: 36.4 % (ref 36.0–46.0)
Hemoglobin: 11.9 g/dL — ABNORMAL LOW (ref 12.0–15.0)
Lymphocytes Relative: 5 % — ABNORMAL LOW (ref 12–46)
Lymphs Abs: 0.6 10*3/uL — ABNORMAL LOW (ref 0.7–4.0)
MCH: 25.4 pg — ABNORMAL LOW (ref 26.0–34.0)
MCHC: 32.7 g/dL (ref 30.0–36.0)
MCV: 77.8 fL — ABNORMAL LOW (ref 78.0–100.0)
Monocytes Absolute: 0.2 10*3/uL (ref 0.1–1.0)
Monocytes Relative: 1 % — ABNORMAL LOW (ref 3–12)
Neutro Abs: 12.3 10*3/uL — ABNORMAL HIGH (ref 1.7–7.7)
Neutrophils Relative %: 94 % — ABNORMAL HIGH (ref 43–77)
Platelets: 395 10*3/uL (ref 150–400)
RBC: 4.68 MIL/uL (ref 3.87–5.11)
RDW: 16.5 % — ABNORMAL HIGH (ref 11.5–15.5)
WBC: 13.1 10*3/uL — ABNORMAL HIGH (ref 4.0–10.5)

## 2014-09-13 LAB — COMPREHENSIVE METABOLIC PANEL
ALT: 14 U/L (ref 0–35)
AST: 15 U/L (ref 0–37)
Albumin: 3.9 g/dL (ref 3.5–5.2)
Alkaline Phosphatase: 80 U/L (ref 39–117)
Anion gap: 17 — ABNORMAL HIGH (ref 5–15)
BUN: 10 mg/dL (ref 6–23)
CO2: 21 mEq/L (ref 19–32)
Calcium: 9.8 mg/dL (ref 8.4–10.5)
Chloride: 103 mEq/L (ref 96–112)
Creatinine, Ser: 0.65 mg/dL (ref 0.50–1.10)
GFR calc Af Amer: >90 mL/min (ref >90)
GFR calc non Af Amer: >90 mL/min (ref >90)
Glucose, Bld: 154 mg/dL — ABNORMAL HIGH (ref 70–99)
Potassium: 3.9 mEq/L (ref 3.7–5.3)
Sodium: 141 mEq/L (ref 137–147)
Total Bilirubin: 0.3 mg/dL (ref 0.3–1.2)
Total Protein: 7.9 g/dL (ref 6.0–8.3)

## 2014-09-13 LAB — RAPID URINE DRUG SCREEN, HOSP PERFORMED
Amphetamines: NOT DETECTED
Barbiturates: NOT DETECTED
Benzodiazepines: NOT DETECTED
Cocaine: NOT DETECTED
Opiates: NOT DETECTED
Tetrahydrocannabinol: NOT DETECTED

## 2014-09-13 LAB — ACETAMINOPHEN LEVEL: Acetaminophen (Tylenol), Serum: <15 ug/mL (ref 10–30)

## 2014-09-13 LAB — SALICYLATE LEVEL: Salicylate Lvl: <2 mg/dL — ABNORMAL LOW (ref 2.8–20.0)

## 2014-09-13 MED ORDER — AMPHETAMINE-DEXTROAMPHETAMINE 20 MG PO TABS: 20.0000 mg | ORAL_TABLET | Freq: Two times a day (BID) | ORAL | Status: DC

## 2014-09-13 MED ORDER — SERTRALINE HCL 50 MG PO TABS: 5.0000 mg | ORAL_TABLET | Freq: Every day | ORAL | Status: DC

## 2014-09-13 MED ORDER — IPRATROPIUM-ALBUTEROL 0.5-2.5 (3) MG/3ML IN SOLN
3.0000 mL | Freq: Once | RESPIRATORY_TRACT | Status: AC
  Administered 2014-09-13: 3 mL via RESPIRATORY_TRACT
  Filled 2014-09-13: qty 3

## 2014-09-13 NOTE — Discharge Instructions (Signed)
Follow-up with your primary care doctor in the next few days for a recheck and further prescriptions of your medications.  Return to the emergency department if your symptoms substantially worsen or change.   Depression Depression refers to feeling sad, low, down in the dumps, blue, gloomy, or empty. In general, there are two kinds of depression: 1. Normal sadness or normal grief. This kind of depression is one that we all feel from time to time after upsetting life experiences, such as the loss of a job or the ending of a relationship. This kind of depression is considered normal, is short lived, and resolves within a few days to 2 weeks. Depression experienced after the loss of a loved one (bereavement) often lasts longer than 2 weeks but normally gets better with time. 2. Clinical depression. This kind of depression lasts longer than normal sadness or normal grief or interferes with your ability to function at home, at work, and in school. It also interferes with your personal relationships. It affects almost every aspect of your life. Clinical depression is an illness. Symptoms of depression can also be caused by conditions other than those mentioned above, such as:  Physical illness. Some physical illnesses, including underactive thyroid gland (hypothyroidism), severe anemia, specific types of cancer, diabetes, uncontrolled seizures, heart and lung problems, strokes, and chronic pain are commonly associated with symptoms of depression.  Side effects of some prescription medicine. In some people, certain types of medicine can cause symptoms of depression.  Substance abuse. Abuse of alcohol and illicit drugs can cause symptoms of depression. SYMPTOMS Symptoms of normal sadness and normal grief include the following:  Feeling sad or crying for short periods of time.  Not caring about anything (apathy).  Difficulty sleeping or sleeping too much.  No longer able to enjoy the things you used  to enjoy.  Desire to be by oneself all the time (social isolation).  Lack of energy or motivation.  Difficulty concentrating or remembering.  Change in appetite or weight.  Restlessness or agitation. Symptoms of clinical depression include the same symptoms of normal sadness or normal grief and also the following symptoms:  Feeling sad or crying all the time.  Feelings of guilt or worthlessness.  Feelings of hopelessness or helplessness.  Thoughts of suicide or the desire to harm yourself (suicidal ideation).  Loss of touch with reality (psychotic symptoms). Seeing or hearing things that are not real (hallucinations) or having false beliefs about your life or the people around you (delusions and paranoia). DIAGNOSIS  The diagnosis of clinical depression is usually based on how bad the symptoms are and how long they have lasted. Your health care provider will also ask you questions about your medical history and substance use to find out if physical illness, use of prescription medicine, or substance abuse is causing your depression. Your health care provider may also order blood tests. TREATMENT  Often, normal sadness and normal grief do not require treatment. However, sometimes antidepressant medicine is given for bereavement to ease the depressive symptoms until they resolve. The treatment for clinical depression depends on how bad the symptoms are but often includes antidepressant medicine, counseling with a mental health professional, or both. Your health care provider will help to determine what treatment is best for you. Depression caused by physical illness usually goes away with appropriate medical treatment of the illness. If prescription medicine is causing depression, talk with your health care provider about stopping the medicine, decreasing the dose, or changing to another  medicine. Depression caused by the abuse of alcohol or illicit drugs goes away when you stop using these  substances. Some adults need professional help in order to stop drinking or using drugs. SEEK IMMEDIATE MEDICAL CARE IF:  You have thoughts about hurting yourself or others.  You lose touch with reality (have psychotic symptoms).  You are taking medicine for depression and have a serious side effect. FOR MORE INFORMATION  National Alliance on Mental Illness: www.nami.org  National Institute of Mental Health: http://www.maynard.net/www.nimh.nih.gov Document Released: 10/18/2000 Document Revised: 03/07/2014 Document Reviewed: 01/20/2012 [facility] Patient Information 2015 [place], [place]. This information is not intended to replace advice given to you by your health care provider. [employer]Make sure you discuss any questions you have with your health care provider.    Emergency Department Resource Guide 1) Find a Doctor and Pay Out of Pocket Although you won't have to find out who is covered by your insurance plan, it is a good idea to ask around and get recommendations. You will then need to call the office and see if the doctor you have chosen will accept you as a new patient and what types of options they offer for patients who are self-pay. Some doctors offer discounts or will set up payment plans for their patients who do not have insurance, but you will need to ask so you aren't surprised when you get to your appointment.  2) Contact Your Local Health Department Not all health departments have doctors that can see patients for sick visits, but many do, so it is worth a call to see if yours does. If you don't know where your local health department is, you can check in your phone book. The CDC also has a tool to help you locate your state's health department, and many state websites also have listings of all of their local health departments.  3) Find a Walk-in Clinic If your illness is not likely to be very severe or complicated, you may want to try a walk in clinic. These are popping up all over the country in  pharmacies, drugstores, and shopping centers. They're usually staffed by nurse practitioners or physician assistants that have been trained to treat common illnesses and complaints. They're usually fairly quick and inexpensive. However, if you have serious medical issues or chronic medical problems, these are probably not your best option.  No Primary Care Doctor: - Call Health Connect at  [phone] - they can help you locate a primary care doctor that  accepts your insurance, provides certain services, etc. - Physician Referral Service- [phone]  Chronic Pain Problems: Organization         Address  Phone   Notes  [facility]  [phone] Patients need to be referred by their primary care doctor.   Medication Assistance: Organization         Address  Phone   Notes  [facility] [address] [place], [place] [phone] --Must be a resident of [facility] [place] -- Must have NO insurance coverage whatsoever (no Medicaid/ Medicare, etc.) -- The pt. MUST have a primary care doctor that directs their care regularly and follows them in the community   Med[facility]ist  [phone]   [employer]  [phone]    Agencies that provide inexpensive medical care: Organization         Address  Phone   Notes  [facility]  [phone]   [facility]    [phone]   [facility] [address] [place], [place] [address] [phone]   [facility] [address] [place], [place] [phone]   Planned Parenthood    [phone]   [facility]    [phone]   [facility]  [address], [place] Phone:  [phone], Fax:  [phone] Hours of Operation:  9 am - 6 pm, M-F.  Also accepts Medicaid/Medicare and self-pay.  [facility]  [address],  [place] Phone: [phone], Fax: [phone]. Hours of Operation:  8:30 am - 5:30 pm, M-F.  Also accepts Medicaid and self-pay.  [facility] [place] [address] [place] Phone: [phone]   [facility] [address] Natasha Bence [place], [place] [phone], Ext. [phone] Mondays & Thursdays: 7-9 AM.  First 15 patients are seen on a first come, first serve basis.    Medicaid-accepting [facility] Providers:  Organization         Address  Phone   Notes  [facility] [address], [place] [phone] Also accepts self-pay patients.  [facility] [address] Laurell Josephs [place], [place]  [phone]   [facility] [address], [place] [phone]   [facility] [address], [place] [phone]   Renaye Rakers [address], [place]   [phone] Only accepts [place] Access [place] patients after they have their name applied to their card.   Self-Pay (no insurance) in [facility]:  Organization         Address  Phone   Notes  Sickle Cell Patients, [facility] [address] [place], [place] [phone]   [facility] [address] [place], [place] [phone]   [name] [place]  [address], [place] [phone]   [facility]/Dr. Osei-Bonsu  [address] [place] or [address], [place] [phone] Phone number for both [place] and [place] locations is the same.  [facility] [address], [place] [phone]   [facility] [address] [place] or [address] [phone]   [facility] [address] [place] [phone], phone; [phone], fax Sees patients 1st and 3rd Saturday of every month.  Must not  qualify for public or private insurance (i.e. Medicaid, Medicare, [place] Health Choice, Veterans' Benefits)  Household income should be no more than 200% of the poverty level The clinic cannot treat you if you are pregnant or think you are pregnant  Sexually transmitted diseases are not treated at the clinic.    Dental Care: Organization         Address  Phone  Notes  [facility] of [facility] [address] [place], [place] [phone] Accepts children up to age 47 who are enrolled in [place] or [place] Health Choice; pregnant women with a Medicaid card; and children who have applied for Medicaid or [place] Health Choice, but were declined, whose parents can pay a reduced fee at time of service.  [facility]  [address], [place] [phone] Accepts children up to age 21 who are enrolled in Medicaid or [place] Health Choice; pregnant women with a Medicaid card; and children who have applied for Medicaid or [place] Health Choice, but were declined, whose parents can pay a reduced fee at time of service.  °[facility] ° [address], [place] [phone] Patients are seen by appointment only. Walk-ins are not accepted. [facility] will see patients 18 years of age and older. °Monday - Tuesday (8am-5pm) °Most Wednesdays (8:30-5pm) °$30 per visit, cash only  °[facility] ° [address], [place] [phone] Patients are seen by appointment only. Walk-ins are not accepted. [facility] will see patients 18 years of age and older. °One Wednesday Evening (Monthly: Volunteer Based).  $30 per visit, cash only  °[facility]  [phone] for adults; Children under age 4, call Graduate Pediatric Dentistry at [phone]. Children aged 4-14, please call [phone] to request a pediatric application. ° Dental services are provided  in all areas of dental care including fillings, crowns and bridges, complete and partial dentures, implants, gum treatment, root canals, and extractions. Preventive care is also provided. Treatment is provided to both adults and children. °Patients are selected via a lottery and there is often a waiting list. °  °[facility] [address], °[place] ° [phone] www.drcivils.com °  °[facility] [address], [place], [place] [phone], Ext. [phone] Second and Fourth Thursday of each month, opens at 6:30 AM; Clinic ends at 9 AM.  Patients are seen on a first-come first-served basis, and a limited number are seen during each clinic.  ° °[facility] ° [address], [place], [place] [phone]   Eligibility Requirements °You must have lived in [place], [place], or [place] counties for at least the last three months. °  You cannot be eligible for state or federal sponsored healthcare insurance, including Veterans Administration, Medicaid, or Medicare. °  You generally cannot be eligible for healthcare insurance through your employer.  °  How to apply: °Eligibility screenings are held every Tuesday and Wednesday afternoon from 1:00 pm until 4:00 pm. You do not need an appointment for the interview!  °[facility] [address], [place], [place] [phone]   °[facility]  [phone]   °[facility]  [phone]   °[facility]  [phone]   ° °Behavioral Health Resources in the Community: °Intensive Outpatient Programs °Organization         Address  Phone  Notes  °[facility] [address], [place], [place] [phone]   °[facility] [address], [place], [place] [phone]   °[facility] [address], [place], [place] ° [phone]   °[facility] [address],  °[place], [place]  1-800-853-5163 or [phone]   °Substance Abuse Resources °Organization         Address  Phone  Notes  °[facility]  [phone]   °[facility]  [phone]   °[facility]  336-285-9073   °[facility]  [phone]   °Residential & Outpatient Substance Abuse Program  1-800-659-3381   °Psychological Services °Organization         Address  Phone  Notes  °[facility]  336- [phone]   °[facility]  336- [phone]   °  [place] [address] [place] or [phone]    Mobile Crisis Teams Organization         Address  Phone  Notes  [facility], Mobile Crisis Care Unit  [phone]   [facility]  [address] [place], [place]   [place] Levels [address], [place] [phone]    Self-Help/Support Groups Organization         Address  Phone             Notes  [place]. of [place] - variety of support groups  [place] Call for more information  Narcotics Anonymous (NA), [facility] [address], [employer] [place]  2 meetings at this location   Special educational needs teacher         Address  Phone  Notes  [facility] [place],    [place]  [phone]   [facility]  [address] [place] [id number], [place], [place]   Monona Derby, [place] [phone] Admissions: 8am-3pm M-F  [facility]stance [place] [address],    [place], [place] [phone]   [facility] [address] [place], [place], [place]   [facility] [address],  [place], [place]   [facility] - Intensive Outpatient [address] Krist[address], [place], [place]   [facility] ([place]  [place], [place] [phone] or  [phone]   [facility] ([facility]) [address], [place], [place] Accepts Medicaid  [facility] [place] [address],  [place] 1-9140997817 Substance Abuse/Addiction Treatment   [facility] Organization         Address  Phone  Notes  [facility]  [phone]   Domenic Schwab, PhD [address] Arlis Port[place], [place]   ([phone]) [phone] or [phone]   [place], [place] [phone]   [facility] [address] [place], [place] [phone] Insurance/Medicaid/sponsorship through [facility] and Families [address], [address] [place]                                    [place], [place] [phone] [place] [address], [place] [phone]    Dr. Adele Schilder  [phone]   [facility] Dept. 1) [address], [place] 2) [place] 3)  [address], [place] [phone]  [phone]   [place] [phone] or [phone] (After Hours)

## 2014-09-13 NOTE — ED Notes (Addendum)
Pt. reports insomnia for 2 days , " I feel groggy " , family reported pt. has been  irritable  " not herself" and requesting mental health evaluation , denies suicidal ideation , pt. is poor historian , denies pain , respirations unlabored .  Pt. was seen here this afternoon for asthma exacerbation  with mild expiratory wheezing at triage.

## 2014-09-13 NOTE — ED Provider Notes (Signed)
CSN: [id number]     Arrival date & time 09/13/14  0125 History   First MD Initiated Contact with Patient 09/13/14 0257     Chief Complaint  Patient presents with  . Insomnia  . Shortness of Breath     (Consider location/radiation/quality/duration/timing/severity/associated sxs/prior Treatment) HPI Comments: Patient is a 48 year old female with history of anxiety, depression, ADHD, and asthma. She is brought to the emergency department by her daughter and mother for evaluation of erratic behavior. They state that she has "not been herself" for the past several weeks. She is apparently met a female individual who her family believes is influencing her to drink alcohol and to do drugs. The patient denies this. She tells me she is not quite sure why she is here.  Patient is a 48 y.o. female presenting with shortness of breath. The history is provided by the patient.  Shortness of Breath Severity:  Moderate Onset quality:  Gradual Duration:  2 weeks Timing:  Constant Progression:  Worsening Chronicity:  New Relieved by:  Nothing Worsened by:  Nothing tried   Past Medical History  Diagnosis Date  . Depression   . Hypertension   . Asthma   . GERD (gastroesophageal reflux disease)   . Hepatitis A   . Jaundice   . Blood transfusion   . Migraines   . UTI (urinary tract infection)   . Ovarian cyst    Past Surgical History  Procedure Laterality Date  . Appendectomy  1994  . Ovarian cyst removal    . Tonsillectomy and adenoidectomy     Family History  Problem Relation Age of Onset  . Asthma Son   . Hypertension Other   . Crohn's disease Father   . Crohn's disease Maternal Aunt   . Esophageal cancer Maternal Uncle   . Breast cancer Maternal Grandmother    History  Substance Use Topics  . Smoking status: Never Smoker   . Smokeless tobacco: Never Used  . Alcohol Use: No   OB History    No data available     Review of Systems  Respiratory: Positive for shortness of  breath.   All other systems reviewed and are negative.     Allergies  Aspirin; Codeine sulfate; and Hydrocodone-acetaminophen  Home Medications   Prior to Admission medications   Medication Sig Start Date End Date Taking? Authorizing Provider  albuterol (PROVENTIL HFA;VENTOLIN HFA) 108 (90 BASE) MCG/ACT inhaler Inhale 2-3 puffs into the lungs every 6 (six) hours as needed for wheezing or shortness of breath. 07/08/14  Yes [name], MD  amphetamine-dextroamphetamine (ADDERALL XR) 20 MG 24 hr capsule Take 1 capsule (20 mg total) by mouth 2 (two) times daily. 08/24/14  Yes [name], MD  budesonide-formoterol (SYMBICORT) 160-4.5 MCG/ACT inhaler Inhale 2 puffs into the lungs 2 (two) times daily.   Yes Historical Provider, MD  Dextromethorphan-Guaifenesin ([place] FAST-MAX DM MAX) 5-100 MG/5ML LIQD Take 7.5 mLs by mouth daily as needed (for congestion).   Yes Historical Provider, MD  LORazepam (ATIVAN) 1 MG tablet Take 1 tablet (1 mg total) by mouth every 6 (six) hours as needed for anxiety. 09/12/14  Yes Charlesetta Shanks, MD  ranitidine (ZANTAC) 150 MG tablet Take 150 mg by mouth 2 (two) times daily as needed for heartburn.   Yes Historical Provider, MD  sertraline (ZOLOFT) 50 MG tablet Take 50 mg by mouth daily.   Yes Historical Provider, MD  albuterol (PROVENTIL HFA;VENTOLIN HFA) 108 (90 BASE) MCG/ACT inhaler Inhale 1-2 puffs  into the lungs every 6 (six) hours as needed for wheezing or shortness of breath. 09/12/14   Charlesetta Shanks, MD  albuterol (PROVENTIL) (2.5 MG/3ML) 0.083% nebulizer solution Take 3 mLs (2.5 mg total) by nebulization every 6 (six) hours as needed for wheezing or shortness of breath. 09/12/14   Charlesetta Shanks, MD  azithromycin (ZITHROMAX Z-PAK) 250 MG tablet 2 po day one, then 1 daily x 4 days 09/12/14   Charlesetta Shanks, MD  ipratropium (ATROVENT) 0.02 % nebulizer solution Take 2.5 mLs (0.5 mg total) by nebulization 4 (four) times daily. 09/12/14   Charlesetta Shanks, MD   olmesartan (BENICAR) 20 MG tablet Take 20 mg by mouth daily.    Historical Provider, MD  predniSONE (DELTASONE) 20 MG tablet 3 tabs po day one, then 2 po daily x 4 days 09/12/14   Charlesetta Shanks, MD   BP 173/91 mmHg  Pulse 98  Resp 20  SpO2 98%  LMP 09/03/2014 Physical Exam  Constitutional: She is oriented to person, place, and time. She appears well-developed and well-nourished. No distress.  HENT:  Head: Normocephalic and atraumatic.  Neck: Normal range of motion. Neck supple.  Cardiovascular: Normal rate and regular rhythm.  Exam reveals no gallop and no friction rub.   No murmur heard. Pulmonary/Chest: Effort normal and breath sounds normal. No respiratory distress. She has no wheezes.  Abdominal: Soft. Bowel sounds are normal. She exhibits no distension. There is no tenderness.  Musculoskeletal: Normal range of motion.  There are multiple scars from what appear to be cutting on both wrists and forearms.  Neurological: She is alert and oriented to person, place, and time.  Skin: Skin is warm and dry. She is not diaphoretic.  Psychiatric: Her affect is blunt. Her speech is delayed. She is agitated. Cognition and memory are not impaired. She expresses impulsivity.  Nursing note and vitals reviewed.   ED Course  Procedures (including critical care time) Labs Review Labs Reviewed  CBC WITH DIFFERENTIAL - Abnormal; Notable for the following:    WBC 13.1 (*)    Hemoglobin 11.9 (*)    MCV 77.8 (*)    MCH 25.4 (*)    RDW 16.5 (*)    Neutrophils Relative % 94 (*)    Neutro Abs 12.3 (*)    Lymphocytes Relative 5 (*)    Lymphs Abs 0.6 (*)    Monocytes Relative 1 (*)    All other components within normal limits  COMPREHENSIVE METABOLIC PANEL - Abnormal; Notable for the following:    Glucose, Bld 154 (*)    Anion gap 17 (*)    All other components within normal limits  ETHANOL  ACETAMINOPHEN LEVEL  SALICYLATE LEVEL  URINE RAPID DRUG SCREEN (HOSP PERFORMED)    Imaging  Review Dg Chest 2 View  09/12/2014   CLINICAL DATA:  Cough.  Shortness of breath.  Asthma.  EXAM: CHEST  2 VIEW  COMPARISON:  04/06/2012  FINDINGS: The heart size and mediastinal contours are within normal limits. Both lungs are clear. The visualized skeletal structures are unremarkable.  IMPRESSION: No active cardiopulmonary disease.   Electronically Signed   By: Shon Hale M.D.   On: 09/12/2014 10:16     Date: 09/14/2014  Rate: 90's  Rhythm: normal sinus rhythm  QRS Axis: left  Intervals: normal  ST/T Wave abnormalities: normal  Conduction Disutrbances:none  Narrative Interpretation:   Old EKG Reviewed: unchanged    MDM   Final diagnoses:  None    Patient brought by mother  and daughter for evaluation of mental health issues. They are concerned that she is exhibiting self-destructive behavior. She has been apparently having a relationship with a man whom I find undesirable and believe she is drinking alcohol and doing drugs again. The patient denies this to me. She does not feel as though she is in need of help and does not want to be admitted for this.  She was evaluated by TTS. She apparently reported she has had fleeting thoughts of suicidal ideation, but denies to me that she is actively suicidal and assures me she would never harm herself. She is willing to sign a contract for safety and this was done.  I do not feel as though she meets requirements for involuntary commitment and I feel as though she has the decision-making capacity to decline inpatient psychiatric care and return to her current situation. I'm not sure that this pleased the family that was present at bedside although I attempted to explain to them the rationale for her discharge. She was provided with outpatient resources and understands to return if her situation changes or worsens.    Veryl Speak, MD 09/14/14 [phone]
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
## 2014-09-13 NOTE — ED Notes (Signed)
Daughter: [phone] (Cell); [phone] (Home) Please call when it's time to pick pt up.

## 2014-09-13 NOTE — ED Notes (Signed)
Ordered diet tray 

## 2014-09-13 NOTE — ED Notes (Signed)
Breakfast tray ordered 

## 2014-09-13 NOTE — ED Notes (Signed)
telepsych being conducted at the bedside.

## 2014-09-13 NOTE — ED Notes (Signed)
Family at bedside. 

## 2014-09-13 NOTE — ED Notes (Signed)
Pt signed a no harm contract, pt given resources for mental health facilities in the community.

## 2014-09-20 ENCOUNTER — Ambulatory Visit: Payer: BC Managed Care – PPO

## 2014-09-21 ENCOUNTER — Ambulatory Visit (INDEPENDENT_AMBULATORY_CARE_PROVIDER_SITE_OTHER): Payer: 59 | Admitting: Licensed Clinical Social Worker

## 2014-09-21 DIAGNOSIS — F3181 Bipolar II disorder: Secondary | ICD-10-CM

## 2014-09-26 ENCOUNTER — Telehealth: Payer: Self-pay | Admitting: Family Medicine

## 2014-09-26 NOTE — Telephone Encounter (Signed)
Pt went to ED was only given #10  Last visit 03/07/14 Last refill 09/13/14 #10 0 refill

## 2014-09-26 NOTE — Telephone Encounter (Signed)
Patient needs a re-fill on amphetamine-dextroamphetamine (ADDERALL) 20 MG tablet. She was only give a RX for 5 days.

## 2014-09-26 NOTE — Telephone Encounter (Signed)
Refill once only. NEEDS office follow up.

## 2014-09-27 MED ORDER — AMPHETAMINE-DEXTROAMPHETAMINE 20 MG PO TABS: 20.0000 mg | ORAL_TABLET | Freq: Two times a day (BID) | ORAL | Status: DC

## 2014-09-27 NOTE — Telephone Encounter (Signed)
Left message that RX is ready for pick up.

## 2014-09-28 ENCOUNTER — Ambulatory Visit: Payer: 59 | Admitting: Family Medicine

## 2014-10-03 ENCOUNTER — Ambulatory Visit (INDEPENDENT_AMBULATORY_CARE_PROVIDER_SITE_OTHER): Payer: 59 | Admitting: Licensed Clinical Social Worker

## 2014-10-03 DIAGNOSIS — F3181 Bipolar II disorder: Secondary | ICD-10-CM

## 2014-10-07 ENCOUNTER — Other Ambulatory Visit: Payer: Self-pay | Admitting: Family Medicine

## 2014-10-07 NOTE — Telephone Encounter (Signed)
Ok to refill for 15 days to appt with PCP. Thanks.

## 2014-10-10 ENCOUNTER — Ambulatory Visit (INDEPENDENT_AMBULATORY_CARE_PROVIDER_SITE_OTHER): Payer: 59 | Admitting: Licensed Clinical Social Worker

## 2014-10-10 DIAGNOSIS — F3181 Bipolar II disorder: Secondary | ICD-10-CM

## 2014-10-14 ENCOUNTER — Ambulatory Visit: Payer: 59 | Admitting: Licensed Clinical Social Worker

## 2014-10-20 ENCOUNTER — Ambulatory Visit: Payer: 59 | Admitting: Family Medicine

## 2014-10-27 ENCOUNTER — Encounter: Payer: Self-pay | Admitting: Family Medicine

## 2014-10-27 ENCOUNTER — Ambulatory Visit (INDEPENDENT_AMBULATORY_CARE_PROVIDER_SITE_OTHER): Payer: 59 | Admitting: Family Medicine

## 2014-10-27 VITALS — BP 130/80 | HR 93 | Temp 98.6°F

## 2014-10-27 DIAGNOSIS — F411 Generalized anxiety disorder: Secondary | ICD-10-CM

## 2014-10-27 DIAGNOSIS — F988 Other specified behavioral and emotional disorders with onset usually occurring in childhood and adolescence: Secondary | ICD-10-CM

## 2014-10-27 DIAGNOSIS — F909 Attention-deficit hyperactivity disorder, unspecified type: Secondary | ICD-10-CM

## 2014-10-27 MED ORDER — LORAZEPAM 1 MG PO TABS: 1.0000 mg | ORAL_TABLET | Freq: Four times a day (QID) | ORAL | Status: DC | PRN

## 2014-10-27 MED ORDER — AMPHETAMINE-DEXTROAMPHETAMINE 20 MG PO TABS: 20.0000 mg | ORAL_TABLET | Freq: Two times a day (BID) | ORAL | Status: DC

## 2014-10-27 MED ORDER — SERTRALINE HCL 100 MG PO TABS: 100.0000 mg | ORAL_TABLET | Freq: Every day | ORAL | Status: DC

## 2014-10-27 MED ORDER — ALBUTEROL SULFATE HFA 108 (90 BASE) MCG/ACT IN AERS
2.0000 | INHALATION_SPRAY | Freq: Four times a day (QID) | RESPIRATORY_TRACT | Status: DC | PRN
Start: 2014-10-27 — End: 2015-03-22

## 2014-10-27 NOTE — Progress Notes (Signed)
   Subjective:    Patient ID: [name], female    DOB: 09/14/1966, 48 y.o.   MRN: [id number]  HPI   Patient here for medical follow-up. She's had tremendous amount of anxiety recently. She is currently in counseling. She's had difficulties with interacting with other family members. Her son recently gotten trouble for shoplifting. She's had anxiety but denies any depression issues. She is currently in counseling. She takes Zoloft 50 mg daily for pervasive anxiety and worries. She does not recall being on higher doses previously. She has ADD and requests refill of Adderall. She feels more anxious when she is not taking this. She is still working full-time.  She has never had any extreme agitation or periods of extreme restlessness or any other suggestion of bipolar.  Past Medical History  Diagnosis Date  . Depression   . Hypertension   . Asthma   . GERD (gastroesophageal reflux disease)   . Hepatitis A   . Jaundice   . Blood transfusion   . Migraines   . UTI (urinary tract infection)   . Ovarian cyst    Past Surgical History  Procedure Laterality Date  . Appendectomy  1994  . Ovarian cyst removal    . Tonsillectomy and adenoidectomy      reports that she has never smoked. She has never used smokeless tobacco. She reports that she does not drink alcohol or use illicit drugs. family history includes Asthma in her son; Breast cancer in her maternal grandmother; Crohn's disease in her father and maternal aunt; Esophageal cancer in her maternal uncle; Hypertension in her other. Allergies  Allergen Reactions  . Aspirin Shortness Of Breath    REACTION: wheezing problems  . Codeine Sulfate Hives and Nausea And Vomiting    REACTION: rash, itiching  . Hydrocodone-Acetaminophen Itching and Rash      Review of Systems  Constitutional: Negative for appetite change and unexpected weight change.  Respiratory: Negative for cough and shortness of breath.   Cardiovascular:  Negative for chest pain.  Psychiatric/Behavioral: Positive for decreased concentration. Negative for dysphoric mood and agitation. The patient is nervous/anxious.        Objective:   Physical Exam  Constitutional: She is oriented to person, place, and time. She appears well-developed and well-nourished.  Cardiovascular: Normal rate and regular rhythm.   Pulmonary/Chest: Effort normal and breath sounds normal. No respiratory distress. She has no wheezes. She has no rales.  Neurological: She is alert and oriented to person, place, and time.  Psychiatric: She has a normal mood and affect. Her behavior is normal.          Assessment & Plan:  #1 attention deficit disorder. Refill Adderall for 3 months.  #2 chronic anxiety. Possible component of generalized anxiety. Increase sertraline to 100 mg daily at bedtime. Continue counseling. We discussed nonpharmacologic ways of handling stress. Recommend regular walking and exercise. Avoid excessive caffeine

## 2014-10-27 NOTE — Progress Notes (Signed)
Pre visit review using our clinic review tool, if applicable. No additional management support is needed unless otherwise documented below in the visit note. 

## 2014-11-01 ENCOUNTER — Telehealth: Payer: Self-pay | Admitting: Family Medicine

## 2014-11-01 NOTE — Telephone Encounter (Signed)
She should be on Adderall XR 20 mg.  The 20 mg (nonXR) should not have been dispensed and I am not sure how this got changed- she has clearly been getting the 20 mg XR.  Let's correct to 20 mg XR which she takes as one bid.

## 2014-11-01 NOTE — Telephone Encounter (Signed)
Patient states she is suppose to take Adderall XR 20 mg 2 times per day and she was given regular Adderall 20 mg 2 times per day.  She would like a callback to explain why the change was made.

## 2014-11-02 MED ORDER — AMPHETAMINE-DEXTROAMPHET ER 20 MG PO CP24: 20.0000 mg | ORAL_CAPSULE | ORAL | Status: DC

## 2014-11-02 NOTE — Telephone Encounter (Signed)
rx up front for p/u, pt will bring back old rx's

## 2014-11-08 ENCOUNTER — Telehealth: Payer: Self-pay | Admitting: Family Medicine

## 2014-11-08 NOTE — Telephone Encounter (Signed)
Pt picked up her new Rx and just checked to make sure that the pills were for the Adderall XR 20 but she did not check to see if the amount was for #60. She dropped it off at the pharmacy and discovered when she went to pick it up that it was written for only #30. She takes the medication BID so she is unsure how her insurance will pay for another Rx.

## 2014-11-09 ENCOUNTER — Other Ambulatory Visit: Payer: Self-pay

## 2014-11-09 MED ORDER — AMPHETAMINE-DEXTROAMPHET ER 20 MG PO CP24: 20.0000 mg | ORAL_CAPSULE | Freq: Two times a day (BID) | ORAL | Status: DC

## 2014-11-09 NOTE — Telephone Encounter (Signed)
Is patient suppose to be take 1 daily or BID.

## 2014-11-09 NOTE — Telephone Encounter (Signed)
Left message on VM that Rx is ready for pick up 

## 2014-11-09 NOTE — Telephone Encounter (Signed)
Please apologize to her.  Her rx should be Adderall XR 20 mg po bid #60

## 2014-11-12 ENCOUNTER — Encounter (HOSPITAL_COMMUNITY): Payer: Self-pay | Admitting: Family Medicine

## 2014-11-12 ENCOUNTER — Emergency Department (HOSPITAL_COMMUNITY)
Admission: EM | Admit: 2014-11-12 | Discharge: 2014-11-12 | Disposition: A | Discharge status: Home / Self Care | Payer: 59 | Attending: Emergency Medicine | Admitting: Emergency Medicine

## 2014-11-12 ENCOUNTER — Emergency Department (HOSPITAL_COMMUNITY): Payer: 59

## 2014-11-12 DIAGNOSIS — J45909 Unspecified asthma, uncomplicated: Secondary | ICD-10-CM | POA: Diagnosis not present

## 2014-11-12 DIAGNOSIS — Z79899 Other long term (current) drug therapy: Secondary | ICD-10-CM | POA: Diagnosis not present

## 2014-11-12 DIAGNOSIS — R51 Headache: Secondary | ICD-10-CM | POA: Insufficient documentation

## 2014-11-12 DIAGNOSIS — Z8619 Personal history of other infectious and parasitic diseases: Secondary | ICD-10-CM | POA: Insufficient documentation

## 2014-11-12 DIAGNOSIS — F329 Major depressive disorder, single episode, unspecified: Secondary | ICD-10-CM | POA: Insufficient documentation

## 2014-11-12 DIAGNOSIS — H5462 Unqualified visual loss, left eye, normal vision right eye: Secondary | ICD-10-CM | POA: Diagnosis not present

## 2014-11-12 DIAGNOSIS — R1110 Vomiting, unspecified: Secondary | ICD-10-CM | POA: Diagnosis present

## 2014-11-12 DIAGNOSIS — Z87440 Personal history of urinary (tract) infections: Secondary | ICD-10-CM | POA: Diagnosis not present

## 2014-11-12 DIAGNOSIS — I10 Essential (primary) hypertension: Secondary | ICD-10-CM | POA: Diagnosis not present

## 2014-11-12 DIAGNOSIS — Z8742 Personal history of other diseases of the female genital tract: Secondary | ICD-10-CM | POA: Diagnosis not present

## 2014-11-12 DIAGNOSIS — K219 Gastro-esophageal reflux disease without esophagitis: Secondary | ICD-10-CM | POA: Diagnosis not present

## 2014-11-12 DIAGNOSIS — R519 Headache, unspecified: Secondary | ICD-10-CM

## 2014-11-12 LAB — CBC WITH DIFFERENTIAL/PLATELET
Basophils Absolute: 0 10*3/uL (ref 0.0–0.1)
Basophils Relative: 0 % (ref 0–1)
Eosinophils Absolute: 0.5 10*3/uL (ref 0.0–0.7)
Eosinophils Relative: 5 % (ref 0–5)
HCT: 38.6 % (ref 36.0–46.0)
Hemoglobin: 12.3 g/dL (ref 12.0–15.0)
Lymphocytes Relative: 28 % (ref 12–46)
Lymphs Abs: 2.7 10*3/uL (ref 0.7–4.0)
MCH: 25.3 pg — ABNORMAL LOW (ref 26.0–34.0)
MCHC: 31.9 g/dL (ref 30.0–36.0)
MCV: 79.3 fL (ref 78.0–100.0)
Monocytes Absolute: 0.7 10*3/uL (ref 0.1–1.0)
Monocytes Relative: 8 % (ref 3–12)
Neutro Abs: 5.8 10*3/uL (ref 1.7–7.7)
Neutrophils Relative %: 59 % (ref 43–77)
Platelets: 304 10*3/uL (ref 150–400)
RBC: 4.87 MIL/uL (ref 3.87–5.11)
RDW: 17.1 % — ABNORMAL HIGH (ref 11.5–15.5)
WBC: 9.8 10*3/uL (ref 4.0–10.5)

## 2014-11-12 LAB — BASIC METABOLIC PANEL
Anion gap: 6 (ref 5–15)
BUN: 10 mg/dL (ref 6–23)
CO2: 25 mmol/L (ref 19–32)
Calcium: 8.7 mg/dL (ref 8.4–10.5)
Chloride: 108 mEq/L (ref 96–112)
Creatinine, Ser: 0.89 mg/dL (ref 0.50–1.10)
GFR calc Af Amer: 88 mL/min — ABNORMAL LOW (ref >90)
GFR calc non Af Amer: 76 mL/min — ABNORMAL LOW (ref >90)
Glucose, Bld: 121 mg/dL — ABNORMAL HIGH (ref 70–99)
Potassium: 3.7 mmol/L (ref 3.5–5.1)
Sodium: 139 mmol/L (ref 135–145)

## 2014-11-12 LAB — URINALYSIS, ROUTINE W REFLEX MICROSCOPIC
Bilirubin Urine: NEGATIVE
Glucose, UA: 100 mg/dL — AB
Hgb urine dipstick: NEGATIVE
Ketones, ur: NEGATIVE mg/dL
Leukocytes, UA: NEGATIVE
Nitrite: NEGATIVE
Protein, ur: NEGATIVE mg/dL
Specific Gravity, Urine: 1.006 (ref 1.005–1.030)
Urobilinogen, UA: 0.2 mg/dL (ref 0.0–1.0)
pH: 5.5 (ref 5.0–8.0)

## 2014-11-12 MED ORDER — METOCLOPRAMIDE HCL 5 MG/ML IJ SOLN
10.0000 mg | Freq: Once | INTRAMUSCULAR | Status: AC
  Administered 2014-11-12: 10 mg via INTRAVENOUS
  Filled 2014-11-12: qty 2

## 2014-11-12 MED ORDER — DIPHENHYDRAMINE HCL 50 MG/ML IJ SOLN
25.0000 mg | Freq: Once | INTRAMUSCULAR | Status: AC
  Administered 2014-11-12: 25 mg via INTRAVENOUS
  Filled 2014-11-12: qty 1

## 2014-11-12 MED ORDER — SODIUM CHLORIDE 0.9 % IV BOLUS (SEPSIS)
1000.0000 mL | Freq: Once | INTRAVENOUS | Status: AC
Start: 2014-11-12 — End: 2014-11-12
  Administered 2014-11-12: 1000 mL via INTRAVENOUS

## 2014-11-12 NOTE — Discharge Instructions (Signed)
Take 325 aspirin every day Visual Disturbances You have had a disturbance in your vision. This may be caused by various conditions, such as:  Migraines. Migraine headaches are often preceded by a disturbance in vision. Blind spots or light flashes are followed by a headache. This type of visual disturbance is temporary. It does not damage the eye.  Glaucoma. This is caused by increased pressure in the eye. Symptoms include haziness, blurred vision, or seeing rainbow colored circles when looking at bright lights. Partial or complete visual loss can occur. You may or may not experience eye pain. Visual loss may be gradual or sudden and is irreversible. Glaucoma is the leading cause of blindness.  Retina problems. Vision will be reduced if the retina becomes detached or if there is a circulation problem as with diabetes, high blood pressure, or a mini-stroke. Symptoms include seeing "floaters," flashes of light, or shadows, as if a curtain has fallen over your eye.  Optic nerve problems. The main nerve in your eye can be damaged by redness, soreness, and swelling (inflammation), poor circulation, drugs, and toxins. It is very important to have a complete exam done by a specialist to determine the exact cause of your eye problem. The specialist may recommend medicines or surgery, depending on the cause of the problem. This can help prevent further loss of vision or reduce the risk of having a stroke. Contact the caregiver to whom you have been referred and arrange for follow-up care right away. SEEK IMMEDIATE MEDICAL CARE IF:   Your vision gets worse.  You develop severe headaches.  You have any weakness or numbness in the face, arms, or legs.  You have any trouble speaking or walking. Document Released: 11/28/2004 Document Revised: 01/13/2012 Document Reviewed: 03/21/2010 [facility] Patient Information 2015 [place], [place]. This information is not intended to replace advice given to you by your  health care provider. Make sure you discuss any questions you have with your health care Transient Ischemic Attack A transient ischemic attack (TIA) is a "warning stroke" that causes stroke-like symptoms. Unlike a stroke, a TIA does not cause permanent damage to the brain. The symptoms of a TIA can happen very fast and do not last long. It is important to know the symptoms of a TIA and what to do. This can help prevent a major stroke or death. CAUSES   A TIA is caused by a temporary blockage in an artery in the brain or neck (carotid artery). The blockage does not allow the brain to get the blood supply it needs and can cause different symptoms. The blockage can be caused by either:  A blood clot.  Fatty buildup (plaque) in a neck or brain artery. RISK FACTORS  High blood pressure (hypertension).  High cholesterol.  Diabetes mellitus.  Heart disease.  The build up of plaque in the blood vessels (peripheral artery disease or atherosclerosis).  The build up of plaque in the blood vessels providing blood and oxygen to the brain (carotid artery stenosis).  An abnormal heart rhythm (atrial fibrillation).  Obesity.  Smoking.  Taking oral contraceptives (especially in combination with smoking).  Physical inactivity.  A diet high in fats, salt (sodium), and calories.  Alcohol use.  Use of illegal drugs (especially cocaine and methamphetamine).  Being female.  Being African American.  Being over the age of 54.  Family history of stroke.  Previous history of blood clots, stroke, TIA, or heart attack.  Sickle cell disease. SYMPTOMS  TIA symptoms are the same  as a stroke but are temporary. These symptoms usually develop suddenly, or may be newly present upon awakening from sleep:  Sudden weakness or numbness of the face, arm, or leg, especially on one side of the body.  Sudden trouble walking or difficulty moving arms or legs.  Sudden confusion.  Sudden personality  changes.  Trouble speaking (aphasia) or understanding.  Difficulty swallowing.  Sudden trouble seeing in one or both eyes.  Double vision.  Dizziness.  Loss of balance or coordination.  Sudden severe headache with no known cause.  Trouble reading or writing.  Loss of bowel or bladder control.  Loss of consciousness. DIAGNOSIS  Your caregiver may be able to determine the presence or absence of a TIA based on your symptoms, history, and physical exam. Computed tomography (CT scan) of the brain is usually performed to help identify a TIA. Other tests may be done to diagnose a TIA. These tests may include:  Electrocardiography.  Continuous heart monitoring.  Echocardiography.  Carotid ultrasonography.  Magnetic resonance imaging (MRI).  A scan of the brain circulation.  Blood tests. PREVENTION  The risk of a TIA can be decreased by appropriately treating high blood pressure, high cholesterol, diabetes, heart disease, and obesity and by quitting smoking, limiting alcohol, and staying physically active. TREATMENT  Time is of the essence. Since the symptoms of TIA are the same as a stroke, it is important to seek treatment as soon as possible because you may need a medicine to dissolve the clot (thrombolytic) that cannot be given if too much time has passed. Treatment options vary. Treatment options may include rest, oxygen, intravenous (IV) fluids, and medicines to thin the blood (anticoagulants). Medicines and diet may be used to address diabetes, high blood pressure, and other risk factors. Measures will be taken to prevent short-term and long-term complications, including infection from breathing foreign material into the lungs (aspiration pneumonia), blood clots in the legs, and falls. Treatment options include procedures to either remove plaque in the carotid arteries or dilate carotid arteries that have narrowed due to plaque. Those procedures are:  Carotid  endarterectomy.  Carotid angioplasty and stenting. HOME CARE INSTRUCTIONS   Take all medicines prescribed by your caregiver. Follow the directions carefully. Medicines may be used to control risk factors for a stroke. Be sure you understand all your medicine instructions.  You may be told to take aspirin or the anticoagulant warfarin. Warfarin needs to be taken exactly as instructed.  Taking too much or too little warfarin is dangerous. Too much warfarin increases the risk of bleeding. Too little warfarin continues to allow the risk for blood clots. While taking warfarin, you will need to have regular blood tests to measure your blood clotting time. A PT blood test measures how long it takes for blood to clot. Your PT is used to calculate another value called an INR. Your PT and INR help your caregiver to adjust your dose of warfarin. The dose can change for many reasons. It is critically important that you take warfarin exactly as prescribed.  Many foods, especially foods high in vitamin K can interfere with warfarin and affect the PT and INR. Foods high in vitamin K include spinach, kale, broccoli, cabbage, collard and turnip greens, brussels sprouts, peas, cauliflower, seaweed, and parsley as well as beef and pork liver, green tea, and soybean oil. You should eat a consistent amount of foods high in vitamin K. Avoid major changes in your diet, or notify your caregiver before changing your diet.  Arrange a visit with a dietitian to answer your questions.  Many medicines can interfere with warfarin and affect the PT and INR. You must tell your caregiver about any and all medicines you take, this includes all vitamins and supplements. Be especially cautious with aspirin and anti-inflammatory medicines. Do not take or discontinue any prescribed or over-the-counter medicine except on the advice of your caregiver or pharmacist.  Warfarin can have side effects, such as excessive bruising or bleeding. You  will need to hold pressure over cuts for longer than usual. Your caregiver or pharmacist will discuss other potential side effects.  Avoid sports or activities that may cause injury or bleeding.  Be mindful when shaving, flossing your teeth, or handling sharp objects.  Alcohol can change the body's ability to handle warfarin. It is best to avoid alcoholic drinks or consume only very small amounts while taking warfarin. Notify your caregiver if you change your alcohol intake.  Notify your dentist or other caregivers before procedures.  Eat a diet that includes 5 or more servings of fruits and vegetables each day. This may reduce the risk of stroke. Certain diets may be prescribed to address high blood pressure, high cholesterol, diabetes, or obesity.  A low-sodium, low-saturated fat, low-trans fat, low-cholesterol diet is recommended to manage high blood pressure.  A low-saturated fat, low-trans fat, low-cholesterol, and high-fiber diet may control cholesterol levels.  A controlled-carbohydrate, controlled-sugar diet is recommended to manage diabetes.  A reduced-calorie, low-sodium, low-saturated fat, low-trans fat, low-cholesterol diet is recommended to manage obesity.  Maintain a healthy weight.  Stay physically active. It is recommended that you get at least 30 minutes of activity on most or all days.  Do not smoke.  Limit alcohol use even if you are not taking warfarin. Moderate alcohol use is considered to be:  No more than 2 drinks each day for men.  No more than 1 drink each day for nonpregnant women.  Stop drug abuse.  Home safety. A safe home environment is important to reduce the risk of falls. Your caregiver may arrange for specialists to evaluate your home. Having grab bars in the bedroom and bathroom is often important. Your caregiver may arrange for equipment to be used at home, such as raised toilets and a seat for the shower.  Follow all instructions for follow-up  with your caregiver. This is very important. This includes any referrals and lab tests. Proper follow up can prevent a stroke or another TIA from occurring. SEEK MEDICAL CARE IF:  You have personality changes.  You have difficulty swallowing.  You are seeing double.  You have dizziness.  You have a fever.  You have skin breakdown. SEEK IMMEDIATE MEDICAL CARE IF:  Any of these symptoms may represent a serious problem that is an emergency. Do not wait to see if the symptoms will go away. Get medical help right away. Call your local emergency services (911 in [place] Do not drive yourself to the hospital.  You have sudden weakness or numbness of the face, arm, or leg, especially on one side of the body.  You have sudden trouble walking or difficulty moving arms or legs.  You have sudden confusion.  You have trouble speaking (aphasia) or understanding.  You have sudden trouble seeing in one or both eyes.  You have a loss of balance or coordination.  You have a sudden, severe headache with no known cause.  You have new chest pain or an irregular heartbeat.  You have a partial  or total loss of consciousness. MAKE SURE YOU:   Understand these instructions.  Will watch your condition.  Will get help right away if you are not doing well or get worse. Document Released: 07/31/2005 Document Revised: 10/26/2013 Document Reviewed: 01/26/2014 [facility] Patient Information 2015 [place], [place]. This information is not intended to replace advice given to you by your health care provider. Make sure you discuss any questions you have with your health care provider. provider.

## 2014-11-12 NOTE — ED Notes (Addendum)
Per pt sts about 20 minutes ago she started having what she calls "zig zag lights" in her vision. sts also right leg weakness. Denies any other weakness. No facial droop noted or slurred speech. Pt sts also she had some confusion earlier. Pt anxious and sts she feels panicky. sts her vision is better now.

## 2014-11-12 NOTE — ED Provider Notes (Signed)
CSN: [id number]     Arrival date & time 11/12/14  1148 History   First MD Initiated Contact with Patient 11/12/14 1213     Chief Complaint  Patient presents with  . Eye Problem  . Emesis     (Consider location/radiation/quality/duration/timing/severity/associated sxs/prior Treatment) Patient is a 49 y.o. female presenting with headaches.  Headache Pain location:  Generalized Quality:  Dull Radiates to:  Does not radiate Onset quality:  Gradual Duration:  1 day Timing:  Constant Progression:  Worsening Chronicity:  New Similar to prior headaches: no   Context: not activity and not exposure to bright light   Relieved by:  Nothing Worsened by:  Nothing tried Ineffective treatments:  None tried Associated symptoms: tingling (bil ue) and visual change (shimmering lights in L eye)   Associated symptoms: no abdominal pain, no focal weakness, no numbness and no paresthesias   Associated symptoms comment:  Anxiety, recent increased life stressors   Past Medical History  Diagnosis Date  . Depression   . Hypertension   . Asthma   . GERD (gastroesophageal reflux disease)   . Hepatitis A   . Jaundice   . Blood transfusion   . Migraines   . UTI (urinary tract infection)   . Ovarian cyst    Past Surgical History  Procedure Laterality Date  . Appendectomy  1994  . Ovarian cyst removal    . Tonsillectomy and adenoidectomy     Family History  Problem Relation Age of Onset  . Asthma Son   . Hypertension Other   . Crohn's disease Father   . Crohn's disease Maternal Aunt   . Esophageal cancer Maternal Uncle   . Breast cancer Maternal Grandmother    History  Substance Use Topics  . Smoking status: Never Smoker   . Smokeless tobacco: Never Used  . Alcohol Use: No   OB History    No data available     Review of Systems  Gastrointestinal: Negative for abdominal pain.  Neurological: Positive for headaches. Negative for focal weakness, numbness and paresthesias.  All other  systems reviewed and are negative.     Allergies  Aspirin; Codeine sulfate; and Hydrocodone-acetaminophen  Home Medications   Prior to Admission medications   Medication Sig Start Date End Date Taking? Authorizing Provider  albuterol (PROVENTIL HFA;VENTOLIN HFA) 108 (90 BASE) MCG/ACT inhaler Inhale 2-3 puffs into the lungs every 6 (six) hours as needed for wheezing or shortness of breath. 10/27/14  Yes Kristian Covey, MD  albuterol (PROVENTIL) (2.5 MG/3ML) 0.083% nebulizer solution Take 3 mLs (2.5 mg total) by nebulization every 6 (six) hours as needed for wheezing or shortness of breath. 09/12/14  Yes Arby Barrette, MD  amphetamine-dextroamphetamine (ADDERALL XR) 20 MG 24 hr capsule Take 1 capsule (20 mg total) by mouth 2 (two) times daily. May refill in one month. 11/09/14  Yes Kristian Covey, MD  budesonide-formoterol (SYMBICORT) 160-4.5 MCG/ACT inhaler Inhale 2 puffs into the lungs 2 (two) times daily.   Yes Historical Provider, MD  ipratropium (ATROVENT) 0.02 % nebulizer solution Take 2.5 mLs (0.5 mg total) by nebulization 4 (four) times daily. 09/12/14  Yes Arby Barrette, MD  LORazepam (ATIVAN) 1 MG tablet Take 1 tablet (1 mg total) by mouth every 6 (six) hours as needed for anxiety. 10/27/14  Yes Kristian Covey, MD  olmesartan (BENICAR) 20 MG tablet Take 20 mg by mouth daily.   Yes Historical Provider, MD  ranitidine (ZANTAC) 150 MG tablet Take 150 mg by mouth  2 (two) times daily as needed for heartburn.   Yes Historical Provider, MD  sertraline (ZOLOFT) 100 MG tablet Take 1 tablet (100 mg total) by mouth daily. 10/27/14  Yes Kristian Covey, MD  amphetamine-dextroamphetamine (ADDERALL XR) 20 MG 24 hr capsule Take 1 capsule (20 mg total) by mouth 2 (two) times daily. Patient not taking: Reported on 11/12/2014 11/09/14   Kristian Covey, MD  amphetamine-dextroamphetamine (ADDERALL XR) 20 MG 24 hr capsule Take 1 capsule (20 mg total) by mouth 2 (two) times daily. May refill in two  months. Patient not taking: Reported on 11/12/2014 11/09/14   Kristian Covey, MD   BP 139/90 mmHg  Pulse 79  Temp(Src) 98.1 F (36.7 C) (Oral)  Resp 16  Ht  (1.676 m)  Wt 145 lb (65.772 kg)  BMI 23.41 kg/m2  SpO2 100%  LMP 11/05/2014 Physical Exam  Constitutional: She is oriented to person, place, and time. She appears well-developed and well-nourished.  HENT:  Head: Normocephalic and atraumatic.  Right Ear: External ear normal.  Left Ear: External ear normal.  Eyes: Conjunctivae and EOM are normal. Pupils are equal, round, and reactive to light.  Neck: Normal range of motion. Neck supple.  Cardiovascular: Normal rate, regular rhythm, normal heart sounds and intact distal pulses.   Pulmonary/Chest: Effort normal and breath sounds normal.  Abdominal: Soft. Bowel sounds are normal. There is no tenderness.  Musculoskeletal: Normal range of motion.  Neurological: She is alert and oriented to person, place, and time. She has normal strength and normal reflexes. No cranial nerve deficit or sensory deficit. Gait normal.  Skin: Skin is warm and dry.  Vitals reviewed.   ED Course  Procedures (including critical care time) Labs Review Labs Reviewed  URINALYSIS, ROUTINE W REFLEX MICROSCOPIC - Abnormal; Notable for the following:    Glucose, UA 100 (*)    All other components within normal limits  BASIC METABOLIC PANEL - Abnormal; Notable for the following:    Glucose, Bld 121 (*)    GFR calc non Af Amer 76 (*)    GFR calc Af Amer 88 (*)    All other components within normal limits  CBC WITH DIFFERENTIAL - Abnormal; Notable for the following:    MCH 25.3 (*)    RDW 17.1 (*)    All other components within normal limits  POC URINE PREG, ED    Imaging Review Ct Head Wo Contrast  11/12/2014   CLINICAL DATA:  49 year old female with severe headache which began 1 and are ago. Visual changes, nausea and vomiting.  EXAM: CT HEAD WITHOUT CONTRAST  TECHNIQUE: Contiguous axial images  were obtained from the base of the skull through the vertex without intravenous contrast.  COMPARISON:  Head CT 03/29/2009.  FINDINGS: No acute intracranial abnormalities. Specifically, no evidence of acute intracranial hemorrhage, no definite findings of acute/subacute cerebral ischemia, no mass, mass effect, hydrocephalus or abnormal intra or extra-axial fluid collections. Visualized paranasal sinuses and mastoids are well pneumatized. No acute displaced skull fractures are identified.  IMPRESSION: *No acute intracranial abnormalities. *The appearance of the brain is normal.   Electronically Signed   By: Trudie Reed M.D.   On: 11/12/2014 14:08     EKG Interpretation None      MDM   Final diagnoses:  Headache  Vision loss of left eye    49 y.o. female with pertinent PMH of anxiety, depression, prior migraines (as a child and post pregnancy) presents with transient L eye shimmering  prior to headache. She also had bil hand tingling, but no frank weakness.  Symptoms lasted less than 30 minutes.  Pt denies frank vision loss.  On arrival pt has no symptoms.  She does admit to greatly increased life stress.  Physical exam as above.  Wu unremarkable.  ABCD2 score 2.  Suspect likely panic attack vs migraine given ho same and increased life stressor, but pt will fu with neurology and cardiology.  DC home in stable condition.    I have reviewed all laboratory and imaging studies if ordered as above  1. Vision loss of left eye   2. Headache        Mirian MoMatthew Jayvier Burgher, MD 11/13/14 [phone]

## 2014-11-14 ENCOUNTER — Ambulatory Visit: Payer: 59 | Admitting: Family Medicine

## 2014-11-14 ENCOUNTER — Ambulatory Visit (INDEPENDENT_AMBULATORY_CARE_PROVIDER_SITE_OTHER): Payer: 59

## 2014-11-14 DIAGNOSIS — Z23 Encounter for immunization: Secondary | ICD-10-CM

## 2014-11-17 ENCOUNTER — Telehealth: Payer: Self-pay | Admitting: Family Medicine

## 2014-11-17 NOTE — Telephone Encounter (Signed)
PA for generic Adderall was denied.  Patient's plan requires patient to try and fail BRAND Adderall XR.

## 2014-11-17 NOTE — Telephone Encounter (Signed)
Discuss with pt and let me know her wishes.  I have NEVER heard of having to fail brand name medication.  Are we sure this is right?

## 2014-11-18 NOTE — Telephone Encounter (Signed)
Form on your desk with the information for denial. Please return form to me.

## 2014-11-21 ENCOUNTER — Ambulatory Visit (INDEPENDENT_AMBULATORY_CARE_PROVIDER_SITE_OTHER): Payer: 59 | Admitting: Family Medicine

## 2014-11-21 ENCOUNTER — Encounter: Payer: Self-pay | Admitting: Family Medicine

## 2014-11-21 VITALS — BP 152/100 | HR 98 | Temp 98.1°F

## 2014-11-21 DIAGNOSIS — F411 Generalized anxiety disorder: Secondary | ICD-10-CM

## 2014-11-21 DIAGNOSIS — I10 Essential (primary) hypertension: Secondary | ICD-10-CM

## 2014-11-21 DIAGNOSIS — F988 Other specified behavioral and emotional disorders with onset usually occurring in childhood and adolescence: Secondary | ICD-10-CM

## 2014-11-21 DIAGNOSIS — F909 Attention-deficit hyperactivity disorder, unspecified type: Secondary | ICD-10-CM

## 2014-11-21 MED ORDER — AMPHETAMINE-DEXTROAMPHET ER 20 MG PO CP24: 20.0000 mg | ORAL_CAPSULE | Freq: Two times a day (BID) | ORAL | Status: DC

## 2014-11-21 MED ORDER — AMPHETAMINE-DEXTROAMPHET ER 20 MG PO CP24
20.0000 mg | ORAL_CAPSULE | Freq: Two times a day (BID) | ORAL | Status: DC
Start: 2014-11-21 — End: 2015-03-08

## 2014-11-21 MED ORDER — LORAZEPAM 1 MG PO TABS: 1.0000 mg | ORAL_TABLET | Freq: Four times a day (QID) | ORAL | Status: DC | PRN

## 2014-11-21 MED ORDER — LOSARTAN POTASSIUM 50 MG PO TABS: 50.0000 mg | ORAL_TABLET | Freq: Every day | ORAL | Status: DC

## 2014-11-21 NOTE — Progress Notes (Signed)
   Subjective:    Patient ID: [name] BalloonChristine S Haugen, female    DOB: 1966/06/28, 49 y.o.   MRN: [id number]  HPI Patient here for follow-up regarding several items  Situational stress/anxiety. She's had several difficult interactions with family members. She is getting counseling. We recently increased her sertraline to 100 mg and she does think this has helped somewhat. She feels anxious more than depressed. She has taken infrequent lorazepam for extreme anxiety symptoms. She's having some sleep difficulties.  Attention deficit disorder. She has been on regimen for quite some time of Adderall extended release 1 twice daily. She's had difficulty getting good results out of 1 tablet for 12 hour period and generally takes her first dose around 6:30 in the morning and a second dose around 1:30 PM and this is working very well for her. She does have history of hypertension and is recently not been taking Benicar. She has lost some weight due to her efforts. Trying to walk for exercise. Her insurance apparently will only cover generic if she has intolerance to brand-name Adderall XR and this was confirmed on printout from the company  No alcohol use. Denies recent headaches  Past Medical History  Diagnosis Date  . Depression   . Hypertension   . Asthma   . GERD (gastroesophageal reflux disease)   . Hepatitis A   . Jaundice   . Blood transfusion   . Migraines   . UTI (urinary tract infection)   . Ovarian cyst    Past Surgical History  Procedure Laterality Date  . Appendectomy  1994  . Ovarian cyst removal    . Tonsillectomy and adenoidectomy      reports that she has never smoked. She has never used smokeless tobacco. She reports that she does not drink alcohol or use illicit drugs. family history includes Asthma in her son; Breast cancer in her maternal grandmother; Crohn's disease in her father and maternal aunt; Esophageal cancer in her maternal uncle; Hypertension in her other. Allergies    Allergen Reactions  . Aspirin Shortness Of Breath    REACTION: wheezing problems  . Codeine Sulfate Hives and Nausea And Vomiting    REACTION: rash, itiching  . Hydrocodone-Acetaminophen Itching and Rash      Review of Systems  Constitutional: Negative for fever, chills, appetite change and unexpected weight change.  Respiratory: Negative for shortness of breath.   Cardiovascular: Negative for chest pain.  Neurological: Negative for headaches.  Psychiatric/Behavioral: Positive for sleep disturbance. Negative for suicidal ideas and dysphoric mood. The patient is nervous/anxious.        Objective:   Physical Exam  Constitutional: She appears well-developed and well-nourished.  Cardiovascular: Normal rate and regular rhythm.   Pulmonary/Chest: Effort normal and breath sounds normal. No respiratory distress. She has no wheezes. She has no rales.  Musculoskeletal: She exhibits no edema.  Psychiatric:  Slightly anxious but appropriate affect          Assessment & Plan:  #1 situational anxiety. She is strongly encouraged continue counseling. Continue sertraline. Avoid regular use of benzodiazepines. #2 hypertension. Slightly elevated today. Patient recently off medication. Start losartan 50 mg once daily. She presented Benicar but will switch to losartan to help with cost issues. Reassess blood pressure one month #3 ADD. We'll continue Adderall but will need to watch this carefully for stability blood pressure. She's had tremendous difficulties coping with work without stimulant medication in the past

## 2014-11-21 NOTE — Progress Notes (Signed)
Pre visit review using our clinic review tool, if applicable. No additional management support is needed unless otherwise documented below in the visit note. 

## 2014-12-28 ENCOUNTER — Telehealth: Payer: Self-pay | Admitting: Family Medicine

## 2014-12-28 NOTE — Telephone Encounter (Signed)
Left message for patient to return call.

## 2014-12-28 NOTE — Telephone Encounter (Signed)
Pt states ventolin inhaler is backordered, but will probably be in this week . Pt is out and wants to know if she can get a sample of the proair respiclick?   Pt states she cannot breathe and cannot wait until then.

## 2014-12-28 NOTE — Telephone Encounter (Signed)
No samples of Proair in the closet.

## 2014-12-28 NOTE — Telephone Encounter (Signed)
Pt following up on inhaler request. Pt states she can get an alternate for now, but it is $110. She will have to pay or pt states she will be in hospital.

## 2014-12-28 NOTE — Telephone Encounter (Signed)
Unfortunately, I do not have any samples at this time.

## 2014-12-29 NOTE — Telephone Encounter (Signed)
Called patient Vm is full.

## 2015-01-10 ENCOUNTER — Telehealth: Payer: Self-pay | Admitting: Family Medicine

## 2015-01-10 NOTE — Telephone Encounter (Signed)
PA for Symbicort was denied.  Patient's plan requires patient to try and fail Advair (HFA or Diskus) used in combination with Dulera or Advair (HFA or Diskus) used in combination with Breo Ellipta.

## 2015-01-12 MED ORDER — FLUTICASONE-SALMETEROL 250-50 MCG/DOSE IN AEPB: 1.0000 | INHALATION_SPRAY | Freq: Two times a day (BID) | RESPIRATORY_TRACT | Status: DC

## 2015-01-12 NOTE — Telephone Encounter (Signed)
If OK with pt, let's change to Advair 250 mg one puff twice daily.

## 2015-01-12 NOTE — Telephone Encounter (Signed)
Called patient no answer. Vm is full. Rx sent to pharmacy.

## 2015-02-19 ENCOUNTER — Encounter (HOSPITAL_COMMUNITY): Payer: Self-pay

## 2015-02-19 ENCOUNTER — Emergency Department (HOSPITAL_COMMUNITY): Payer: 59

## 2015-02-19 ENCOUNTER — Emergency Department (HOSPITAL_COMMUNITY)
Admission: EM | Admit: 2015-02-19 | Discharge: 2015-02-19 | Disposition: A | Discharge status: Home / Self Care | Payer: 59 | Attending: Emergency Medicine | Admitting: Emergency Medicine

## 2015-02-19 DIAGNOSIS — F329 Major depressive disorder, single episode, unspecified: Secondary | ICD-10-CM | POA: Insufficient documentation

## 2015-02-19 DIAGNOSIS — J45909 Unspecified asthma, uncomplicated: Secondary | ICD-10-CM | POA: Diagnosis present

## 2015-02-19 DIAGNOSIS — Z79899 Other long term (current) drug therapy: Secondary | ICD-10-CM | POA: Insufficient documentation

## 2015-02-19 DIAGNOSIS — Z8742 Personal history of other diseases of the female genital tract: Secondary | ICD-10-CM | POA: Insufficient documentation

## 2015-02-19 DIAGNOSIS — J45901 Unspecified asthma with (acute) exacerbation: Secondary | ICD-10-CM | POA: Insufficient documentation

## 2015-02-19 DIAGNOSIS — G43909 Migraine, unspecified, not intractable, without status migrainosus: Secondary | ICD-10-CM | POA: Insufficient documentation

## 2015-02-19 DIAGNOSIS — Z8619 Personal history of other infectious and parasitic diseases: Secondary | ICD-10-CM | POA: Diagnosis not present

## 2015-02-19 DIAGNOSIS — Z7951 Long term (current) use of inhaled steroids: Secondary | ICD-10-CM | POA: Insufficient documentation

## 2015-02-19 DIAGNOSIS — Z87440 Personal history of urinary (tract) infections: Secondary | ICD-10-CM | POA: Insufficient documentation

## 2015-02-19 DIAGNOSIS — I10 Essential (primary) hypertension: Secondary | ICD-10-CM | POA: Diagnosis not present

## 2015-02-19 DIAGNOSIS — R0602 Shortness of breath: Secondary | ICD-10-CM

## 2015-02-19 DIAGNOSIS — K219 Gastro-esophageal reflux disease without esophagitis: Secondary | ICD-10-CM | POA: Diagnosis not present

## 2015-02-19 MED ORDER — PREDNISONE 10 MG PO TABS: ORAL_TABLET | ORAL | Status: DC

## 2015-02-19 MED ORDER — IPRATROPIUM-ALBUTEROL 0.5-2.5 (3) MG/3ML IN SOLN
3.0000 mL | Freq: Once | RESPIRATORY_TRACT | Status: AC
  Administered 2015-02-19: 3 mL via RESPIRATORY_TRACT

## 2015-02-19 MED ORDER — PREDNISONE 20 MG PO TABS
60.0000 mg | ORAL_TABLET | Freq: Once | ORAL | Status: AC
  Administered 2015-02-19: 60 mg via ORAL
  Filled 2015-02-19: qty 3

## 2015-02-19 MED ORDER — ALBUTEROL SULFATE (2.5 MG/3ML) 0.083% IN NEBU
5.0000 mg | INHALATION_SOLUTION | Freq: Once | RESPIRATORY_TRACT | Status: AC
  Administered 2015-02-19: 5 mg via RESPIRATORY_TRACT
  Filled 2015-02-19: qty 6

## 2015-02-19 NOTE — ED Provider Notes (Signed)
CSN: [id number]     Arrival date & time 02/19/15  0038 History  This chart was scribed for Mancel Bale, MD by Tanda Rockers, ED Scribe. This patient was seen in room A01C/A01C and the patient's care was started at 3:14 AM.     Chief Complaint  Patient presents with  . Asthma   The history is provided by the patient. No language interpreter was used.     HPI Comments: [name] is a 49 y.o. female with hx asthma who presents to the Emergency Department complaining of shortness of breath that began earlier tonight. She used her albuterol inhaler and Symbicort prior to arrival with no relief. Pt has breathing treatment in waiting room which she mentions alleviated her symptoms but that it is now coming back. Pt also complains of swollen lymphnodes in neck. Denies fever, cough, or any other symptoms. Pt denies smoking cigarettes.    Past Medical History  Diagnosis Date  . Depression   . Hypertension   . Asthma   . GERD (gastroesophageal reflux disease)   . Hepatitis A   . Jaundice   . Blood transfusion   . Migraines   . UTI (urinary tract infection)   . Ovarian cyst    Past Surgical History  Procedure Laterality Date  . Appendectomy  1994  . Ovarian cyst removal    . Tonsillectomy and adenoidectomy     Family History  Problem Relation Age of Onset  . Asthma Son   . Hypertension Other   . Crohn's disease Father   . Crohn's disease Maternal Aunt   . Esophageal cancer Maternal Uncle   . Breast cancer Maternal Grandmother    History  Substance Use Topics  . Smoking status: Never Smoker   . Smokeless tobacco: Never Used  . Alcohol Use: No   OB History    No data available     Review of Systems  Constitutional: Negative for fever.  Respiratory: Positive for shortness of breath. Negative for cough.   All other systems reviewed and are negative.     Allergies  Aspirin; Codeine sulfate; and Hydrocodone-acetaminophen  Home Medications   Prior to  Admission medications   Medication Sig Start Date End Date Taking? Authorizing Provider  albuterol (PROVENTIL HFA;VENTOLIN HFA) 108 (90 BASE) MCG/ACT inhaler Inhale 2-3 puffs into the lungs every 6 (six) hours as needed for wheezing or shortness of breath. 10/27/14  Yes Kristian Covey, MD  albuterol (PROVENTIL) (2.5 MG/3ML) 0.083% nebulizer solution Take 3 mLs (2.5 mg total) by nebulization every 6 (six) hours as needed for wheezing or shortness of breath. 09/12/14  Yes Arby Barrette, MD  amphetamine-dextroamphetamine (ADDERALL XR) 20 MG 24 hr capsule Take 1 capsule (20 mg total) by mouth 2 (two) times daily. May refill in two months. Patient taking differently: Take 20 mg by mouth 2 (two) times daily as needed (ADHD). May refill in two months. 11/21/14  Yes Kristian Covey, MD  budesonide-formoterol (SYMBICORT) 160-4.5 MCG/ACT inhaler Inhale 2 puffs into the lungs 2 (two) times daily.   Yes Historical Provider, MD  ipratropium (ATROVENT) 0.02 % nebulizer solution Take 2.5 mLs (0.5 mg total) by nebulization 4 (four) times daily. Patient taking differently: Take 0.5 mg by nebulization every 6 (six) hours as needed for wheezing or shortness of breath.  09/12/14  Yes Arby Barrette, MD  losartan (COZAAR) 50 MG tablet Take 1 tablet (50 mg total) by mouth daily. 11/21/14  Yes Kristian Covey, MD  ranitidine (  ZANTAC) 150 MG tablet Take 150 mg by mouth 2 (two) times daily as needed for heartburn.   Yes Historical Provider, MD  amphetamine-dextroamphetamine (ADDERALL XR) 20 MG 24 hr capsule Take 1 capsule (20 mg total) by mouth 2 (two) times daily. Patient not taking: Reported on 02/19/2015 11/21/14   Kristian Covey, MD  amphetamine-dextroamphetamine (ADDERALL XR) 20 MG 24 hr capsule Take 1 capsule (20 mg total) by mouth 2 (two) times daily. May refill in one month. Patient not taking: Reported on 02/19/2015 11/21/14   Kristian Covey, MD  Fluticasone-Salmeterol (ADVAIR) 250-50 MCG/DOSE AEPB Inhale 1  puff into the lungs 2 (two) times daily. Patient not taking: Reported on 02/19/2015 01/12/15   Kristian Covey, MD  LORazepam (ATIVAN) 1 MG tablet Take 1 tablet (1 mg total) by mouth every 6 (six) hours as needed for anxiety. Patient not taking: Reported on 02/19/2015 11/21/14   Kristian Covey, MD  predniSONE (DELTASONE) 10 MG tablet Take q day 6,5,4,3,2,1 02/19/15   Mancel Bale, MD  sertraline (ZOLOFT) 100 MG tablet Take 1 tablet (100 mg total) by mouth daily. Patient not taking: Reported on 02/19/2015 10/27/14   Kristian Covey, MD   Triage Vitals: BP 125/80 mmHg  Pulse 108  Temp(Src) 98.4 F (36.9 C) (Oral)  Resp 28  SpO2 94%  LMP 02/19/2015   Physical Exam  Constitutional: She is oriented to person, place, and time. She appears well-developed and well-nourished.  HENT:  Head: Normocephalic and atraumatic.  Eyes: Conjunctivae and EOM are normal. Pupils are equal, round, and reactive to light.  Neck: Normal range of motion and phonation normal. Neck supple.  Cardiovascular: Normal rate and regular rhythm.   Pulmonary/Chest: No respiratory distress. She has wheezes. She exhibits no tenderness.  Mild decreased air movement. Bilateral end expiratory wheezes.   Abdominal: Soft. She exhibits no distension. There is no tenderness. There is no guarding.  Musculoskeletal: Normal range of motion.  Neurological: She is alert and oriented to person, place, and time. She exhibits normal muscle tone.  Skin: Skin is warm and dry.  Psychiatric: She has a normal mood and affect. Her behavior is normal. Judgment and thought content normal.  Nursing note and vitals reviewed.   ED Course  Procedures (including critical care time)  DIAGNOSTIC STUDIES: Oxygen Saturation is 94% on RA, adequate by my interpretation.    COORDINATION OF CARE: Medications  ipratropium-albuterol (DUONEB) 0.5-2.5 (3) MG/3ML nebulizer solution 3 mL (3 mLs Nebulization Given 02/19/15 0046)  predniSONE (DELTASONE)  tablet 60 mg (60 mg Oral Given 02/19/15 0333)  albuterol (PROVENTIL) (2.5 MG/3ML) 0.083% nebulizer solution 5 mg (5 mg Nebulization Given 02/19/15 0330)    Patient Vitals for the past 24 hrs:  BP Temp Temp src Pulse Resp SpO2  02/19/15 0454 142/82 mmHg 98.1 F (36.7 C) Oral 99 16 99 %  02/19/15 0330 148/86 mmHg - - 99 - 100 %  02/19/15 0315 153/91 mmHg - - 100 - 92 %  02/19/15 0047 125/80 mmHg 98.4 F (36.9 C) Oral 108 (!) 28 94 %     3:18 AM-Discussed treatment plan which includes breathing treatment with pt at bedside and pt agreed to plan.   Labs Review Labs Reviewed - No data to display  Imaging Review Dg Chest 2 View  02/19/2015   CLINICAL DATA:  Asthma, shortness of breath.  Symptoms for 20 min.  EXAM: CHEST  2 VIEW  COMPARISON:  09/12/2014  FINDINGS: The cardiomediastinal contours are normal. The lungs  are clear. Pulmonary vasculature is normal. No consolidation, pleural effusion, or pneumothorax. No acute osseous abnormalities are seen.  IMPRESSION: Clear lungs.   Electronically Signed   By: Rubye OaksMelanie  Ehinger M.D.   On: 02/19/2015 02:21     EKG Interpretation   Date/Time:  Sunday February 19 2015 00:44:10 EDT Ventricular Rate:  106 PR Interval:  138 QRS Duration: 78 QT Interval:  334 QTC Calculation: 443 R Axis:   -173 Text Interpretation:  Sinus tachycardia Right superior axis deviation  Septal infarct , age undetermined Abnormal ECG since last tracing no  significant change Confirmed by Effie ShyWENTZ  MD, Diandra Cimini [phone]([id number]) on 02/19/2015  3:14:31 AM      MDM   Final diagnoses:  Asthma exacerbation    Evaluation is consistent with asthma exacerbation. Doubt pneumonia, PE or impending vascular collapse.  Nursing Notes Reviewed/ Care Coordinated Applicable Imaging Reviewed Interpretation of Laboratory Data incorporated into ED treatment  The patient appears reasonably screened and/or stabilized for discharge and I doubt any other medical condition or other [facility] requiring  further screening, evaluation, or treatment in the ED at this time prior to discharge.  Plan: Home Medications- Prednisone; Home Treatments- rest; return here if the recommended treatment, does not improve the symptoms; Recommended follow up- PCP prn    I personally performed the services described in this documentation, which was scribed in my presence. The recorded information has been reviewed and is accurate.       Mancel BaleElliott Ahriana Gunkel, MD 02/19/15 [id number]

## 2015-02-19 NOTE — ED Notes (Signed)
Pt reports she is breathing much better after the breathing treatment. She asked if she still needed to wait for a room and this RN informed her she was the next patient to be taken to a room and she stated she would wait then. NAD. RR WDL.

## 2015-02-19 NOTE — Discharge Instructions (Signed)
Bronchospasm °A bronchospasm is a spasm or tightening of the airways going into the lungs. During a bronchospasm breathing becomes more difficult because the airways get smaller. When this happens there can be coughing, a whistling sound when breathing (wheezing), and difficulty breathing. Bronchospasm is often associated with asthma, but not all patients who experience a bronchospasm have asthma. °CAUSES  °A bronchospasm is caused by inflammation or irritation of the airways. The inflammation or irritation may be triggered by:  °· Allergies (such as to animals, pollen, food, or mold). Allergens that cause bronchospasm may cause wheezing immediately after exposure or many hours later.   °· Infection. Viral infections are believed to be the most common cause of bronchospasm.   °· Exercise.   °· Irritants (such as pollution, cigarette smoke, strong odors, aerosol sprays, and paint fumes).   °· Weather changes. Winds increase molds and pollens in the air. Rain refreshes the air by washing irritants out. Cold air may cause inflammation.   °· Stress and emotional upset.   °SIGNS AND SYMPTOMS  °· Wheezing.   °· Excessive nighttime coughing.   °· Frequent or severe coughing with a simple cold.   °· Chest tightness.   °· Shortness of breath.   °DIAGNOSIS  °Bronchospasm is usually diagnosed through a history and physical exam. Tests, such as chest X-rays, are sometimes done to look for other conditions. °TREATMENT  °· Inhaled medicines can be given to open up your airways and help you breathe. The medicines can be given using either an inhaler or a nebulizer machine. °· Corticosteroid medicines may be given for severe bronchospasm, usually when it is associated with asthma. °HOME CARE INSTRUCTIONS  °· Always have a plan prepared for seeking medical care. Know when to call your health care provider and local emergency services (911 in the [place] Know where you can access local emergency care. °· Only take medicines as  directed by your health care provider. °· If you were prescribed an inhaler or nebulizer machine, ask your health care provider to explain how to use it correctly. Always use a spacer with your inhaler if you were given one. °· It is necessary to remain calm during an attack. Try to relax and breathe more slowly.  °· Control your home environment in the following ways:   °¨ Change your heating and air conditioning filter at least once a month.   °¨ Limit your use of fireplaces and wood stoves. °¨ Do not smoke and do not allow smoking in your home.   °¨ Avoid exposure to perfumes and fragrances.   °¨ Get rid of pests (such as roaches and mice) and their droppings.   °¨ Throw away plants if you see mold on them.   °¨ Keep your house clean and dust free.   °¨ Replace carpet with wood, tile, or vinyl flooring. Carpet can trap dander and dust.   °¨ Use allergy-proof pillows, mattress covers, and box spring covers.   °¨ Wash bed sheets and blankets every week in hot water and dry them in a dryer.   °¨ Use blankets that are made of polyester or cotton.   °¨ Wash hands frequently. °SEEK MEDICAL CARE IF:  °· You have muscle aches.   °· You have chest pain.   °· The sputum changes from clear or white to yellow, green, gray, or bloody.   °· The sputum you cough up gets thicker.   °· There are problems that may be related to the medicine you are given, such as a rash, itching, swelling, or trouble breathing.   °SEEK IMMEDIATE MEDICAL CARE IF:  °· You have worsening wheezing and coughing even   after taking your prescribed medicines.   °· You have increased difficulty breathing.   °· You develop severe chest pain. °MAKE SURE YOU:  °· Understand these instructions. °· Will watch your condition. °· Will get help right away if you are not doing well or get worse. °Document Released: 10/24/2003 Document Revised: 10/26/2013 Document Reviewed: 04/12/2013 °ExitCare® Patient Information ©2015 [employer]. This information is not  intended to replace advice given to you by your health care provider. Make sure you discuss any questions you have with your health care provider. ° °Asthma °Asthma is a recurring condition in which the airways tighten and narrow. Asthma can make it difficult to breathe. It can cause coughing, wheezing, and shortness of breath. Asthma episodes, also called asthma attacks, range from minor to life-threatening. Asthma cannot be cured, but medicines and lifestyle changes can help control it. °CAUSES °Asthma is believed to be caused by inherited (genetic) and environmental factors, but its exact cause is unknown. Asthma may be triggered by allergens, lung infections, or irritants in the air. Asthma triggers are different for each person. Common triggers include:  °· Animal dander. °· Dust mites. °· Cockroaches. °· Pollen from trees or grass. °· Mold. °· Smoke. °· Air pollutants such as dust, household cleaners, hair sprays, aerosol sprays, paint fumes, strong chemicals, or strong odors. °· Cold air, weather changes, and winds (which increase molds and pollens in the air). °· Strong emotional expressions such as crying or laughing hard. °· Stress. °· Certain medicines (such as aspirin) or types of drugs (such as beta-blockers). °· Sulfites in foods and drinks. Foods and drinks that may contain sulfites include dried fruit, potato chips, and sparkling grape juice. °· Infections or inflammatory conditions such as the flu, a cold, or an inflammation of the nasal membranes (rhinitis). °· Gastroesophageal reflux disease (GERD). °· Exercise or strenuous activity. °SYMPTOMS °Symptoms may occur immediately after asthma is triggered or many hours later. Symptoms include: °· Wheezing. °· Excessive nighttime or early morning coughing. °· Frequent or severe coughing with a common cold. °· Chest tightness. °· Shortness of breath. °DIAGNOSIS  °The diagnosis of asthma is made by a review of your medical history and a physical exam.  Tests may also be performed. These may include: °· Lung function studies. These tests show how much air you breathe in and out. °· Allergy tests. °· Imaging tests such as X-rays. °TREATMENT  °Asthma cannot be cured, but it can usually be controlled. Treatment involves identifying and avoiding your asthma triggers. It also involves medicines. There are 2 classes of medicine used for asthma treatment:  °· Controller medicines. These prevent asthma symptoms from occurring. They are usually taken every day. °· Reliever or rescue medicines. These quickly relieve asthma symptoms. They are used as needed and provide short-term relief. °Your health care provider will help you create an asthma action plan. An asthma action plan is a written plan for managing and treating your asthma attacks. It includes a list of your asthma triggers and how they may be avoided. It also includes information on when medicines should be taken and when their dosage should be changed. An action plan may also involve the use of a device called a peak flow meter. A peak flow meter measures how well the lungs are working. It helps you monitor your condition. °HOME CARE INSTRUCTIONS  °· Take medicines only as directed by your health care provider. Speak with your health care provider if you have questions about how or when to take   the medicines. °· Use a peak flow meter as directed by your health care provider. Record and keep track of readings. °· Understand and use the action plan to help minimize or stop an asthma attack without needing to seek medical care. °· Control your home environment in the following ways to help prevent asthma attacks: °¨ Do not smoke. Avoid being exposed to secondhand smoke. °¨ Change your heating and air conditioning filter regularly. °¨ Limit your use of fireplaces and wood stoves. °¨ Get rid of pests (such as roaches and mice) and their droppings. °¨ Throw away plants if you see mold on them. °¨ Clean your floors and  dust regularly. Use unscented cleaning products. °¨ Try to have someone else vacuum for you regularly. Stay out of rooms while they are being vacuumed and for a short while afterward. If you vacuum, use a dust mask from a hardware store, a double-layered or microfilter vacuum cleaner bag, or a vacuum cleaner with a HEPA filter. °¨ Replace carpet with wood, tile, or vinyl flooring. Carpet can trap dander and dust. °¨ Use allergy-proof pillows, mattress covers, and box spring covers. °¨ Wash bed sheets and blankets every week in hot water and dry them in a dryer. °¨ Use blankets that are made of polyester or cotton. °¨ Clean bathrooms and kitchens with bleach. If possible, have someone repaint the walls in these rooms with mold-resistant paint. Keep out of the rooms that are being cleaned and painted. °¨ Wash hands frequently. °SEEK MEDICAL CARE IF:  °· You have wheezing, shortness of breath, or a cough even if taking medicine to prevent attacks. °· The colored mucus you cough up (sputum) is thicker than usual. °· Your sputum changes from clear or white to yellow, green, gray, or bloody. °· You have any problems that may be related to the medicines you are taking (such as a rash, itching, swelling, or trouble breathing). °· You are using a reliever medicine more than 2-3 times per week. °· Your peak flow is still at 50-79% of your personal best after following your action plan for 1 hour. °· You have a fever. °SEEK IMMEDIATE MEDICAL CARE IF:  °· You seem to be getting worse and are unresponsive to treatment during an asthma attack. °· You are short of breath even at rest. °· You get short of breath when doing very little physical activity. °· You have difficulty eating, drinking, or talking due to asthma symptoms. °· You develop chest pain. °· You develop a fast heartbeat. °· You have a bluish color to your lips or fingernails. °· You are light-headed, dizzy, or faint. °· Your peak flow is less than 50% of your  personal best. °MAKE SURE YOU:  °· Understand these instructions. °· Will watch your condition. °· Will get help right away if you are not doing well or get worse. °Document Released: 10/21/2005 Document Revised: 03/07/2014 Document Reviewed: 05/20/2013 °ExitCare® Patient Information ©2015 [employer]. This information is not intended to replace advice given to you by your health care provider. Make sure you discuss any questions you have with your health care provider. ° °

## 2015-02-19 NOTE — ED Notes (Signed)
Pt reports SOB due to asthma attack, started about 20 minutes ago. Hx of asthma and feels the same. She used her albuterol inhaler with no relief. Audible wheezing.

## 2015-03-07 ENCOUNTER — Telehealth: Payer: Self-pay | Admitting: Family Medicine

## 2015-03-07 NOTE — Telephone Encounter (Signed)
Pt needs new rx generic adderall xr 20 mg °

## 2015-03-07 NOTE — Telephone Encounter (Signed)
Last OV 09/12/15

## 2015-03-08 MED ORDER — AMPHETAMINE-DEXTROAMPHET ER 20 MG PO CP24: 20.0000 mg | ORAL_CAPSULE | Freq: Two times a day (BID) | ORAL | Status: DC

## 2015-03-08 NOTE — Telephone Encounter (Signed)
Refill for 3 months. 

## 2015-03-08 NOTE — Telephone Encounter (Signed)
Left message on Vm that Rx is ready for pickup  

## 2015-03-21 ENCOUNTER — Other Ambulatory Visit: Payer: Self-pay | Admitting: Family Medicine

## 2015-03-22 ENCOUNTER — Telehealth: Payer: Self-pay | Admitting: Family Medicine

## 2015-03-22 MED ORDER — ALBUTEROL SULFATE HFA 108 (90 BASE) MCG/ACT IN AERS: 2.0000 | INHALATION_SPRAY | Freq: Four times a day (QID) | RESPIRATORY_TRACT | Status: DC | PRN

## 2015-03-22 NOTE — Telephone Encounter (Signed)
Rx sent to pharmacy   

## 2015-03-22 NOTE — Telephone Encounter (Signed)
Pt request refill of the following: albuterol (PROVENTIL HFA;VENTOLIN HFA) 108 (90 BASE) MCG/ACT inhaler   [address] [address]

## 2015-03-31 ENCOUNTER — Ambulatory Visit: Payer: Self-pay | Admitting: Family Medicine

## 2015-03-31 ENCOUNTER — Emergency Department (HOSPITAL_COMMUNITY)
Admission: EM | Admit: 2015-03-31 | Discharge: 2015-03-31 | Disposition: A | Discharge status: Home / Self Care | Payer: 59 | Attending: Emergency Medicine | Admitting: Emergency Medicine

## 2015-03-31 ENCOUNTER — Encounter (HOSPITAL_COMMUNITY): Payer: Self-pay | Admitting: Emergency Medicine

## 2015-03-31 DIAGNOSIS — K219 Gastro-esophageal reflux disease without esophagitis: Secondary | ICD-10-CM | POA: Insufficient documentation

## 2015-03-31 DIAGNOSIS — J45901 Unspecified asthma with (acute) exacerbation: Secondary | ICD-10-CM | POA: Insufficient documentation

## 2015-03-31 DIAGNOSIS — Z7951 Long term (current) use of inhaled steroids: Secondary | ICD-10-CM | POA: Insufficient documentation

## 2015-03-31 DIAGNOSIS — I10 Essential (primary) hypertension: Secondary | ICD-10-CM | POA: Insufficient documentation

## 2015-03-31 DIAGNOSIS — Z8619 Personal history of other infectious and parasitic diseases: Secondary | ICD-10-CM | POA: Insufficient documentation

## 2015-03-31 DIAGNOSIS — Z8742 Personal history of other diseases of the female genital tract: Secondary | ICD-10-CM | POA: Insufficient documentation

## 2015-03-31 DIAGNOSIS — G43909 Migraine, unspecified, not intractable, without status migrainosus: Secondary | ICD-10-CM | POA: Insufficient documentation

## 2015-03-31 DIAGNOSIS — Z79899 Other long term (current) drug therapy: Secondary | ICD-10-CM | POA: Insufficient documentation

## 2015-03-31 DIAGNOSIS — F329 Major depressive disorder, single episode, unspecified: Secondary | ICD-10-CM | POA: Insufficient documentation

## 2015-03-31 DIAGNOSIS — Z87440 Personal history of urinary (tract) infections: Secondary | ICD-10-CM | POA: Insufficient documentation

## 2015-03-31 MED ORDER — ALBUTEROL SULFATE (2.5 MG/3ML) 0.083% IN NEBU
5.0000 mg | INHALATION_SOLUTION | Freq: Once | RESPIRATORY_TRACT | Status: AC
  Administered 2015-03-31: 5 mg via RESPIRATORY_TRACT

## 2015-03-31 MED ORDER — ALBUTEROL SULFATE HFA 108 (90 BASE) MCG/ACT IN AERS
1.0000 | INHALATION_SPRAY | RESPIRATORY_TRACT | Status: DC | PRN
  Administered 2015-03-31: 1 via RESPIRATORY_TRACT
  Filled 2015-03-31: qty 6.7

## 2015-03-31 MED ORDER — ALBUTEROL SULFATE (2.5 MG/3ML) 0.083% IN NEBU
5.0000 mg | INHALATION_SOLUTION | Freq: Once | RESPIRATORY_TRACT | Status: AC
  Administered 2015-03-31: 5 mg via RESPIRATORY_TRACT
  Filled 2015-03-31: qty 6

## 2015-03-31 MED ORDER — IPRATROPIUM BROMIDE 0.02 % IN SOLN
0.5000 mg | Freq: Once | RESPIRATORY_TRACT | Status: AC
  Administered 2015-03-31: 0.5 mg via RESPIRATORY_TRACT
  Filled 2015-03-31: qty 2.5

## 2015-03-31 MED ORDER — PREDNISONE 20 MG PO TABS
60.0000 mg | ORAL_TABLET | Freq: Once | ORAL | Status: AC
  Administered 2015-03-31: 60 mg via ORAL
  Filled 2015-03-31: qty 3

## 2015-03-31 NOTE — ED Notes (Signed)
Pt. reports asthma attack onset last night with occasional productive cough and wheezing unrelieved by rescue MDI . Denies fever or chills.

## 2015-03-31 NOTE — ED Provider Notes (Signed)
CSN: [id number]     Arrival date & time 03/31/15  [id number] History   First MD Initiated Contact with Patient 03/31/15 [id number]     Chief Complaint  Patient presents with  . Asthma     (Consider location/radiation/quality/duration/timing/severity/associated sxs/prior Treatment) The history is provided by the patient and medical records.    This is a 49 year old female with past medical history significant for hypertension, depression, GERD, asthma, hepatitis A, migraine headaches, ovarian cysts, presenting to the ED for asthma attack. Patient states it started just a little while ago upon waking. She reports chest tightness, wheezing, and intermittent cough since asthma exacerbation began.  She denies any fever, chills, sweats, or sick contacts. She used her home rescue inhaler without relief.  VSS.  Past Medical History  Diagnosis Date  . Depression   . Hypertension   . Asthma   . GERD (gastroesophageal reflux disease)   . Hepatitis A   . Jaundice   . Blood transfusion   . Migraines   . UTI (urinary tract infection)   . Ovarian cyst    Past Surgical History  Procedure Laterality Date  . Appendectomy  1994  . Ovarian cyst removal    . Tonsillectomy and adenoidectomy     Family History  Problem Relation Age of Onset  . Asthma Son   . Hypertension Other   . Crohn's disease Father   . Crohn's disease Maternal Aunt   . Esophageal cancer Maternal Uncle   . Breast cancer Maternal Grandmother    History  Substance Use Topics  . Smoking status: Never Smoker   . Smokeless tobacco: Never Used  . Alcohol Use: No   OB History    No data available     Review of Systems  Respiratory: Positive for cough, chest tightness, shortness of breath and wheezing.   All other systems reviewed and are negative.     Allergies  Aspirin; Codeine sulfate; and Hydrocodone-acetaminophen  Home Medications   Prior to Admission medications   Medication Sig Start Date End Date Taking? Authorizing  Provider  albuterol (PROVENTIL HFA;VENTOLIN HFA) 108 (90 BASE) MCG/ACT inhaler Inhale 2-3 puffs into the lungs every 6 (six) hours as needed for wheezing or shortness of breath. 03/22/15   Kristian CoveyBruce W Burchette, MD  albuterol (PROVENTIL) (2.5 MG/3ML) 0.083% nebulizer solution Take 3 mLs (2.5 mg total) by nebulization every 6 (six) hours as needed for wheezing or shortness of breath. 09/12/14   Arby BarretteMarcy Pfeiffer, MD  amphetamine-dextroamphetamine (ADDERALL XR) 20 MG 24 hr capsule Take 1 capsule (20 mg total) by mouth 2 (two) times daily. May refill in two months. 03/08/15   Kristian CoveyBruce W Burchette, MD  amphetamine-dextroamphetamine (ADDERALL XR) 20 MG 24 hr capsule Take 1 capsule (20 mg total) by mouth 2 (two) times daily. 03/08/15   Kristian CoveyBruce W Burchette, MD  amphetamine-dextroamphetamine (ADDERALL XR) 20 MG 24 hr capsule Take 1 capsule (20 mg total) by mouth 2 (two) times daily. May refill in one month. 03/08/15   Kristian CoveyBruce W Burchette, MD  budesonide-formoterol (SYMBICORT) 160-4.5 MCG/ACT inhaler Inhale 2 puffs into the lungs 2 (two) times daily.    Historical Provider, MD  Fluticasone-Salmeterol (ADVAIR) 250-50 MCG/DOSE AEPB Inhale 1 puff into the lungs 2 (two) times daily. Patient not taking: Reported on 02/19/2015 01/12/15   Kristian CoveyBruce W Burchette, MD  ipratropium (ATROVENT) 0.02 % nebulizer solution Take 2.5 mLs (0.5 mg total) by nebulization 4 (four) times daily. Patient taking differently: Take 0.5 mg by nebulization every 6 (six) hours  as needed for wheezing or shortness of breath.  09/12/14   Arby Barrette, MD  LORazepam (ATIVAN) 1 MG tablet Take 1 tablet (1 mg total) by mouth every 6 (six) hours as needed for anxiety. Patient not taking: Reported on 02/19/2015 11/21/14   Kristian Covey, MD  losartan (COZAAR) 50 MG tablet Take 1 tablet (50 mg total) by mouth daily. 11/21/14   Kristian Covey, MD  predniSONE (DELTASONE) 10 MG tablet Take q day 6,5,4,3,2,1 02/19/15   Mancel Bale, MD  ranitidine (ZANTAC) 150 MG tablet Take 150  mg by mouth 2 (two) times daily as needed for heartburn.    Historical Provider, MD  sertraline (ZOLOFT) 100 MG tablet Take 1 tablet (100 mg total) by mouth daily. Patient not taking: Reported on 02/19/2015 10/27/14   Kristian Covey, MD  SYMBICORT 160-4.5 MCG/ACT inhaler INHALE 2 PUFFS BY MOUTH INTO THE LUNGS TWICE A DAY 03/22/15   Kristian Covey, MD   BP 155/108 mmHg  Pulse 92  Temp(Src) 98.3 F (36.8 C) (Oral)  Resp 18  Ht  (1.676 m)  Wt 145 lb (65.772 kg)  BMI 23.41 kg/m2  SpO2 95%  LMP 03/01/2015   Physical Exam  Constitutional: She is oriented to person, place, and time. She appears well-developed and well-nourished.  HENT:  Head: Normocephalic and atraumatic.  Mouth/Throat: Oropharynx is clear and moist.  Eyes: Conjunctivae and EOM are normal. Pupils are equal, round, and reactive to light.  Neck: Normal range of motion.  Cardiovascular: Normal rate, regular rhythm and normal heart sounds.   Pulmonary/Chest: Effort normal. No respiratory distress. She has wheezes.  Diffuse expiratory wheezes, speaking in short sentences, no accessory muscle use or retractions  Abdominal: Soft. Bowel sounds are normal.  Musculoskeletal: Normal range of motion.  Neurological: She is alert and oriented to person, place, and time.  Skin: Skin is warm and dry.  Psychiatric: She has a normal mood and affect.  Nursing note and vitals reviewed.   ED Course  Procedures (including critical care time) Labs Review Labs Reviewed - No data to display  Imaging Review No results found.   EKG Interpretation None      MDM   Final diagnoses:  Asthma, unspecified asthma severity, with acute exacerbation   49 year old female here with acute asthma exacerbation, onset last night. She has used her home rescue inhaler without relief. She has not used her nebulizer. She denies any recent fever, chills, or sick contacts. Intermittent cough since asthma attack began. On exam, patient afebrile,  nontoxic. She has diffuse expiratory wheezes and is speaking in short sentences. She has no accessory muscle use, retractions, or signs of distress. Her O2 sats are stable on room air. She was given nebulizer treatments and dose of prednisone here in the ED with improvement of her symptoms. On repeat evaluation her lung sounds are clear. Her vital signs remained stable, states she feels better. Doubt ACS, PE, dissection, or other cardiac event at this time.  Patient will be discharged home, instructed to continue nebulizer treatments every 4-6 hours as needed for shortness of breath and wheezing. Given refill of her rescue inhaler here in ED.  FU with PCP encouraged.  Discussed plan with patient, he/she acknowledged understanding and agreed with plan of care.  Return precautions given for new or worsening symptoms.   Garlon Hatchet, PA-C 03/31/15 1018  Loren Racer, MD 04/01/15 [phone]

## 2015-03-31 NOTE — Discharge Instructions (Signed)
Recommend to continue home nebulizer treatments every 4-6 hours as needed for wheezing/shortness of breath.  Use inhaler for rescue. Follow-up with your primary care physician. Return to the ED for new or worsening symptoms.

## 2015-04-04 ENCOUNTER — Telehealth: Payer: Self-pay

## 2015-04-04 NOTE — Telephone Encounter (Signed)
Received a call from [facility] at [facility] stating that pt had "scratched the may fill by" off of her prescriptions.  Advised Angie that pt's prescriptions were printed on 5.4.2016 with the initial one that could be filled on 5.4.2016, one stating may fill in 1 month andmay fill in 2 months.  Angie verbalized understanding.

## 2015-04-21 ENCOUNTER — Telehealth: Payer: Self-pay | Admitting: Family Medicine

## 2015-04-21 NOTE — Telephone Encounter (Signed)
Last visit 11/21/14 Last refill 03/08/15 #60 2 refill Pt last refills were on 20 mg

## 2015-04-21 NOTE — Telephone Encounter (Signed)
Pt needs new rx generic adderall 10 mg not 20 mg xr due to having trouble sleeping

## 2015-04-23 NOTE — Telephone Encounter (Signed)
OK to change to 10 mg

## 2015-04-24 NOTE — Telephone Encounter (Signed)
Yes.  Adderall 10 mg po bid

## 2015-04-24 NOTE — Telephone Encounter (Signed)
Left message for patient to return call.

## 2015-04-24 NOTE — Telephone Encounter (Signed)
Pt is wanting to take the medication BID of the 10mg  for only 30 day

## 2015-04-25 MED ORDER — AMPHETAMINE-DEXTROAMPHETAMINE 10 MG PO TABS: 10.0000 mg | ORAL_TABLET | Freq: Two times a day (BID) | ORAL | Status: DC

## 2015-04-25 NOTE — Telephone Encounter (Signed)
Left message for patient on VM. Rx is ready for pick up

## 2015-05-10 ENCOUNTER — Other Ambulatory Visit: Payer: Self-pay | Admitting: Family Medicine

## 2015-05-19 ENCOUNTER — Telehealth: Payer: Self-pay | Admitting: Family Medicine

## 2015-05-19 MED ORDER — AMPHETAMINE-DEXTROAMPHETAMINE 10 MG PO TABS: 10.0000 mg | ORAL_TABLET | Freq: Two times a day (BID) | ORAL | Status: DC

## 2015-05-19 NOTE — Telephone Encounter (Signed)
Refills OK. 

## 2015-05-19 NOTE — Telephone Encounter (Signed)
° ° ° °  Pt request refill of the following: ° °amphetamine-dextroamphetamine (ADDERALL) 10 MG tablet ° ° °Phamacy: °

## 2015-05-19 NOTE — Telephone Encounter (Signed)
I called the pt and she is aware the Rx was left at the front desk for her to pick up. 

## 2015-06-07 ENCOUNTER — Telehealth: Payer: Self-pay | Admitting: Family Medicine

## 2015-06-07 NOTE — Telephone Encounter (Signed)
Last visit 11/21/14 Last refill 03/08/15 #60 2 refill

## 2015-06-07 NOTE — Telephone Encounter (Signed)
Pt request refill of the following: amphetamine-dextroamphetamine (ADDERALL XR) 20 MG 24 hr capsule   Pt said she will no longer be taking the   Phamacy:

## 2015-06-07 NOTE — Telephone Encounter (Signed)
Refill for 3 months OK. 

## 2015-06-08 MED ORDER — AMPHETAMINE-DEXTROAMPHET ER 20 MG PO CP24: 20.0000 mg | ORAL_CAPSULE | Freq: Two times a day (BID) | ORAL | Status: DC

## 2015-06-08 NOTE — Telephone Encounter (Signed)
Left message on VM that Rx is ready for pick up 

## 2015-06-29 ENCOUNTER — Telehealth: Payer: Self-pay

## 2015-06-29 MED ORDER — AMPHETAMINE-DEXTROAMPHETAMINE 10 MG PO TABS: 10.0000 mg | ORAL_TABLET | Freq: Two times a day (BID) | ORAL | Status: DC

## 2015-06-29 NOTE — Telephone Encounter (Signed)
Left message on Vm that Rx is ready for pickup  

## 2015-06-29 NOTE — Telephone Encounter (Signed)
The pt called hoping to get a refill of her adderall     Pt callback - [phone]

## 2015-06-29 NOTE — Telephone Encounter (Signed)
OK to refill

## 2015-06-29 NOTE — Telephone Encounter (Signed)
Pt stated that on 06/07/15 that she would no longer be taking Adderall  she would only take 20 mg

## 2015-07-03 ENCOUNTER — Telehealth: Payer: Self-pay | Admitting: Family Medicine

## 2015-07-03 MED ORDER — ALPRAZOLAM 0.25 MG PO TABS: ORAL_TABLET | ORAL | Status: DC

## 2015-07-03 NOTE — Telephone Encounter (Signed)
Let's prescribed #10 with no refill. Take 1-21 hour prior to flight

## 2015-07-03 NOTE — Telephone Encounter (Signed)
RX called to pharmacy.

## 2015-07-03 NOTE — Telephone Encounter (Signed)
Pt will be traveling /plane/ to [place] and wants to know if you will prescribe her ALPRAZolam (XANAX) 0.25 MG table  bc she is nervous. Not had in a long time (2013)  Prefers not to have to come in. . [facility]. Germain Osgood

## 2015-07-07 ENCOUNTER — Telehealth: Payer: Self-pay

## 2015-07-07 NOTE — Telephone Encounter (Signed)
Pt should have refills for the RX she received on 06/08/15. Left message on VM

## 2015-07-07 NOTE — Telephone Encounter (Signed)
The pt called and is hoping to get a refill of her adderall xr  Callback - [phone]

## 2015-07-11 ENCOUNTER — Telehealth: Payer: Self-pay | Admitting: Family Medicine

## 2015-07-11 NOTE — Telephone Encounter (Signed)
Refill but imperative to keep these in a safe place

## 2015-07-11 NOTE — Telephone Encounter (Signed)
Pt can not find her generic adderall rxs. Pt lost sept  And oct rxs

## 2015-07-12 ENCOUNTER — Other Ambulatory Visit: Payer: Self-pay

## 2015-07-12 ENCOUNTER — Other Ambulatory Visit: Payer: Self-pay | Admitting: Family Medicine

## 2015-07-12 MED ORDER — AMPHETAMINE-DEXTROAMPHET ER 20 MG PO CP24: 20.0000 mg | ORAL_CAPSULE | Freq: Two times a day (BID) | ORAL | Status: DC

## 2015-07-12 NOTE — Telephone Encounter (Signed)
Left detailed message on Vm that Rx is ready for pickup

## 2015-07-14 ENCOUNTER — Encounter: Payer: Self-pay | Admitting: Family Medicine

## 2015-07-14 ENCOUNTER — Ambulatory Visit (INDEPENDENT_AMBULATORY_CARE_PROVIDER_SITE_OTHER): Payer: Self-pay | Admitting: Family Medicine

## 2015-07-14 VITALS — BP 132/80 | HR 86 | Temp 98.6°F

## 2015-07-14 DIAGNOSIS — J069 Acute upper respiratory infection, unspecified: Secondary | ICD-10-CM

## 2015-07-14 DIAGNOSIS — B9789 Other viral agents as the cause of diseases classified elsewhere: Principal | ICD-10-CM

## 2015-07-14 MED ORDER — BECLOMETHASONE DIPROPIONATE 80 MCG/ACT IN AERS: 2.0000 | INHALATION_SPRAY | Freq: Two times a day (BID) | RESPIRATORY_TRACT | Status: DC

## 2015-07-14 MED ORDER — PREDNISONE 10 MG PO TABS: ORAL_TABLET | ORAL | Status: DC

## 2015-07-14 NOTE — Progress Notes (Signed)
Pre visit review using our clinic review tool, if applicable. No additional management support is needed unless otherwise documented below in the visit note. 

## 2015-07-14 NOTE — Progress Notes (Signed)
   Subjective:    Patient ID: [name], female    DOB: 1966/05/07, 49 y.o.   MRN: [id number]  HPI Long history of asthma. 2 day history of cough and nasal congestion fatigue. She's had occasional chills but no document of fever. Poor compliance with steroid inhaler secondary to cost issues. She has been on Symbicort and the past but currently not taking because of cost. She has been using Ventolin frequently. Her daughter was just diagnosed with mono. Patient denies any significant sore throat or adenopathy  Past Medical History  Diagnosis Date  . Depression   . Hypertension   . Asthma   . GERD (gastroesophageal reflux disease)   . Hepatitis A   . Jaundice   . Blood transfusion   . Migraines   . UTI (urinary tract infection)   . Ovarian cyst    Past Surgical History  Procedure Laterality Date  . Appendectomy  1994  . Ovarian cyst removal    . Tonsillectomy and adenoidectomy      reports that she has never smoked. She has never used smokeless tobacco. She reports that she does not drink alcohol or use illicit drugs. family history includes Asthma in her son; Breast cancer in her maternal grandmother; Crohn's disease in her father and maternal aunt; Esophageal cancer in her maternal uncle; Hypertension in her other. Allergies  Allergen Reactions  . Aspirin Shortness Of Breath    REACTION: wheezing problems  . Codeine Sulfate Hives and Nausea And Vomiting    REACTION: rash, itiching  . Hydrocodone-Acetaminophen Itching and Rash      Review of Systems  Constitutional: Negative for fever and chills.  HENT: Positive for congestion. Negative for sore throat.   Respiratory: Positive for cough.        Objective:   Physical Exam  Constitutional: She appears well-developed and well-nourished.  HENT:  Right Ear: External ear normal.  Left Ear: External ear normal.  Mouth/Throat: Oropharynx is clear and moist.  Neck: Neck supple.  Cardiovascular: Normal rate and  regular rhythm.   Pulmonary/Chest: Effort normal and breath sounds normal. No respiratory distress. She has no wheezes. She has no rales.  Lymphadenopathy:    She has no cervical adenopathy.          Assessment & Plan:  Probable viral URI. She does not have any active wheezing at this time but has been very fragile in the past and easy to get into trouble from a respiratory standpoint. We wrote for prednisone taper if she notes any wheezing whatsoever. Refill Ventolin with caution against overuse. Qvar 80 mg 2 puffs twice daily

## 2015-07-18 ENCOUNTER — Telehealth: Payer: Self-pay | Admitting: Family Medicine

## 2015-07-18 MED ORDER — ALBUTEROL SULFATE HFA 108 (90 BASE) MCG/ACT IN AERS: INHALATION_SPRAY | RESPIRATORY_TRACT | Status: DC

## 2015-07-18 NOTE — Telephone Encounter (Signed)
Rx sent to pharmacy   

## 2015-07-18 NOTE — Telephone Encounter (Signed)
Pt needs a refill on ventolin [facility] [address]

## 2015-07-20 ENCOUNTER — Encounter: Payer: Self-pay | Admitting: Family Medicine

## 2015-07-20 ENCOUNTER — Ambulatory Visit (INDEPENDENT_AMBULATORY_CARE_PROVIDER_SITE_OTHER): Payer: Self-pay | Admitting: Family Medicine

## 2015-07-20 VITALS — BP 150/90 | HR 95 | Temp 99.0°F

## 2015-07-20 DIAGNOSIS — R062 Wheezing: Secondary | ICD-10-CM

## 2015-07-20 MED ORDER — METHYLPREDNISOLONE ACETATE 80 MG/ML IJ SUSP
80.0000 mg | Freq: Once | INTRAMUSCULAR | Status: AC
  Administered 2015-07-20: 80 mg via INTRAMUSCULAR

## 2015-07-20 MED ORDER — ALPRAZOLAM 0.25 MG PO TABS: ORAL_TABLET | ORAL | Status: DC

## 2015-07-20 MED ORDER — PREDNISONE 10 MG PO TABS: ORAL_TABLET | ORAL | Status: DC

## 2015-07-20 NOTE — Progress Notes (Signed)
   Subjective:    Patient ID: [name], female    DOB: 1966/08/14, 49 y.o.   MRN: [id number]  HPI   Long-standing history of asthma. She has not been on her controller medications. Recently prescribed Qvar but she has not yet started. She just finished prednisone taper yesterday. Mild improvement but still wheezing. She has upcoming flight tomorrow and is concerned because of that. No fever. Cough mostly nonproductive. Using Ventolin as needed.  Past Medical History  Diagnosis Date  . Depression   . Hypertension   . Asthma   . GERD (gastroesophageal reflux disease)   . Hepatitis A   . Jaundice   . Blood transfusion   . Migraines   . UTI (urinary tract infection)   . Ovarian cyst    Past Surgical History  Procedure Laterality Date  . Appendectomy  1994  . Ovarian cyst removal    . Tonsillectomy and adenoidectomy      reports that she has never smoked. She has never used smokeless tobacco. She reports that she does not drink alcohol or use illicit drugs. family history includes Asthma in her son; Breast cancer in her maternal grandmother; Crohn's disease in her father and maternal aunt; Esophageal cancer in her maternal uncle; Hypertension in her other. Allergies  Allergen Reactions  . Aspirin Shortness Of Breath    REACTION: wheezing problems  . Codeine Sulfate Hives and Nausea And Vomiting    REACTION: rash, itiching  . Hydrocodone-Acetaminophen Itching and Rash      Review of Systems  Constitutional: Negative for fever and chills.  Respiratory: Positive for cough, shortness of breath and wheezing.   Cardiovascular: Negative for chest pain.       Objective:   Physical Exam  Constitutional: She appears well-developed and well-nourished.  Neck: Neck supple.  Cardiovascular: Normal rate and regular rhythm.   Pulmonary/Chest: Effort normal. She has wheezes. She has no rales.  She has some diffuse wheezes but no retractions  Musculoskeletal: She exhibits no  edema.          Assessment & Plan:  Asthma exacerbation. Depo-Medrol 80 mg IM given. She is encouraged to start Qvar. Continue as needed Ventolin. We wrote for another oral prednisone taper if she is not getting adequate relief with Depo-Medrol

## 2015-07-20 NOTE — Progress Notes (Signed)
Pre visit review using our clinic review tool, if applicable. No additional management support is needed unless otherwise documented below in the visit note. 

## 2015-07-20 NOTE — Patient Instructions (Signed)
Follow up promptly for any fever or increased shortness of breath Go ahead and start Qvar 2 puffs twice daily-be sure to rinse mouth after use to prevent thrush

## 2015-08-02 ENCOUNTER — Other Ambulatory Visit: Payer: Self-pay | Admitting: Family Medicine

## 2015-08-02 NOTE — Telephone Encounter (Signed)
Pt need new rx generic adderall 10 mg

## 2015-08-02 NOTE — Telephone Encounter (Signed)
Pt last visit was 07/20/15 and last Rx refill 07/12/15. Please advise

## 2015-08-03 MED ORDER — AMPHETAMINE-DEXTROAMPHETAMINE 10 MG PO TABS: 10.0000 mg | ORAL_TABLET | Freq: Two times a day (BID) | ORAL | Status: DC

## 2015-08-03 NOTE — Telephone Encounter (Signed)
Before this prescription was printed it was last printed on 8.25.2016.

## 2015-08-03 NOTE — Telephone Encounter (Signed)
Please clarify. If this was filled on 07-12-15 not due yet.

## 2015-08-28 ENCOUNTER — Telehealth: Payer: Self-pay | Admitting: Family Medicine

## 2015-08-28 MED ORDER — AMPHETAMINE-DEXTROAMPHETAMINE 10 MG PO TABS: 10.0000 mg | ORAL_TABLET | Freq: Two times a day (BID) | ORAL | Status: DC

## 2015-08-28 NOTE — Telephone Encounter (Signed)
Pt req refill amphetamine-dextroamphetamine (ADDERALL XR) 20 MG 24 hr capsule

## 2015-08-28 NOTE — Telephone Encounter (Signed)
Pt not due for refill until December

## 2015-08-28 NOTE — Telephone Encounter (Signed)
Pt has lost her October rx and needs another one please. Would like to only have one month filled at a time so she does not lose it in the future. Last fill was 07/16/15.

## 2015-08-28 NOTE — Telephone Encounter (Signed)
Refill for one month only.

## 2015-08-30 NOTE — Telephone Encounter (Signed)
Rx is ready for pick up.

## 2015-08-31 ENCOUNTER — Other Ambulatory Visit: Payer: Self-pay | Admitting: Family Medicine

## 2015-08-31 ENCOUNTER — Telehealth: Payer: Self-pay | Admitting: Family Medicine

## 2015-08-31 MED ORDER — AMPHETAMINE-DEXTROAMPHET ER 20 MG PO CP24: 20.0000 mg | ORAL_CAPSULE | Freq: Two times a day (BID) | ORAL | Status: DC

## 2015-08-31 NOTE — Telephone Encounter (Signed)
Pt states the rx she picked up yesterday was the wrong rx. Pt needs amphetamine-dextroamphetamine (ADDERALL XR) 20 MG 24 hr capsule  Pt states she received amphetamine-dextroamphetamine (ADDERALL) 10 MG tablet  She did take this rx home from the pharmacy before she realized it was wrong one.

## 2015-09-25 ENCOUNTER — Telehealth: Payer: Self-pay | Admitting: Family Medicine

## 2015-09-25 NOTE — Telephone Encounter (Signed)
° ° ° °  Pt request refill of the following: ° ° °amphetamine-dextroamphetamine (ADDERALL XR) 20 MG 24 hr capsule ° ° °Phamacy: °

## 2015-09-25 NOTE — Telephone Encounter (Signed)
Pt cannot have anymore refills until seen. Please schedule follow-up.

## 2015-09-27 NOTE — Telephone Encounter (Signed)
Pt has an appt sch for 10-03-15

## 2015-10-03 ENCOUNTER — Ambulatory Visit (INDEPENDENT_AMBULATORY_CARE_PROVIDER_SITE_OTHER): Payer: Self-pay | Admitting: Family Medicine

## 2015-10-03 ENCOUNTER — Encounter: Payer: Self-pay | Admitting: Family Medicine

## 2015-10-03 VITALS — BP 154/100 | HR 98 | Resp 16

## 2015-10-03 DIAGNOSIS — I10 Essential (primary) hypertension: Secondary | ICD-10-CM

## 2015-10-03 DIAGNOSIS — F411 Generalized anxiety disorder: Secondary | ICD-10-CM

## 2015-10-03 DIAGNOSIS — F909 Attention-deficit hyperactivity disorder, unspecified type: Secondary | ICD-10-CM

## 2015-10-03 DIAGNOSIS — Z23 Encounter for immunization: Secondary | ICD-10-CM

## 2015-10-03 DIAGNOSIS — F988 Other specified behavioral and emotional disorders with onset usually occurring in childhood and adolescence: Secondary | ICD-10-CM

## 2015-10-03 MED ORDER — AMPHETAMINE-DEXTROAMPHET ER 20 MG PO CP24: 20.0000 mg | ORAL_CAPSULE | ORAL | Status: DC

## 2015-10-03 MED ORDER — SERTRALINE HCL 100 MG PO TABS: 100.0000 mg | ORAL_TABLET | Freq: Every day | ORAL | Status: DC

## 2015-10-03 NOTE — Progress Notes (Signed)
Subjective:    Patient ID: [name], female    DOB: [birth year], 49 y.o.   MRN: [id number]  HPI Patient here for follow-up regarding several items as below. Her chronic prompt include history of hypertension, asthma, GERD, chronic anxiety and depression, and attention deficit disorder.  Asthma history. Still needs flu vaccine. Has had previous pneumonia vaccination. Long history of poor compliance with inhalers. No recent flareups. Has been followed by pulmonary in the past. Inconsistent follow-up.  Attention deficit disorder. She's taken Adderall XR and short acting Adderall the past. She denies any side effects. She has had poorly controlled blood pressure at times is currently not taking her blood pressure medication and apparently has not taken this for several months now. No recent headaches. No chest pains.  History of chronic anxiety and depression. She's been on sertraline past. She has what sounds clearly like panic attacks. She had one as recent as a week ago. These if prompted ER visits in the past. She had been on sertraline which is working fairly well suppressed knees but ran out of medication and never got this refilled.  Past Medical History  Diagnosis Date  . Depression   . Hypertension   . Asthma   . GERD (gastroesophageal reflux disease)   . Hepatitis A   . Jaundice   . Blood transfusion   . Migraines   . UTI (urinary tract infection)   . Ovarian cyst    Past Surgical History  Procedure Laterality Date  . Appendectomy  1994  . Ovarian cyst removal    . Tonsillectomy and adenoidectomy      reports that she has never smoked. She has never used smokeless tobacco. She reports that she does not drink alcohol or use illicit drugs. family history includes Asthma in her son; Breast cancer in her maternal grandmother; Crohn's disease in her father and maternal aunt; Esophageal cancer in her maternal uncle; Hypertension in her other. Allergies  Allergen  Reactions  . Aspirin Shortness Of Breath    REACTION: wheezing problems  . Codeine Sulfate Hives and Nausea And Vomiting    REACTION: rash, itiching  . Hydrocodone-Acetaminophen Itching and Rash      Review of Systems  Constitutional: Negative for fatigue.  Eyes: Negative for visual disturbance.  Respiratory: Negative for cough, chest tightness, shortness of breath and wheezing.   Cardiovascular: Negative for chest pain, palpitations and leg swelling.  Neurological: Negative for dizziness, seizures, syncope, weakness, light-headedness and headaches.  Psychiatric/Behavioral: Negative for sleep disturbance and agitation. The patient is nervous/anxious.        Objective:   Physical Exam  Constitutional: She appears well-developed and well-nourished. No distress.  Neck: Neck supple. No thyromegaly present.  Cardiovascular: Normal rate and regular rhythm.   Pulmonary/Chest: Effort normal and breath sounds normal. No respiratory distress. She has no wheezes. She has no rales.  Musculoskeletal: She exhibits no edema.  Psychiatric: She has a normal mood and affect. Her behavior is normal.          Assessment & Plan:  #1 hypertension. Currently not treated and poorly controlled. Continue regular aerobic exercise. Start back losartan 50 mg once daily. Reassess blood pressure one month. #2 history of anxiety /depression. Probable panic attacks. Start back sertraline 50 mg daily for 2 weeks then titrate to 100 mg daily as tolerated. #3 history of ADD. She's been on higher dose stimulant medication the past we've recommended no more than Adderall XR 20 mg once daily at this  time especially with poorly controlled blood pressure. Consider taking her off altogether if not better controlled on antihypertensive medication at follow-up in one month  #4 health maintenance. Flu vaccine given

## 2015-10-03 NOTE — Progress Notes (Signed)
Pre visit review using our clinic review tool, if applicable. No additional management support is needed unless otherwise documented below in the visit note. 

## 2015-10-03 NOTE — Patient Instructions (Signed)
Start back the Sertraline one half daily for 2 weeks and then increase to one daily. Start back the Losartan one daily.

## 2015-10-10 ENCOUNTER — Telehealth: Payer: Self-pay | Admitting: Family Medicine

## 2015-10-10 NOTE — Telephone Encounter (Signed)
Ms. [name] called saying she'd like to go back to taking two 10mg  Adderall's per day instead of one. She's so sleepy she can barely function and she's falling asleep at work. She'd like a phone call regarding this.  Pt's ph# [phone] Thank you.

## 2015-10-10 NOTE — Telephone Encounter (Signed)
My concern is her recent poorly controlled BP.  First, make sure she is back on the Losartan.  I would stay with low does Adderall 10 mg bid until we confirm BP to goal.  Make sure she is getting adequate sleep as first step.

## 2015-10-10 NOTE — Telephone Encounter (Signed)
Pt was recently seen for this, please advise on increase.

## 2015-10-11 NOTE — Telephone Encounter (Signed)
Left message to return call 

## 2015-10-12 NOTE — Telephone Encounter (Signed)
Pt is aware of annotations. She is going to monitor home readings and follow-up on 10/20/15.

## 2015-10-20 ENCOUNTER — Encounter: Payer: Self-pay | Admitting: Family Medicine

## 2015-10-20 ENCOUNTER — Other Ambulatory Visit: Payer: Self-pay | Admitting: Family Medicine

## 2015-10-20 ENCOUNTER — Ambulatory Visit (INDEPENDENT_AMBULATORY_CARE_PROVIDER_SITE_OTHER): Payer: Self-pay | Admitting: Family Medicine

## 2015-10-20 VITALS — BP 170/118 | HR 76 | Temp 97.5°F | Resp 16

## 2015-10-20 DIAGNOSIS — I10 Essential (primary) hypertension: Secondary | ICD-10-CM

## 2015-10-20 MED ORDER — LOSARTAN POTASSIUM-HCTZ 100-12.5 MG PO TABS: 1.0000 | ORAL_TABLET | Freq: Every day | ORAL | Status: DC

## 2015-10-20 NOTE — Progress Notes (Signed)
Pre visit review using our clinic review tool, if applicable. No additional management support is needed unless otherwise documented below in the visit note. 

## 2015-10-20 NOTE — Patient Instructions (Signed)
DASH Eating Plan  DASH stands for "Dietary Approaches to Stop Hypertension." The DASH eating plan is a healthy eating plan that has been shown to reduce high blood pressure (hypertension). Additional health benefits may include reducing the risk of type 2 diabetes mellitus, heart disease, and stroke. The DASH eating plan may also help with weight loss.  WHAT DO I NEED TO KNOW ABOUT THE DASH EATING PLAN?  For the DASH eating plan, you will follow these general guidelines:  · Choose foods with a percent daily value for sodium of less than 5% (as listed on the food label).  · Use salt-free seasonings or herbs instead of table salt or sea salt.  · Check with your health care provider or pharmacist before using salt substitutes.  · Eat lower-sodium products, often labeled as "lower sodium" or "no salt added."  · Eat fresh foods.  · Eat more vegetables, fruits, and low-fat dairy products.  · Choose whole grains. Look for the word "whole" as the first word in the ingredient list.  · Choose fish and skinless chicken or turkey more often than red meat. Limit fish, poultry, and meat to 6 oz (170 g) each day.  · Limit sweets, desserts, sugars, and sugary drinks.  · Choose heart-healthy fats.  · Limit cheese to 1 oz (28 g) per day.  · Eat more home-cooked food and less restaurant, buffet, and fast food.  · Limit fried foods.  · Cook foods using methods other than frying.  · Limit canned vegetables. If you do use them, rinse them well to decrease the sodium.  · When eating at a restaurant, ask that your food be prepared with less salt, or no salt if possible.  WHAT FOODS CAN I EAT?  Seek help from a dietitian for individual calorie needs.  Grains  Whole grain or whole wheat bread. Brown rice. Whole grain or whole wheat pasta. Quinoa, bulgur, and whole grain cereals. Low-sodium cereals. Corn or whole wheat flour tortillas. Whole grain cornbread. Whole grain crackers. Low-sodium crackers.  Vegetables  Fresh or frozen vegetables  (raw, steamed, roasted, or grilled). Low-sodium or reduced-sodium tomato and vegetable juices. Low-sodium or reduced-sodium tomato sauce and paste. Low-sodium or reduced-sodium canned vegetables.   Fruits  All fresh, canned (in natural juice), or frozen fruits.  Meat and Other Protein Products  Ground beef (85% or leaner), grass-fed beef, or beef trimmed of fat. Skinless chicken or turkey. Ground chicken or turkey. Pork trimmed of fat. All fish and seafood. Eggs. Dried beans, peas, or lentils. Unsalted nuts and seeds. Unsalted canned beans.  Dairy  Low-fat dairy products, such as skim or 1% milk, 2% or reduced-fat cheeses, low-fat ricotta or cottage cheese, or plain low-fat yogurt. Low-sodium or reduced-sodium cheeses.  Fats and Oils  Tub margarines without trans fats. Light or reduced-fat mayonnaise and salad dressings (reduced sodium). Avocado. Safflower, olive, or canola oils. Natural peanut or almond butter.  Other  Unsalted popcorn and pretzels.  The items listed above may not be a complete list of recommended foods or beverages. Contact your dietitian for more options.  WHAT FOODS ARE NOT RECOMMENDED?  Grains  White bread. White pasta. White rice. Refined cornbread. Bagels and croissants. Crackers that contain trans fat.  Vegetables  Creamed or fried vegetables. Vegetables in a cheese sauce. Regular canned vegetables. Regular canned tomato sauce and paste. Regular tomato and vegetable juices.  Fruits  Dried fruits. Canned fruit in light or heavy syrup. Fruit juice.  Meat and Other Protein   Products  Fatty cuts of meat. Ribs, chicken wings, bacon, sausage, bologna, salami, chitterlings, fatback, hot dogs, bratwurst, and packaged luncheon meats. Salted nuts and seeds. Canned beans with salt.  Dairy  Whole or 2% milk, cream, half-and-half, and cream cheese. Whole-fat or sweetened yogurt. Full-fat cheeses or blue cheese. Nondairy creamers and whipped toppings. Processed cheese, cheese spreads, or cheese  curds.  Condiments  Onion and garlic salt, seasoned salt, table salt, and sea salt. Canned and packaged gravies. Worcestershire sauce. Tartar sauce. Barbecue sauce. Teriyaki sauce. Soy sauce, including reduced sodium. Steak sauce. Fish sauce. Oyster sauce. Cocktail sauce. Horseradish. Ketchup and mustard. Meat flavorings and tenderizers. Bouillon cubes. Hot sauce. Tabasco sauce. Marinades. Taco seasonings. Relishes.  Fats and Oils  Butter, stick margarine, lard, shortening, ghee, and bacon fat. Coconut, palm kernel, or palm oils. Regular salad dressings.  Other  Pickles and olives. Salted popcorn and pretzels.  The items listed above may not be a complete list of foods and beverages to avoid. Contact your dietitian for more information.  WHERE CAN I FIND MORE INFORMATION?  National Heart, [employer], [employer] Institute: www.nhlbi.nih.gov/health/health-topics/topics/dash/     This information is not intended to replace advice given to you by your health care provider. Make sure you discuss any questions you have with your health care provider.     Document Released: 10/10/2011 Document Revised: 11/11/2014 Document Reviewed: 08/25/2013  Elsevier Interactive Patient Education ©2016 Elsevier Inc.

## 2015-10-20 NOTE — Progress Notes (Signed)
   Subjective:    Patient ID: [name], female    DOB: 02/12/1966, 49 y.o.   MRN: [id number]  HPI  Patient seen follow-up hypertension. She has long history of hypertension but poor compliance with treatment. Recently started back losartan 50 mg daily but not taking consistently. She has some headaches today which have been relatively mild. She did not take her losartan today. High sodium diet. No alcohol use.  History of ADD on Adderall. Increased stress with her son who has had some recent health issues and school issues  Past Medical History  Diagnosis Date  . Depression   . Hypertension   . Asthma   . GERD (gastroesophageal reflux disease)   . Hepatitis A   . Jaundice   . Blood transfusion   . Migraines   . UTI (urinary tract infection)   . Ovarian cyst    Past Surgical History  Procedure Laterality Date  . Appendectomy  1994  . Ovarian cyst removal    . Tonsillectomy and adenoidectomy      reports that she has never smoked. She has never used smokeless tobacco. She reports that she does not drink alcohol or use illicit drugs. family history includes Asthma in her son; Breast cancer in her maternal grandmother; Crohn's disease in her father and maternal aunt; Esophageal cancer in her maternal uncle; Hypertension in her other. Allergies  Allergen Reactions  . Aspirin Shortness Of Breath    REACTION: wheezing problems  . Codeine Sulfate Hives and Nausea And Vomiting    REACTION: rash, itiching  . Hydrocodone-Acetaminophen Itching and Rash     Review of Systems  Constitutional: Positive for fatigue.  Eyes: Negative for visual disturbance.  Respiratory: Negative for cough, chest tightness, shortness of breath and wheezing.   Cardiovascular: Negative for chest pain, palpitations and leg swelling.  Neurological: Positive for headaches. Negative for dizziness, seizures, syncope, weakness and light-headedness.       Objective:   Physical Exam    Constitutional: She appears well-developed and well-nourished.  Neck: Neck supple. No thyromegaly present.  Cardiovascular: Normal rate and regular rhythm.   Pulmonary/Chest: Effort normal and breath sounds normal. No respiratory distress. She has no wheezes. She has no rales.  Musculoskeletal: She exhibits no edema.          Assessment & Plan:  Hypertension. Poorly controlled. Poor compliance. Changes losartan to losartan HCTZ 100/12.5 one daily. Low-sodium diet. Information on DASH diet given. Hold Adderall altogether for now. Reassess blood pressure 3 weeks

## 2015-10-31 ENCOUNTER — Telehealth: Payer: Self-pay | Admitting: Family Medicine

## 2015-10-31 MED ORDER — AMPHETAMINE-DEXTROAMPHET ER 20 MG PO CP24: ORAL_CAPSULE | ORAL | Status: DC

## 2015-10-31 NOTE — Telephone Encounter (Signed)
Her most recent BP here was VERY high.  My recommendation was to hold Adderall altogether until BP improved.  We changed her medication.  See if she has been monitoring BP. IF this is coming down < 150/90 may refill her Adderall once and make sure we have follow up with her scheduled.

## 2015-10-31 NOTE — Telephone Encounter (Signed)
Pt new rx generic adderall xr 20 mg. Pt is going out of town tomorrow.

## 2015-10-31 NOTE — Telephone Encounter (Signed)
Pt has scheduled a follow-up appt on 11/16/2014. She states that the medication is really working and has all her readings on her BP monitor which she will bring into her appt. Printed script for you to sign.

## 2015-10-31 NOTE — Telephone Encounter (Signed)
Last refill was 10/03/2015. Okay to print with a DO NOT FILL DATE UNTIL 11/29?

## 2015-11-01 ENCOUNTER — Telehealth: Payer: Self-pay | Admitting: Family Medicine

## 2015-11-01 NOTE — Telephone Encounter (Signed)
Ms. [name] called saying the Rx for her Adderall says it can't be filled until after 12.29.16. She's going out of the state tomorrow and is wondering if a corrected Rx can be printed so she can have it filled today or tomorrow before leaving the city. Please give her a call regarding this.  Pt's ph# [phone] Thank you.

## 2015-11-01 NOTE — Telephone Encounter (Signed)
Pt is aware via voicemail that I have contacted [facility] to verify that the start date will be on 12/29. She is aware to contact office if she has any questions.

## 2015-11-14 ENCOUNTER — Other Ambulatory Visit: Payer: Self-pay | Admitting: Family Medicine

## 2015-11-14 IMAGING — CR DG CHEST 2V
2 series · 2 of 2 positions shown · non-contrast
Comparison: 04/06/2012

CLINICAL DATA: Cough.  Shortness of breath.  Asthma.

EXAM:
CHEST  2 VIEW

[w chest pa]
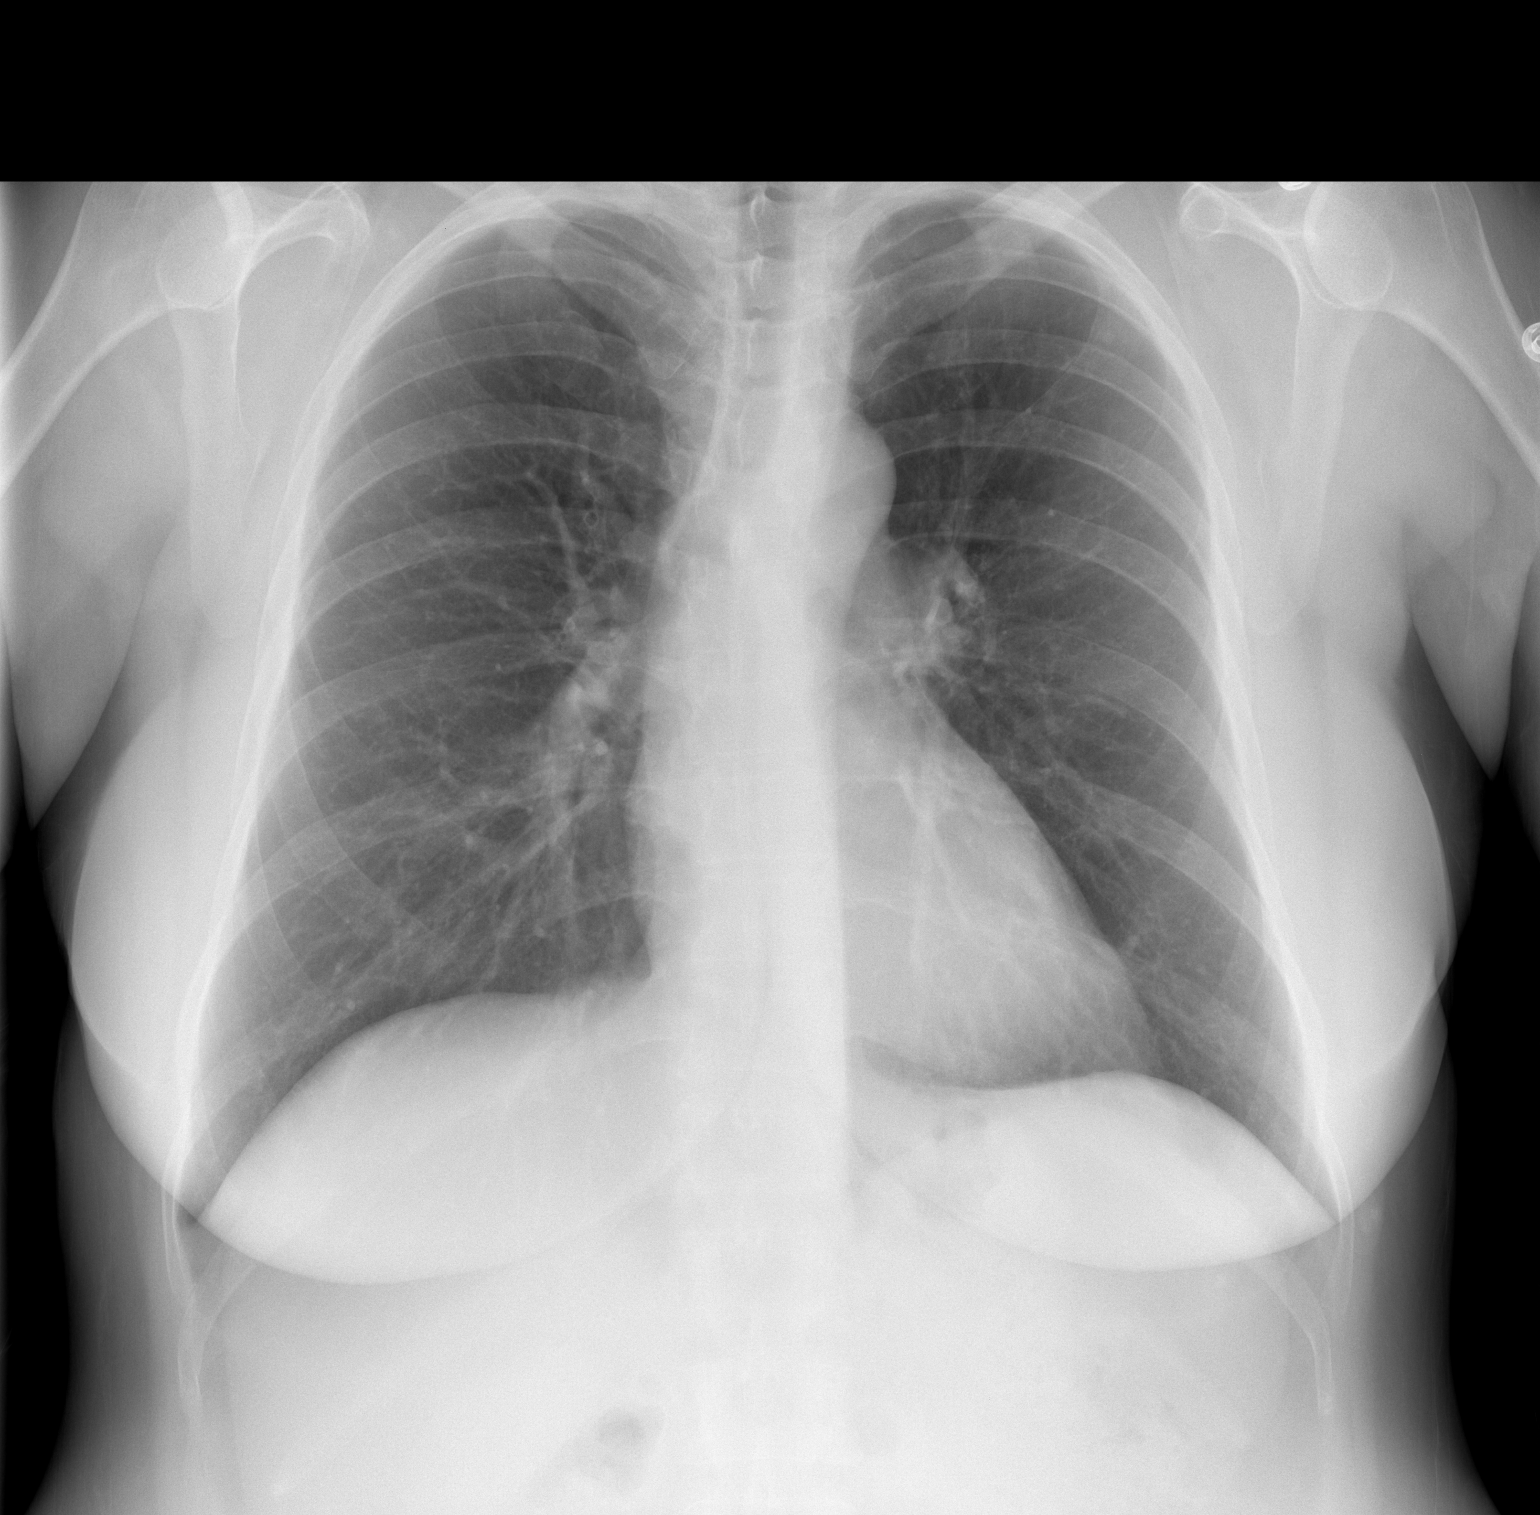

[w chest lat]
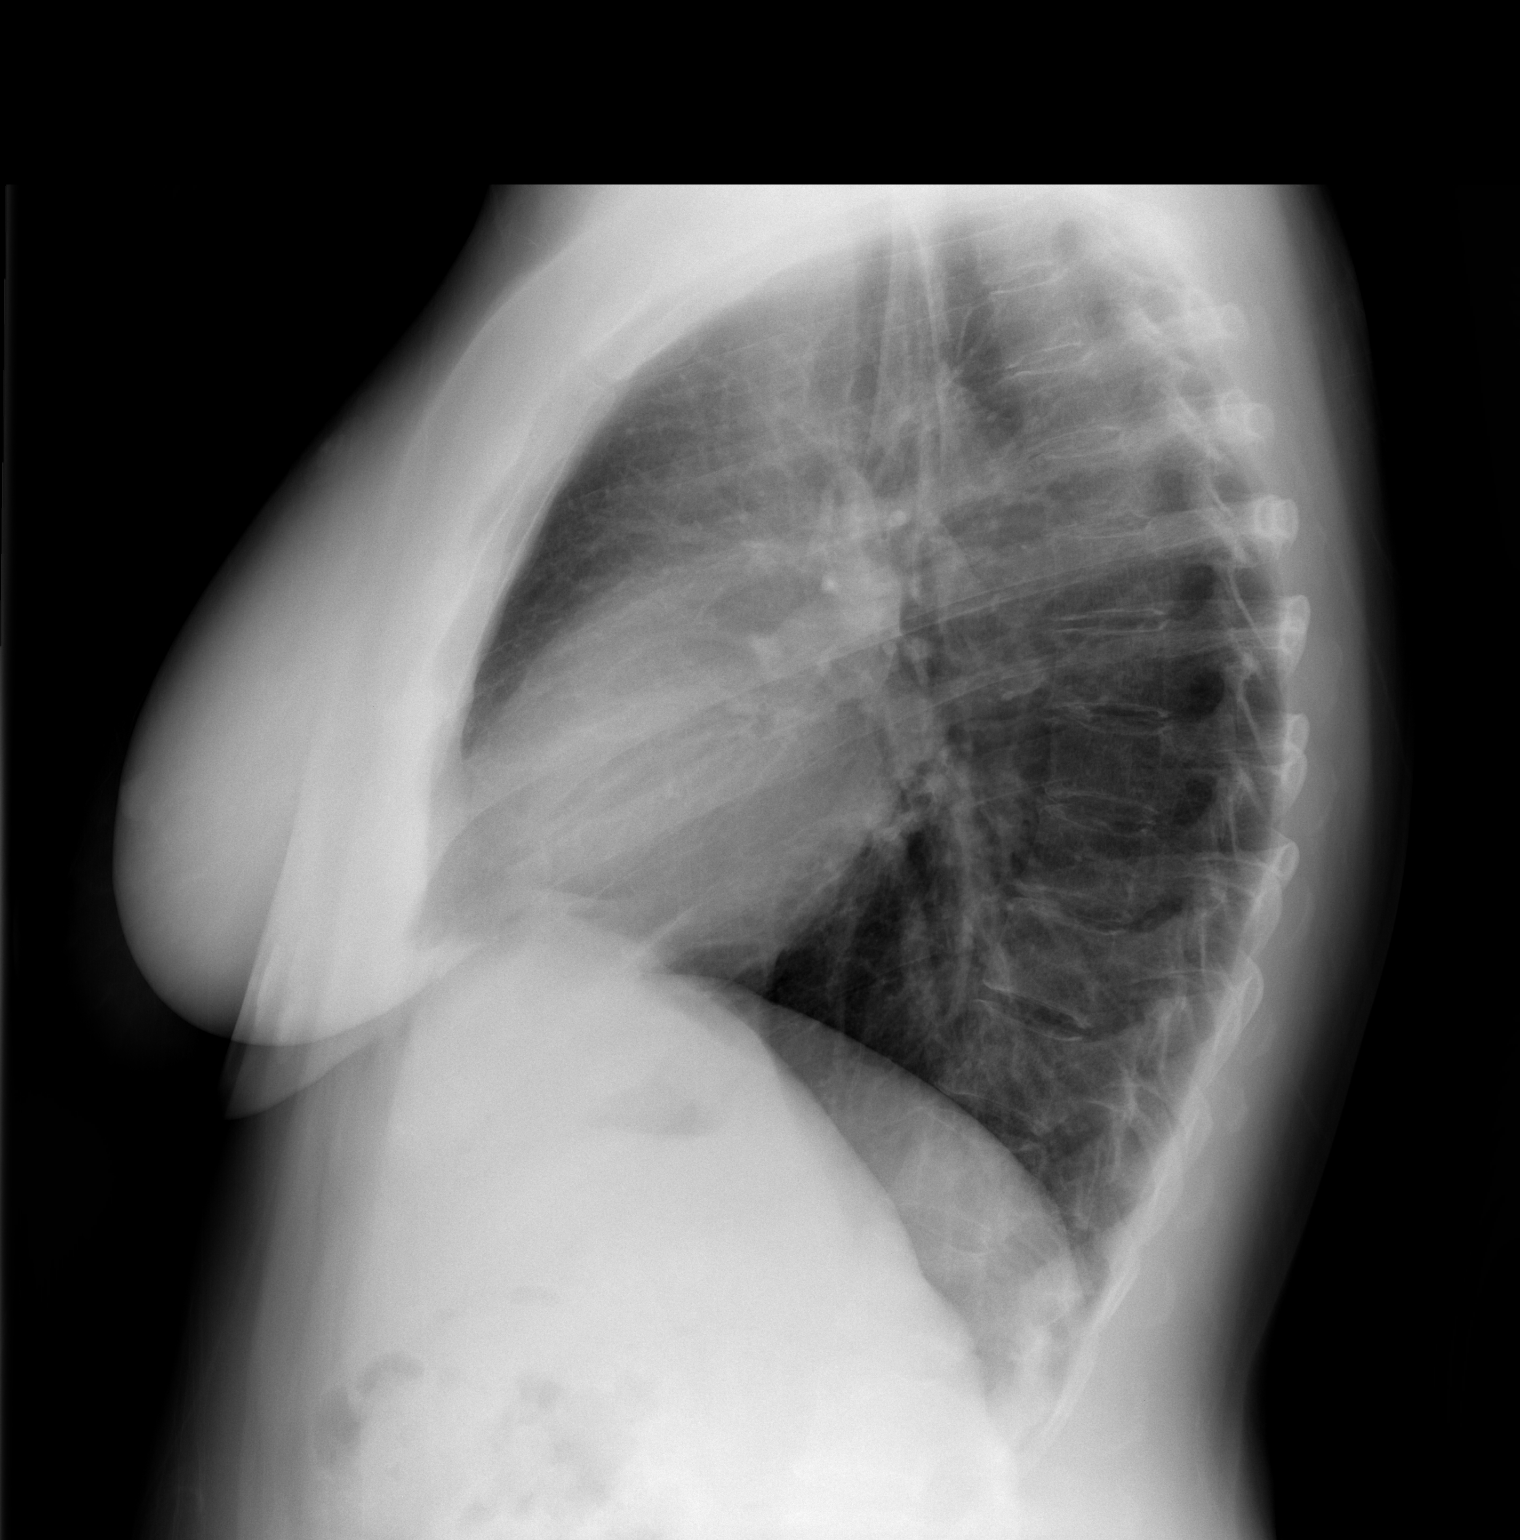

[2 of 2 positions shown; findings below may reference images not displayed]

FINDINGS: The heart size and mediastinal contours are within normal limits.
Both lungs are clear. The visualized skeletal structures are
unremarkable.
IMPRESSION: No active cardiopulmonary disease.

## 2015-11-17 ENCOUNTER — Ambulatory Visit: Payer: Self-pay | Admitting: Family Medicine

## 2015-11-23 ENCOUNTER — Ambulatory Visit (INDEPENDENT_AMBULATORY_CARE_PROVIDER_SITE_OTHER): Payer: BLUE CROSS/BLUE SHIELD | Admitting: Family Medicine

## 2015-11-23 ENCOUNTER — Encounter: Payer: Self-pay | Admitting: Family Medicine

## 2015-11-23 VITALS — BP 120/90 | HR 97 | Temp 98.4°F

## 2015-11-23 DIAGNOSIS — I10 Essential (primary) hypertension: Secondary | ICD-10-CM | POA: Diagnosis not present

## 2015-11-23 MED ORDER — AMPHETAMINE-DEXTROAMPHET ER 30 MG PO CP24: 30.0000 mg | ORAL_CAPSULE | ORAL | Status: DC

## 2015-11-23 NOTE — Progress Notes (Signed)
Pre visit review using our clinic review tool, if applicable. No additional management support is needed unless otherwise documented below in the visit note. 

## 2015-11-23 NOTE — Progress Notes (Signed)
   Subjective:    Patient ID: [name], female    DOB: 03/28/1966, 50 y.o.   MRN: [id number]  HPI Follow-up hypertension. Patient had severe elevation last time here of 170/118. She had stopped taking her blood pressure medication. We started back losartan HCTZ and she feels much better overall. No headaches. No dizziness. No chest pains. No peripheral edema. She has attention deficit disorder and we had held her medication because of her very poorly controlled blood pressure.  She is requesting getting back on Adderall this time. When taking extended release 20 mg she felt that she still had some difficulties with focusing  Past Medical History  Diagnosis Date  . Depression   . Hypertension   . Asthma   . GERD (gastroesophageal reflux disease)   . Hepatitis A   . Jaundice   . Blood transfusion   . Migraines   . UTI (urinary tract infection)   . Ovarian cyst    Past Surgical History  Procedure Laterality Date  . Appendectomy  1994  . Ovarian cyst removal    . Tonsillectomy and adenoidectomy      reports that she has never smoked. She has never used smokeless tobacco. She reports that she does not drink alcohol or use illicit drugs. family history includes Asthma in her son; Breast cancer in her maternal grandmother; Crohn's disease in her father and maternal aunt; Esophageal cancer in her maternal uncle; Hypertension in her other. Allergies  Allergen Reactions  . Aspirin Shortness Of Breath    REACTION: wheezing problems  . Codeine Sulfate Hives and Nausea And Vomiting    REACTION: rash, itiching  . Hydrocodone-Acetaminophen Itching and Rash      Review of Systems  Constitutional: Negative for fatigue.  Eyes: Negative for visual disturbance.  Respiratory: Negative for cough, chest tightness, shortness of breath and wheezing.   Cardiovascular: Negative for chest pain, palpitations and leg swelling.  Endocrine: Negative for polydipsia and polyuria.    Neurological: Negative for dizziness, seizures, syncope, weakness, light-headedness and headaches.       Objective:   Physical Exam  Constitutional: She appears well-developed and well-nourished.  Neck: Neck supple. No thyromegaly present.  Cardiovascular: Normal rate and regular rhythm.   Pulmonary/Chest: Effort normal and breath sounds normal. No respiratory distress. She has no wheezes. She has no rales.  Musculoskeletal: She exhibits no edema.          Assessment & Plan:  #1 hypertension. Improved tremendously. We have recommend more consistent aerobic exercise. Continue close monitoring. Routine follow-up 6 months  #2 attention deficit disorder. We will increase her Adderall XR 30 mg once daily. Prescription written for #30.

## 2015-12-04 ENCOUNTER — Other Ambulatory Visit: Payer: Self-pay | Admitting: Family Medicine

## 2015-12-04 MED ORDER — ALBUTEROL SULFATE HFA 108 (90 BASE) MCG/ACT IN AERS: INHALATION_SPRAY | RESPIRATORY_TRACT | Status: DC

## 2015-12-20 ENCOUNTER — Telehealth: Payer: Self-pay | Admitting: Family Medicine

## 2015-12-20 NOTE — Telephone Encounter (Signed)
Due 12/24/15.

## 2015-12-20 NOTE — Telephone Encounter (Signed)
° ° ° ° ° °  Pt request refill of the following: ° °amphetamine-dextroamphetamine (ADDERALL XR) 30 MG 24 hr capsule ° ° °Phamacy: °

## 2015-12-22 MED ORDER — AMPHETAMINE-DEXTROAMPHET ER 30 MG PO CP24: 30.0000 mg | ORAL_CAPSULE | ORAL | Status: DC

## 2015-12-22 MED ORDER — AMPHETAMINE-DEXTROAMPHET ER 30 MG PO CP24
ORAL_CAPSULE | ORAL | Status: DC
Start: 2015-12-22 — End: 2016-01-09

## 2015-12-22 MED ORDER — AMPHETAMINE-DEXTROAMPHET ER 30 MG PO CP24: ORAL_CAPSULE | ORAL | Status: DC

## 2015-12-22 NOTE — Telephone Encounter (Signed)
Pt is aware via voicemail that RX is up front for pick up. 

## 2015-12-22 NOTE — Telephone Encounter (Signed)
Printed for signature

## 2015-12-22 NOTE — Addendum Note (Signed)
Addended by: Tempie Hoist on: 12/22/2015 08:40 AM   Modules accepted: Orders

## 2016-01-03 ENCOUNTER — Emergency Department (HOSPITAL_COMMUNITY): Payer: BLUE CROSS/BLUE SHIELD

## 2016-01-03 ENCOUNTER — Encounter (HOSPITAL_COMMUNITY): Payer: Self-pay | Admitting: Emergency Medicine

## 2016-01-03 ENCOUNTER — Emergency Department (HOSPITAL_COMMUNITY)
Admission: EM | Admit: 2016-01-03 | Discharge: 2016-01-03 | Disposition: A | Discharge status: Home / Self Care | Payer: BLUE CROSS/BLUE SHIELD | Attending: Emergency Medicine | Admitting: Emergency Medicine

## 2016-01-03 DIAGNOSIS — J069 Acute upper respiratory infection, unspecified: Secondary | ICD-10-CM | POA: Diagnosis not present

## 2016-01-03 DIAGNOSIS — Z8719 Personal history of other diseases of the digestive system: Secondary | ICD-10-CM | POA: Insufficient documentation

## 2016-01-03 DIAGNOSIS — Z8619 Personal history of other infectious and parasitic diseases: Secondary | ICD-10-CM | POA: Diagnosis not present

## 2016-01-03 DIAGNOSIS — Z8742 Personal history of other diseases of the female genital tract: Secondary | ICD-10-CM | POA: Diagnosis not present

## 2016-01-03 DIAGNOSIS — Z79899 Other long term (current) drug therapy: Secondary | ICD-10-CM | POA: Diagnosis not present

## 2016-01-03 DIAGNOSIS — J45901 Unspecified asthma with (acute) exacerbation: Secondary | ICD-10-CM | POA: Diagnosis not present

## 2016-01-03 DIAGNOSIS — F329 Major depressive disorder, single episode, unspecified: Secondary | ICD-10-CM | POA: Diagnosis not present

## 2016-01-03 DIAGNOSIS — I10 Essential (primary) hypertension: Secondary | ICD-10-CM | POA: Insufficient documentation

## 2016-01-03 DIAGNOSIS — Z87440 Personal history of urinary (tract) infections: Secondary | ICD-10-CM | POA: Diagnosis not present

## 2016-01-03 DIAGNOSIS — R05 Cough: Secondary | ICD-10-CM | POA: Diagnosis present

## 2016-01-03 DIAGNOSIS — R062 Wheezing: Secondary | ICD-10-CM

## 2016-01-03 DIAGNOSIS — R001 Bradycardia, unspecified: Secondary | ICD-10-CM | POA: Diagnosis not present

## 2016-01-03 MED ORDER — IPRATROPIUM BROMIDE 0.02 % IN SOLN
0.5000 mg | Freq: Once | RESPIRATORY_TRACT | Status: AC
  Administered 2016-01-03: 0.5 mg via RESPIRATORY_TRACT
  Filled 2016-01-03: qty 2.5

## 2016-01-03 MED ORDER — ALBUTEROL SULFATE (2.5 MG/3ML) 0.083% IN NEBU
5.0000 mg | INHALATION_SOLUTION | Freq: Once | RESPIRATORY_TRACT | Status: AC
  Administered 2016-01-03: 5 mg via RESPIRATORY_TRACT
  Filled 2016-01-03: qty 6

## 2016-01-03 MED ORDER — IPRATROPIUM-ALBUTEROL 0.5-2.5 (3) MG/3ML IN SOLN
3.0000 mL | Freq: Once | RESPIRATORY_TRACT | Status: AC
  Administered 2016-01-03: 3 mL via RESPIRATORY_TRACT
  Filled 2016-01-03: qty 3

## 2016-01-03 MED ORDER — PREDNISONE 20 MG PO TABS
60.0000 mg | ORAL_TABLET | Freq: Once | ORAL | Status: AC
  Administered 2016-01-03: 60 mg via ORAL
  Filled 2016-01-03: qty 3

## 2016-01-03 MED ORDER — PREDNISONE 20 MG PO TABS: ORAL_TABLET | ORAL | Status: DC

## 2016-01-03 NOTE — ED Notes (Signed)
Bed: WA26 Expected date:  Expected time:  Means of arrival:  Comments: 

## 2016-01-03 NOTE — ED Notes (Signed)
Per pt, states cold sym[ptoms for a week-had a coughing spell which caused her asthma to get worse

## 2016-01-03 NOTE — Discharge Instructions (Signed)
Continue to stay well-hydrated. Continue to alternate between Tylenol and Ibuprofen for pain or fever. Use Mucinex for cough suppression/expectoration of mucus. Use netipot and flonase to help with nasal congestion. May consider over-the-counter Benadryl or other antihistamine to decrease secretions and for watery itchy eyes. Use home inhaler as directed, as needed for cough/chest congestion. Take prednisone as directed. Followup with your primary care doctor in 5-7 days for recheck of ongoing symptoms. Return to emergency department for emergent changing or worsening of symptoms.   Asthma, Acute Bronchospasm Acute bronchospasm caused by asthma is also referred to as an asthma attack. Bronchospasm means your air passages become narrowed. The narrowing is caused by inflammation and tightening of the muscles in the air tubes (bronchi) in your lungs. This can make it hard to breathe or cause you to wheeze and cough. CAUSES Possible triggers are:  Animal dander from the skin, hair, or feathers of animals.  Dust mites contained in house dust.  Cockroaches.  Pollen from trees or grass.  Mold.  Cigarette or tobacco smoke.  Air pollutants such as dust, household cleaners, hair sprays, aerosol sprays, paint fumes, strong chemicals, or strong odors.  Cold air or weather changes. Cold air may trigger inflammation. Winds increase molds and pollens in the air.  Strong emotions such as crying or laughing hard.  Stress.  Certain medicines such as aspirin or beta-blockers.  Sulfites in foods and drinks, such as dried fruits and wine.  Infections or inflammatory conditions, such as a flu, cold, or inflammation of the nasal membranes (rhinitis).  Gastroesophageal reflux disease (GERD). GERD is a condition where stomach acid backs up into your esophagus.  Exercise or strenuous activity. SIGNS AND SYMPTOMS   Wheezing.  Excessive coughing, particularly at night.  Chest tightness.  Shortness  of breath. DIAGNOSIS  Your health care provider will ask you about your medical history and perform a physical exam. A chest X-ray or blood testing may be performed to look for other causes of your symptoms or other conditions that may have triggered your asthma attack. TREATMENT  Treatment is aimed at reducing inflammation and opening up the airways in your lungs. Most asthma attacks are treated with inhaled medicines. These include quick relief or rescue medicines (such as bronchodilators) and controller medicines (such as inhaled corticosteroids). These medicines are sometimes given through an inhaler or a nebulizer. Systemic steroid medicine taken by mouth or given through an IV tube also can be used to reduce the inflammation when an attack is moderate or severe. Antibiotic medicines are only used if a bacterial infection is present.  HOME CARE INSTRUCTIONS   Rest.  Drink plenty of liquids. This helps the mucus to remain thin and be easily coughed up. Only use caffeine in moderation and do not use alcohol until you have recovered from your illness.  Do not smoke. Avoid being exposed to secondhand smoke.  You play a critical role in keeping yourself in good health. Avoid exposure to things that cause you to wheeze or to have breathing problems.  Keep your medicines up-to-date and available. Carefully follow your health care provider's treatment plan.  Take your medicine exactly as prescribed.  When pollen or pollution is bad, keep windows closed and use an air conditioner or go to places with air conditioning.  Asthma requires careful medical care. See your health care provider for a follow-up as advised. If you are more than [redacted] weeks pregnant and you were prescribed any new medicines, let your obstetrician know about  the visit and how you are doing. Follow up with your health care provider as directed.  After you have recovered from your asthma attack, make an appointment with your  outpatient doctor to talk about ways to reduce the likelihood of future attacks. If you do not have a doctor who manages your asthma, make an appointment with a primary care doctor to discuss your asthma. SEEK IMMEDIATE MEDICAL CARE IF:   You are getting worse.  You have trouble breathing. If severe, call your local emergency services (911 in the [place]  You develop chest pain or discomfort.  You are vomiting.  You are not able to keep fluids down.  You are coughing up yellow, green, brown, or bloody sputum.  You have a fever and your symptoms suddenly get worse.  You have trouble swallowing. MAKE SURE YOU:   Understand these instructions.  Will watch your condition.  Will get help right away if you are not doing well or get worse.   This information is not intended to replace advice given to you by your health care provider. Make sure you discuss any questions you have with your health care provider.   Document Released: 02/05/2007 Document Revised: 10/26/2013 Document Reviewed: 04/28/2013 Elsevier Interactive Patient Education 2016 Elsevier Inc.  Cough, Adult A cough helps to clear your throat and lungs. A cough may last only 2-3 weeks (acute), or it may last longer than 8 weeks (chronic). Many different things can cause a cough. A cough may be a sign of an illness or another medical condition. HOME CARE  Pay attention to any changes in your cough.  Take medicines only as told by your doctor.  If you were prescribed an antibiotic medicine, take it as told by your doctor. Do not stop taking it even if you start to feel better.  Talk with your doctor before you try using a cough medicine.  Drink enough fluid to keep your pee (urine) clear or pale yellow.  If the air is dry, use a cold steam vaporizer or humidifier in your home.  Stay away from things that make you cough at work or at home.  If your cough is worse at night, try using extra pillows to raise your head  up higher while you sleep.  Do not smoke, and try not to be around smoke. If you need help quitting, ask your doctor.  Do not have caffeine.  Do not drink alcohol.  Rest as needed. GET HELP IF:  You have new problems (symptoms).  You cough up yellow fluid (pus).  Your cough does not get better after 2-3 weeks, or your cough gets worse.  Medicine does not help your cough and you are not sleeping well.  You have pain that gets worse or pain that is not helped with medicine.  You have a fever.  You are losing weight and you do not know why.  You have night sweats. GET HELP RIGHT AWAY IF:  You cough up blood.  You have trouble breathing.  Your heartbeat is very fast.   This information is not intended to replace advice given to you by your health care provider. Make sure you discuss any questions you have with your health care provider.   Document Released: 07/04/2011 Document Revised: 07/12/2015 Document Reviewed: 12/28/2014 Elsevier Interactive Patient Education 2016 Elsevier Inc.  Upper Respiratory Infection, Adult Most upper respiratory infections (URIs) are caused by a virus. A URI affects the nose, throat, and upper air passages. The  most common type of URI is often called "the common cold." HOME CARE   Take medicines only as told by your doctor.  Gargle warm saltwater or take cough drops to comfort your throat as told by your doctor.  Use a warm mist humidifier or inhale steam from a shower to increase air moisture. This may make it easier to breathe.  Drink enough fluid to keep your pee (urine) clear or pale yellow.  Eat soups and other clear broths.  Have a healthy diet.  Rest as needed.  Go back to work when your fever is gone or your doctor says it is okay.  You may need to stay home longer to avoid giving your URI to others.  You can also wear a face mask and wash your hands often to prevent spread of the virus.  Use your inhaler more if you  have asthma.  Do not use any tobacco products, including cigarettes, chewing tobacco, or electronic cigarettes. If you need help quitting, ask your doctor. GET HELP IF:  You are getting worse, not better.  Your symptoms are not helped by medicine.  You have chills.  You are getting more short of breath.  You have brown or red mucus.  You have yellow or brown discharge from your nose.  You have pain in your face, especially when you bend forward.  You have a fever.  You have puffy (swollen) neck glands.  You have pain while swallowing.  You have white areas in the back of your throat. GET HELP RIGHT AWAY IF:   You have very bad or constant:  Headache.  Ear pain.  Pain in your forehead, behind your eyes, and over your cheekbones (sinus pain).  Chest pain.  You have long-lasting (chronic) lung disease and any of the following:  Wheezing.  Long-lasting cough.  Coughing up blood.  A change in your usual mucus.  You have a stiff neck.  You have changes in your:  Vision.  Hearing.  Thinking.  Mood. MAKE SURE YOU:   Understand these instructions.  Will watch your condition.  Will get help right away if you are not doing well or get worse.   This information is not intended to replace advice given to you by your health care provider. Make sure you discuss any questions you have with your health care provider.   Document Released: 04/08/2008 Document Revised: 03/07/2015 Document Reviewed: 01/26/2014 Elsevier Interactive Patient Education [employer]

## 2016-01-03 NOTE — ED Provider Notes (Signed)
CSN: [id number]     Arrival date & time 01/03/16  0845 History   First MD Initiated Contact with Patient 01/03/16 1006     Chief Complaint  Patient presents with  . Asthma     (Consider location/radiation/quality/duration/timing/severity/associated sxs/prior Treatment) HPI Comments: [name] is a 50 y.o. female with a PMHx of depression, HTN, asthma, GERD, hepA, migraines, and ovarian cysts, who presents to the ED with complaints of asthma exacerbation. Patient states that for the last week she has had an upper respiratory infection which is gradually improving, but she had a coughing spell this morning after which she felt short of breath and had some wheezing. Upon arrival she was given a nebulizer treatment which drastically improved her symptoms. She states her cough has been somewhat productive of an unknown color, but is improving. She has had some sinus congestion which is also improving. Her wheezing and shortness of breath are currently resolved. Positive sick contacts at home. She denies any fevers, chills, ear pain or drainage, rhinorrhea, sore throat, chest pain, ongoing shortness breath, leg swelling, recent travel/surgery/immobilization, abdominal pain, nausea, vomiting, diarrhea, constipation, dysuria, hematuria, numbness, tingling, or focal weakness. She is a nonsmoker.  Patient is a 50 y.o. female presenting with asthma. The history is provided by the patient and medical records. No language interpreter was used.  Asthma This is a chronic problem. The current episode started today. The problem occurs intermittently. The problem has been gradually improving. Associated symptoms include coughing. Pertinent negatives include no abdominal pain, arthralgias, chest pain, chills, fever, myalgias, nausea, numbness, sore throat, urinary symptoms, vomiting or weakness. The symptoms are aggravated by coughing. Treatments tried: nebulizer. The treatment provided significant relief.     Past Medical History  Diagnosis Date  . Depression   . Hypertension   . Asthma   . GERD (gastroesophageal reflux disease)   . Hepatitis A   . Jaundice   . Blood transfusion   . Migraines   . UTI (urinary tract infection)   . Ovarian cyst    Past Surgical History  Procedure Laterality Date  . Appendectomy  1994  . Ovarian cyst removal    . Tonsillectomy and adenoidectomy     Family History  Problem Relation Age of Onset  . Asthma Son   . Hypertension Other   . Crohn's disease Father   . Crohn's disease Maternal Aunt   . Esophageal cancer Maternal Uncle   . Breast cancer Maternal Grandmother    Social History  Substance Use Topics  . Smoking status: Never Smoker   . Smokeless tobacco: Never Used  . Alcohol Use: No   OB History    No data available     Review of Systems  Constitutional: Negative for fever and chills.  HENT: Positive for sinus pressure (improving). Negative for ear discharge, ear pain, rhinorrhea and sore throat.   Respiratory: Positive for cough, shortness of breath (improved) and wheezing.   Cardiovascular: Negative for chest pain and leg swelling.  Gastrointestinal: Negative for nausea, vomiting, abdominal pain, diarrhea and constipation.  Genitourinary: Negative for dysuria and hematuria.  Musculoskeletal: Negative for myalgias and arthralgias.  Skin: Negative for color change.  Allergic/Immunologic: Negative for immunocompromised state.  Neurological: Negative for weakness and numbness.  Psychiatric/Behavioral: Negative for confusion.   10 Systems reviewed and are negative for acute change except as noted in the HPI.    Allergies  Aspirin; Codeine sulfate; and Hydrocodone-acetaminophen  Home Medications   Prior to Admission medications  Medication Sig Start Date End Date Taking? Authorizing Provider  albuterol (PROVENTIL) (2.5 MG/3ML) 0.083% nebulizer solution Take 3 mLs (2.5 mg total) by nebulization every 6 (six) hours as needed  for wheezing or shortness of breath. 09/12/14   Arby Barrette, MD  albuterol (VENTOLIN HFA) 108 (90 Base) MCG/ACT inhaler INHALE 2 TO 3 PUFFS BY MOUTH EVERY 6 HOURS AS NEEDED FOR WHEEZING OR SHORTNESS OF BREATH 12/04/15   Kristian Covey, MD  amphetamine-dextroamphetamine (ADDERALL XR) 30 MG 24 hr capsule Take 1 capsule (30 mg total) by mouth every morning. 12/22/15   Kristian Covey, MD  amphetamine-dextroamphetamine (ADDERALL XR) 30 MG 24 hr capsule Take 1 capsule (30 mg total) by mouth every morning. May refill in one month. 12/22/15   Kristian Covey, MD  amphetamine-dextroamphetamine (ADDERALL XR) 30 MG 24 hr capsule Take 1 capsule (30 mg total) by mouth every morning. May refill in two month. 12/22/15   Kristian Covey, MD  ipratropium (ATROVENT) 0.02 % nebulizer solution Take 2.5 mLs (0.5 mg total) by nebulization 4 (four) times daily. Patient taking differently: Take 0.5 mg by nebulization every 6 (six) hours as needed for wheezing or shortness of breath.  09/12/14   Arby Barrette, MD  losartan-hydrochlorothiazide (HYZAAR) 100-12.5 MG tablet Take 1 tablet by mouth daily. 10/20/15   Kristian Covey, MD  sertraline (ZOLOFT) 100 MG tablet Take 1 tablet (100 mg total) by mouth daily. 10/03/15   Kristian Covey, MD   BP 159/104 mmHg  Pulse 47  Temp(Src) 98.4 F (36.9 C) (Oral)  Resp 18  SpO2 99%  LMP 01/03/2016 Physical Exam  Constitutional: She is oriented to person, place, and time. Vital signs are normal. She appears well-developed and well-nourished.  Non-toxic appearance. No distress.  Afebrile, nontoxic, NAD  HENT:  Head: Normocephalic and atraumatic.  Mouth/Throat: Oropharynx is clear and moist and mucous membranes are normal.  Eyes: Conjunctivae and EOM are normal. Right eye exhibits no discharge. Left eye exhibits no discharge.  Neck: Normal range of motion. Neck supple.  Cardiovascular: Regular rhythm, normal heart sounds and intact distal pulses.  Bradycardia present.   Exam reveals no gallop and no friction rub.   No murmur heard. Pulmonary/Chest: Effort normal. No respiratory distress. She has no decreased breath sounds. She has wheezes. She has no rhonchi. She has no rales.  Prolonged expiration, with diffuse expiratory wheezing throughout but most notable in the lower lung fields, no rhonchi or rales, no hypoxia or increased WOB, speaking in full sentences, SpO2 99% on RA   Abdominal: Soft. Normal appearance and bowel sounds are normal. She exhibits no distension. There is no tenderness. There is no rigidity, no rebound, no guarding, no CVA tenderness, no tenderness at McBurney's point and negative Murphy's sign.  Musculoskeletal: Normal range of motion.  Neurological: She is alert and oriented to person, place, and time. She has normal strength. No sensory deficit.  Skin: Skin is warm, dry and intact. No rash noted.  Psychiatric: She has a normal mood and affect.  Nursing note and vitals reviewed.   ED Course  Procedures (including critical care time) Labs Review Labs Reviewed - No data to display  Imaging Review Dg Chest 2 View  01/03/2016  CLINICAL DATA:  Cough and shortness of breath. EXAM: CHEST  2 VIEW COMPARISON:  02/19/2015 FINDINGS: The heart size and mediastinal contours are within normal limits. Both lungs are clear. Slight thoracic scoliosis, stable. IMPRESSION: No active cardiopulmonary disease. Electronically Signed   By:  Francene Boyers M.D.   On: 01/03/2016 09:43   I have personally reviewed and evaluated these images and lab results as part of my medical decision-making.   EKG Interpretation None      MDM   Final diagnoses:  Asthma exacerbation  URI (upper respiratory infection)  Wheezing    50 y.o. female here with asthma exacerbation. States she's getting over a cold x1wk, had coughing spell this morning which caused some wheezing and SOB. Had neb tx prior to my arrival which improved her symptoms. On exam, still having  wheezing and prolonged expiration, will give another neb. No rhonchi or focal consolidation, but CXR was obtained prior to my arrival, which was neg. Likely viral URI. Will reassess after second neb and prednisone.  11:11 AM Lung sounds greatly improved. Will d/c home with pred taper. Pt has home inhalers. F/up with PCP in 1wk. I explained the diagnosis and have given explicit precautions to return to the ER including for any other new or worsening symptoms. The patient understands and accepts the medical plan as it's been dictated and I have answered their questions. Discharge instructions concerning home care and prescriptions have been given. The patient is STABLE and is discharged to home in good condition.  BP 159/104 mmHg  Pulse 47  Temp(Src) 98.4 F (36.9 C) (Oral)  Resp 18  SpO2 99%  LMP 01/03/2016  Meds ordered this encounter  Medications  . ipratropium-albuterol (DUONEB) 0.5-2.5 (3) MG/3ML nebulizer solution 3 mL    Sig:   . albuterol (PROVENTIL) (2.5 MG/3ML) 0.083% nebulizer solution 5 mg    Sig:   . ipratropium (ATROVENT) nebulizer solution 0.5 mg    Sig:   . predniSONE (DELTASONE) tablet 60 mg    Sig:   . predniSONE (DELTASONE) 20 MG tablet    Sig: 3 tabs po daily x 3 days beginning 01/04/16    Dispense:  9 tablet    Refill:  0    Order Specific Question:  Supervising Provider    Answer:  Angus Seller Camprubi-Soms, PA-C 01/03/16 1112  Mancel Bale, MD 01/03/16 1635

## 2016-01-09 ENCOUNTER — Other Ambulatory Visit: Payer: Self-pay | Admitting: Family Medicine

## 2016-01-09 ENCOUNTER — Emergency Department (HOSPITAL_BASED_OUTPATIENT_CLINIC_OR_DEPARTMENT_OTHER): Payer: BLUE CROSS/BLUE SHIELD

## 2016-01-09 ENCOUNTER — Encounter (HOSPITAL_BASED_OUTPATIENT_CLINIC_OR_DEPARTMENT_OTHER): Payer: Self-pay

## 2016-01-09 ENCOUNTER — Emergency Department (HOSPITAL_BASED_OUTPATIENT_CLINIC_OR_DEPARTMENT_OTHER)
Admission: EM | Admit: 2016-01-09 | Discharge: 2016-01-09 | Disposition: A | Discharge status: Home / Self Care | Payer: BLUE CROSS/BLUE SHIELD | Attending: Emergency Medicine | Admitting: Emergency Medicine

## 2016-01-09 DIAGNOSIS — J45901 Unspecified asthma with (acute) exacerbation: Secondary | ICD-10-CM

## 2016-01-09 DIAGNOSIS — Z8742 Personal history of other diseases of the female genital tract: Secondary | ICD-10-CM | POA: Insufficient documentation

## 2016-01-09 DIAGNOSIS — Z79899 Other long term (current) drug therapy: Secondary | ICD-10-CM | POA: Diagnosis not present

## 2016-01-09 DIAGNOSIS — Z8619 Personal history of other infectious and parasitic diseases: Secondary | ICD-10-CM | POA: Diagnosis not present

## 2016-01-09 DIAGNOSIS — I10 Essential (primary) hypertension: Secondary | ICD-10-CM | POA: Diagnosis not present

## 2016-01-09 DIAGNOSIS — R05 Cough: Secondary | ICD-10-CM

## 2016-01-09 DIAGNOSIS — Z87440 Personal history of urinary (tract) infections: Secondary | ICD-10-CM | POA: Insufficient documentation

## 2016-01-09 DIAGNOSIS — F329 Major depressive disorder, single episode, unspecified: Secondary | ICD-10-CM | POA: Insufficient documentation

## 2016-01-09 DIAGNOSIS — R059 Cough, unspecified: Secondary | ICD-10-CM

## 2016-01-09 DIAGNOSIS — Z8719 Personal history of other diseases of the digestive system: Secondary | ICD-10-CM | POA: Diagnosis not present

## 2016-01-09 MED ORDER — PREDNISONE 10 MG PO TABS
60.0000 mg | ORAL_TABLET | Freq: Once | ORAL | Status: AC
  Administered 2016-01-09: 60 mg via ORAL
  Filled 2016-01-09: qty 1

## 2016-01-09 MED ORDER — PREDNISONE 50 MG PO TABS: 50.0000 mg | ORAL_TABLET | Freq: Every day | ORAL | Status: DC

## 2016-01-09 MED ORDER — MONTELUKAST SODIUM 10 MG PO TABS: 10.0000 mg | ORAL_TABLET | Freq: Every day | ORAL | Status: DC

## 2016-01-09 MED ORDER — IPRATROPIUM-ALBUTEROL 0.5-2.5 (3) MG/3ML IN SOLN
3.0000 mL | Freq: Once | RESPIRATORY_TRACT | Status: AC
  Administered 2016-01-09: 3 mL via RESPIRATORY_TRACT
  Filled 2016-01-09: qty 3

## 2016-01-09 MED ORDER — IPRATROPIUM-ALBUTEROL 0.5-2.5 (3) MG/3ML IN SOLN
3.0000 mL | Freq: Four times a day (QID) | RESPIRATORY_TRACT | Status: DC
  Administered 2016-01-09: 3 mL via RESPIRATORY_TRACT
  Filled 2016-01-09: qty 3

## 2016-01-09 MED ORDER — ALBUTEROL SULFATE HFA 108 (90 BASE) MCG/ACT IN AERS
2.0000 | INHALATION_SPRAY | RESPIRATORY_TRACT | Status: DC | PRN
  Administered 2016-01-09: 2 via RESPIRATORY_TRACT
  Filled 2016-01-09: qty 6.7

## 2016-01-09 MED ORDER — ALBUTEROL (5 MG/ML) CONTINUOUS INHALATION SOLN
10.0000 mg/h | INHALATION_SOLUTION | RESPIRATORY_TRACT | Status: AC
  Administered 2016-01-09: 10 mg/h via RESPIRATORY_TRACT
  Filled 2016-01-09: qty 20

## 2016-01-09 MED FILL — predniSONE 50 MG TABS: 50 | 4 days supply | Qty: 4 | Fill #0

## 2016-01-09 NOTE — ED Notes (Signed)
Has hx of asthma, presents with cough today, asthma type symptoms

## 2016-01-09 NOTE — ED Notes (Signed)
Rad Tech states patient is questioning why she needs an xray because had an xray a couple of days ago. EDP to be notified.

## 2016-01-09 NOTE — ED Notes (Signed)
Exp wheezing appears more prominent at this time, pt has some DOE also noted, EDP informed

## 2016-01-09 NOTE — ED Notes (Signed)
DC instructions reviewed with pt, RT instructed on use of MDI with use of spacer, Opportunity for questions provided, also reviewed Rx as written by EDP

## 2016-01-09 NOTE — Telephone Encounter (Signed)
Pt request refill of the following: albuterol (VENTOLIN HFA) 108 (90 Base) MCG/ACT inhaler   Phamacy:  [facility]

## 2016-01-09 NOTE — Discharge Instructions (Signed)
Asthma, Adult Asthma is a recurring condition in which the airways tighten and narrow. Asthma can make it difficult to breathe. It can cause coughing, wheezing, and shortness of breath. Asthma episodes, also called asthma attacks, range from minor to life-threatening. Asthma cannot be cured, but medicines and lifestyle changes can help control it. CAUSES Asthma is believed to be caused by inherited (genetic) and environmental factors, but its exact cause is unknown. Asthma may be triggered by allergens, lung infections, or irritants in the air. Asthma triggers are different for each person. Common triggers include:   Animal dander.  Dust mites.  Cockroaches.  Pollen from trees or grass.  Mold.  Smoke.  Air pollutants such as dust, household cleaners, hair sprays, aerosol sprays, paint fumes, strong chemicals, or strong odors.  Cold air, weather changes, and winds (which increase molds and pollens in the air).  Strong emotional expressions such as crying or laughing hard.  Stress.  Certain medicines (such as aspirin) or types of drugs (such as beta-blockers).  Sulfites in foods and drinks. Foods and drinks that may contain sulfites include dried fruit, potato chips, and sparkling grape juice.  Infections or inflammatory conditions such as the flu, a cold, or an inflammation of the nasal membranes (rhinitis).  Gastroesophageal reflux disease (GERD).  Exercise or strenuous activity. SYMPTOMS Symptoms may occur immediately after asthma is triggered or many hours later. Symptoms include:  Wheezing.  Excessive nighttime or early morning coughing.  Frequent or severe coughing with a common cold.  Chest tightness.  Shortness of breath. DIAGNOSIS  The diagnosis of asthma is made by a review of your medical history and a physical exam. Tests may also be performed. These may include:  Lung function studies. These tests show how much air you breathe in and out.  Allergy  tests.  Imaging tests such as X-rays. TREATMENT  Asthma cannot be cured, but it can usually be controlled. Treatment involves identifying and avoiding your asthma triggers. It also involves medicines. There are 2 classes of medicine used for asthma treatment:   Controller medicines. These prevent asthma symptoms from occurring. They are usually taken every day.  Reliever or rescue medicines. These quickly relieve asthma symptoms. They are used as needed and provide short-term relief. Your health care provider will help you create an asthma action plan. An asthma action plan is a written plan for managing and treating your asthma attacks. It includes a list of your asthma triggers and how they may be avoided. It also includes information on when medicines should be taken and when their dosage should be changed. An action plan may also involve the use of a device called a peak flow meter. A peak flow meter measures how well the lungs are working. It helps you monitor your condition. HOME CARE INSTRUCTIONS   Take medicines only as directed by your health care provider. Speak with your health care provider if you have questions about how or when to take the medicines.  Use a peak flow meter as directed by your health care provider. Record and keep track of readings.  Understand and use the action plan to help minimize or stop an asthma attack without needing to seek medical care.  Control your home environment in the following ways to help prevent asthma attacks:  Do not smoke. Avoid being exposed to secondhand smoke.  Change your heating and air conditioning filter regularly.  Limit your use of fireplaces and wood stoves.  Get rid of pests (such as roaches   and mice) and their droppings.  Throw away plants if you see mold on them.  Clean your floors and dust regularly. Use unscented cleaning products.  Try to have someone else vacuum for you regularly. Stay out of rooms while they are  being vacuumed and for a short while afterward. If you vacuum, use a dust mask from a hardware store, a double-layered or microfilter vacuum cleaner bag, or a vacuum cleaner with a HEPA filter.  Replace carpet with wood, tile, or vinyl flooring. Carpet can trap dander and dust.  Use allergy-proof pillows, mattress covers, and box spring covers.  Wash bed sheets and blankets every week in hot water and dry them in a dryer.  Use blankets that are made of polyester or cotton.  Clean bathrooms and kitchens with bleach. If possible, have someone repaint the walls in these rooms with mold-resistant paint. Keep out of the rooms that are being cleaned and painted.  Wash hands frequently. SEEK MEDICAL CARE IF:   You have wheezing, shortness of breath, or a cough even if taking medicine to prevent attacks.  The colored mucus you cough up (sputum) is thicker than usual.  Your sputum changes from clear or white to yellow, green, gray, or bloody.  You have any problems that may be related to the medicines you are taking (such as a rash, itching, swelling, or trouble breathing).  You are using a reliever medicine more than 2-3 times per week.  Your peak flow is still at 50-79% of your personal best after following your action plan for 1 hour.  You have a fever. SEEK IMMEDIATE MEDICAL CARE IF:   You seem to be getting worse and are unresponsive to treatment during an asthma attack.  You are short of breath even at rest.  You get short of breath when doing very S[place]urzel physical activity.  You have difficulty eating, drinking, or talking due to asthma symptoms.  You develop chest pain.  You develop a fast heartbeat.  You have a bluish color to your lips or fingernails.  You are light-headed, dizzy, or faint.  Your peak flow is less than 50% of your personal best.   This information is not intended to replace advice given to you by your health care provider. Make sure you discuss any  questions you have with your health care provider.   Document Released: 10/21/2005 Document Revised: 07/12/2015 Document Reviewed: 05/20/2013 Elsevier Interactive Patient Education 2016 [employer]  

## 2016-01-09 NOTE — ED Provider Notes (Signed)
CSN: [id number]     Arrival date & time 01/09/16  1354 History   First MD Initiated Contact with Patient 01/09/16 1545     Chief Complaint  Patient presents with  . Cough     (Consider location/radiation/quality/duration/timing/severity/associated sxs/prior Treatment) HPI Comments: 50 year old female with past medical history including asthma who presents with cough and wheezing. The patient states that 2 days ago she developed a viral syndrome which included cough, congestion, and wheezing. She was evaluated approximately one week ago and given steroids and albuterol. She states she had mild improvement but her symptoms of wheezing and cough had persisted despite improvement in the viral syndrome. Wheezing is worse at night. She has been using albuterol scheduled every 4 hours without much relief. She denies any fevers, vomiting, diarrhea. She felt more short of breath today which is why she presents here. She follows with a pulmonologist but has not seen them in a while. She used to be on Singulair and Spiriva but has been off of these medications for quite some time with adequate control of her asthma.  Patient is a 50 y.o. female presenting with cough. The history is provided by the patient.  Cough   Past Medical History  Diagnosis Date  . Depression   . Hypertension   . Asthma   . GERD (gastroesophageal reflux disease)   . Hepatitis A   . Jaundice   . Blood transfusion   . Migraines   . UTI (urinary tract infection)   . Ovarian cyst    Past Surgical History  Procedure Laterality Date  . Appendectomy  1994  . Ovarian cyst removal    . Tonsillectomy and adenoidectomy     Family History  Problem Relation Age of Onset  . Asthma Son   . Hypertension Other   . Crohn's disease Father   . Crohn's disease Maternal Aunt   . Esophageal cancer Maternal Uncle   . Breast cancer Maternal Grandmother    Social History  Substance Use Topics  . Smoking status: Never Smoker   .  Smokeless tobacco: Never Used  . Alcohol Use: No   OB History    No data available     Review of Systems  Respiratory: Positive for cough.    10 Systems reviewed and are negative for acute change except as noted in the HPI.    Allergies  Aspirin; Codeine sulfate; and Hydrocodone-acetaminophen  Home Medications   Prior to Admission medications   Medication Sig Start Date End Date Taking? Authorizing Provider  albuterol (PROVENTIL) (2.5 MG/3ML) 0.083% nebulizer solution Take 3 mLs (2.5 mg total) by nebulization every 6 (six) hours as needed for wheezing or shortness of breath. 09/12/14   Arby BarretteMarcy Pfeiffer, MD  albuterol (VENTOLIN HFA) 108 (90 Base) MCG/ACT inhaler INHALE 2 TO 3 PUFFS BY MOUTH EVERY 6 HOURS AS NEEDED FOR WHEEZING OR SHORTNESS OF BREATH 12/04/15   Kristian CoveyBruce W Burchette, MD  amphetamine-dextroamphetamine (ADDERALL XR) 30 MG 24 hr capsule Take 1 capsule (30 mg total) by mouth every morning. May refill in two month. 12/22/15   Kristian CoveyBruce W Burchette, MD  ipratropium (ATROVENT) 0.02 % nebulizer solution Take 2.5 mLs (0.5 mg total) by nebulization 4 (four) times daily. Patient taking differently: Take 0.5 mg by nebulization every 6 (six) hours as needed for wheezing or shortness of breath.  09/12/14   Arby BarretteMarcy Pfeiffer, MD  losartan-hydrochlorothiazide (HYZAAR) 100-12.5 MG tablet Take 1 tablet by mouth daily. 10/20/15   Kristian CoveyBruce W Burchette, MD  montelukast (  SINGULAIR) 10 MG tablet Take 1 tablet (10 mg total) by mouth at bedtime. 01/09/16   Laurence Spates, MD  predniSONE (DELTASONE) 50 MG tablet Take 1 tablet (50 mg total) by mouth daily. 01/09/16   Laurence Spates, MD  sertraline (ZOLOFT) 100 MG tablet Take 1 tablet (100 mg total) by mouth daily. 10/03/15   Kristian Covey, MD   BP 143/95 mmHg  Pulse 97  Temp(Src) 98.6 F (37 C) (Oral)  Resp 22  SpO2 100%  LMP 01/03/2016 Physical Exam  Constitutional: She is oriented to person, place, and time. She appears well-developed and  well-nourished. No distress.  HENT:  Head: Normocephalic and atraumatic.  Moist mucous membranes  Eyes: Conjunctivae are normal. Pupils are equal, round, and reactive to light.  Neck: Neck supple.  Cardiovascular: Normal rate, regular rhythm and normal heart sounds.   No murmur heard. Pulmonary/Chest: No respiratory distress. She has wheezes.  Mildly increased work of breathing with intermittent cough, wheezes throughout all lung fields but good air movement  Abdominal: Soft. Bowel sounds are normal. She exhibits no distension. There is no tenderness.  Musculoskeletal: She exhibits no edema.  Neurological: She is alert and oriented to person, place, and time.  Fluent speech  Skin: Skin is warm and dry.  Psychiatric: She has a normal mood and affect. Judgment normal.  Nursing note and vitals reviewed.   ED Course  Procedures (including critical care time) Labs Review Labs Reviewed - No data to display  Imaging Review Dg Chest 2 View  01/09/2016  CLINICAL DATA:  Productive cough for 2 weeks.  Congestion. EXAM: CHEST  2 VIEW COMPARISON:  01/03/2016 FINDINGS: The heart size and mediastinal contours are within normal limits. Both lungs are clear. The visualized skeletal structures are unremarkable. IMPRESSION: No active cardiopulmonary disease. Electronically Signed   By: Elige Ko   On: 01/09/2016 16:16     EKG Interpretation None     Medications  albuterol (PROVENTIL,VENTOLIN) solution continuous neb (0 mg/hr Nebulization Stopped 01/09/16 1837)  ipratropium-albuterol (DUONEB) 0.5-2.5 (3) MG/3ML nebulizer solution 3 mL (3 mLs Nebulization Given 01/09/16 1407)  predniSONE (DELTASONE) tablet 60 mg (60 mg Oral Given 01/09/16 1659)    MDM   Final diagnoses:  Asthma exacerbation  Cough    Pt with history of asthma presents with ongoing wheezing and cough in the setting of a recent upper respiratory infection. On exam, she was awake, alert, in no respiratory distress. She had a mildly  increased work of breathing with wheezes but good air movements. Gave patient DuoNeb and prednisone. Obtained a repeat chest x-ray to rule out pneumonia; chest x-ray unremarkable. On reexamination, the patient continued to complain of wheezing and had mildly increased work of breathing. Place the patient on a 1 hour albuterol treatments. On reexamination an hour after finishing the treatment, the patient was resting comfortably and stated that she felt much improved. She had improved wheezing on exam. Given her reassuring vital signs and well appearance, I feel she is safe for discharge home with outpatient treatment.   I have discussed the importance of follow-up with her pulmonologist to reevaluate her current asthma and management. Provided the patient with albuterol refill and prednisone as well as restarted her Singulair. Return precautions reviewed and patient voiced understanding. Patient discharged in satisfactory condition.   Laurence Spates, MD 01/10/16 [phone]

## 2016-01-09 NOTE — ED Notes (Signed)
C/o intermittent prod cough x 2 weeks-c/o SOB x today-NAD-talking fast paced-steady gait

## 2016-01-09 NOTE — ED Notes (Signed)
Patient transported to X-ray 

## 2016-01-09 NOTE — ED Notes (Signed)
MD at bedside. 

## 2016-01-10 MED ORDER — ALBUTEROL SULFATE HFA 108 (90 BASE) MCG/ACT IN AERS
INHALATION_SPRAY | RESPIRATORY_TRACT | Status: DC
Start: 2016-01-10 — End: 2016-01-24

## 2016-01-10 NOTE — Telephone Encounter (Signed)
Rx sent 

## 2016-01-15 ENCOUNTER — Encounter (HOSPITAL_COMMUNITY): Payer: Self-pay | Admitting: Emergency Medicine

## 2016-01-15 ENCOUNTER — Emergency Department (HOSPITAL_COMMUNITY)
Admission: EM | Admit: 2016-01-15 | Discharge: 2016-01-15 | Disposition: A | Discharge status: Home / Self Care | Payer: BLUE CROSS/BLUE SHIELD | Attending: Emergency Medicine | Admitting: Emergency Medicine

## 2016-01-15 DIAGNOSIS — F419 Anxiety disorder, unspecified: Secondary | ICD-10-CM | POA: Diagnosis not present

## 2016-01-15 DIAGNOSIS — Z87440 Personal history of urinary (tract) infections: Secondary | ICD-10-CM | POA: Diagnosis not present

## 2016-01-15 DIAGNOSIS — J45901 Unspecified asthma with (acute) exacerbation: Secondary | ICD-10-CM | POA: Insufficient documentation

## 2016-01-15 DIAGNOSIS — F329 Major depressive disorder, single episode, unspecified: Secondary | ICD-10-CM | POA: Insufficient documentation

## 2016-01-15 DIAGNOSIS — Z79899 Other long term (current) drug therapy: Secondary | ICD-10-CM | POA: Diagnosis not present

## 2016-01-15 DIAGNOSIS — R062 Wheezing: Secondary | ICD-10-CM | POA: Diagnosis present

## 2016-01-15 DIAGNOSIS — Z8742 Personal history of other diseases of the female genital tract: Secondary | ICD-10-CM | POA: Insufficient documentation

## 2016-01-15 DIAGNOSIS — I10 Essential (primary) hypertension: Secondary | ICD-10-CM | POA: Diagnosis not present

## 2016-01-15 LAB — BASIC METABOLIC PANEL
Anion gap: 11 (ref 5–15)
BUN: 16 mg/dL (ref 6–20)
CO2: 24 mmol/L (ref 22–32)
Calcium: 9.3 mg/dL (ref 8.9–10.3)
Chloride: 106 mmol/L (ref 101–111)
Creatinine, Ser: 0.77 mg/dL (ref 0.44–1.00)
GFR calc Af Amer: >60 mL/min (ref >60)
GFR calc non Af Amer: >60 mL/min (ref >60)
Glucose, Bld: 106 mg/dL — ABNORMAL HIGH (ref 65–99)
Potassium: 3.6 mmol/L (ref 3.5–5.1)
Sodium: 141 mmol/L (ref 135–145)

## 2016-01-15 LAB — CBC
HCT: 39.2 % (ref 36.0–46.0)
Hemoglobin: 12.6 g/dL (ref 12.0–15.0)
MCH: 27.5 pg (ref 26.0–34.0)
MCHC: 32.1 g/dL (ref 30.0–36.0)
MCV: 85.6 fL (ref 78.0–100.0)
Platelets: 362 10*3/uL (ref 150–400)
RBC: 4.58 MIL/uL (ref 3.87–5.11)
RDW: 13.8 % (ref 11.5–15.5)
WBC: 11.3 10*3/uL — ABNORMAL HIGH (ref 4.0–10.5)

## 2016-01-15 MED ORDER — ALBUTEROL SULFATE (2.5 MG/3ML) 0.083% IN NEBU
INHALATION_SOLUTION | RESPIRATORY_TRACT | Status: AC
  Filled 2016-01-15: qty 6

## 2016-01-15 MED ORDER — IPRATROPIUM BROMIDE 0.02 % IN SOLN
0.5000 mg | Freq: Once | RESPIRATORY_TRACT | Status: AC
Start: 2016-01-15 — End: 2016-01-15
  Administered 2016-01-15: 0.5 mg via RESPIRATORY_TRACT
  Filled 2016-01-15: qty 2.5

## 2016-01-15 MED ORDER — LORAZEPAM 1 MG PO TABS
1.0000 mg | ORAL_TABLET | Freq: Once | ORAL | Status: AC
  Administered 2016-01-15: 1 mg via ORAL
  Filled 2016-01-15: qty 1

## 2016-01-15 MED ORDER — ALBUTEROL SULFATE (2.5 MG/3ML) 0.083% IN NEBU
5.0000 mg | INHALATION_SOLUTION | Freq: Once | RESPIRATORY_TRACT | Status: AC
  Administered 2016-01-15: 5 mg via RESPIRATORY_TRACT
  Filled 2016-01-15: qty 6

## 2016-01-15 MED ORDER — ALBUTEROL SULFATE (2.5 MG/3ML) 0.083% IN NEBU
5.0000 mg | INHALATION_SOLUTION | Freq: Once | RESPIRATORY_TRACT | Status: AC
  Administered 2016-01-15: 5 mg via RESPIRATORY_TRACT

## 2016-01-15 MED ORDER — LORAZEPAM 0.5 MG PO TABS: 0.5000 mg | ORAL_TABLET | Freq: Three times a day (TID) | ORAL | Status: DC | PRN

## 2016-01-15 NOTE — ED Notes (Signed)
Pt c/o asthma exacerbation, blue nailbeds, wheezing and having a panic attack. Pt has been dealing with the flu and asthma for couple weeks, symptoms have been progressing since last night. Pt reports that she has taken lots of neb treatments and that is now causing her panic attacks.

## 2016-01-15 NOTE — Discharge Instructions (Signed)
Please read and follow all provided instructions.  Your diagnoses today include:  1. Asthma exacerbation   2. Anxiety    Tests performed today include:  Blood counts and electrolytes  Vital signs. See below for your results today.   Medications prescribed:   Ativan - anti-anxiety medication  DO NOT drive or perform any activities that require you to be awake and alert because this medicine can make you drowsy.   Take any prescribed medications only as directed.  Home care instructions:  Follow any educational materials contained in this packet.  Follow-up instructions: Please follow-up with your primary care provider in the next 2 days for further evaluation of your symptoms and management of your asthma.  Return instructions:   Please return to the Emergency Department if you experience worsening symptoms.  Please return with worsening wheezing, shortness of breath, or difficulty breathing.  Return with persistent fever above 101F.   Please return if you have any other emergent concerns.  Additional Information:  Your vital signs today were: BP 166/98 mmHg   Pulse 83   Temp(Src) 98.6 F (37 C) (Oral)   Resp 13   SpO2 99%   LMP 01/03/2016 If your blood pressure (BP) was elevated above 135/85 this visit, please have this repeated by your doctor within one month. --------------

## 2016-01-15 NOTE — ED Notes (Signed)
Pt ambulated to room. Pt ambulated to restroom.

## 2016-01-15 NOTE — ED Notes (Signed)
Pt ambulated to restroom. 

## 2016-01-15 NOTE — ED Provider Notes (Signed)
CSN: [id number]     Arrival date & time 01/15/16  0732 History   First MD Initiated Contact with Patient 01/15/16 [phone]     Chief Complaint  Patient presents with  . Asthma  . Cyanosis  . Wheezing  . Panic Attack     (Consider location/radiation/quality/duration/timing/severity/associated sxs/prior Treatment) HPI Comments: Patient with history of asthma presents with complaint of chest tightness, shortness of breath, wheezing, anxiety. Patient has had a respiratory infection for the past several weeks. She has had 2 previous ED visits which was treated with albuterol and steroids. Early this morning the patient had worsening tightness in her chest and shortness of breath which has caused her to have a panic attack. She is very anxious. She has been using her nebulizer at home without relief and feels that this is making it worse. No new fevers, runny nose, sore throat. She has occasional cough that is nonproductive. No abdominal pain, vomiting, diarrhea. Upon arrival, patient noted that her nail beds were blue. She has not been hypoxic since arrival. She denies headaches or other symptoms of carbon monoxide exposure. Family members at home do not have similar symptoms. No suspect heaters in the home. Patient denies risk factors for pulmonary embolism including: unilateral leg swelling, history of DVT/PE/other blood clots, use of exogenous hormones, recent immobilizations, recent surgery, recent travel (>4hr segment), malignancy, hemoptysis. The onset of this condition was acute. The course is constant. Aggravating factors: none. Alleviating factors: none.    The history is provided by the patient and medical records.    Past Medical History  Diagnosis Date  . Depression   . Hypertension   . Asthma   . GERD (gastroesophageal reflux disease)   . Hepatitis A   . Jaundice   . Blood transfusion   . Migraines   . UTI (urinary tract infection)   . Ovarian cyst    Past Surgical History   Procedure Laterality Date  . Appendectomy  1994  . Ovarian cyst removal    . Tonsillectomy and adenoidectomy     Family History  Problem Relation Age of Onset  . Asthma Son   . Hypertension Other   . Crohn's disease Father   . Crohn's disease Maternal Aunt   . Esophageal cancer Maternal Uncle   . Breast cancer Maternal Grandmother    Social History  Substance Use Topics  . Smoking status: Never Smoker   . Smokeless tobacco: Never Used  . Alcohol Use: No   OB History    No data available     Review of Systems  Constitutional: Negative for fever.  HENT: Negative for rhinorrhea and sore throat.   Eyes: Negative for redness.  Respiratory: Positive for cough, chest tightness, shortness of breath and wheezing.   Cardiovascular: Negative for chest pain.  Gastrointestinal: Negative for nausea, vomiting, abdominal pain and diarrhea.  Genitourinary: Negative for dysuria.  Musculoskeletal: Negative for myalgias.  Skin: Negative for rash.  Neurological: Negative for headaches.  Psychiatric/Behavioral: The patient is nervous/anxious.       Allergies  Aspirin; Codeine sulfate; and Hydrocodone-acetaminophen  Home Medications   Prior to Admission medications   Medication Sig Start Date End Date Taking? Authorizing Provider  albuterol (PROVENTIL) (2.5 MG/3ML) 0.083% nebulizer solution Take 3 mLs (2.5 mg total) by nebulization every 6 (six) hours as needed for wheezing or shortness of breath. 09/12/14   Arby Barrette, MD  albuterol (VENTOLIN HFA) 108 (90 Base) MCG/ACT inhaler INHALE 2 TO 3 PUFFS BY MOUTH EVERY  6 HOURS AS NEEDED FOR WHEEZING OR SHORTNESS OF BREATH 01/10/16   Kristian Covey, MD  amphetamine-dextroamphetamine (ADDERALL XR) 30 MG 24 hr capsule Take 1 capsule (30 mg total) by mouth every morning. May refill in two month. 12/22/15   Kristian Covey, MD  ipratropium (ATROVENT) 0.02 % nebulizer solution Take 2.5 mLs (0.5 mg total) by nebulization 4 (four) times  daily. Patient taking differently: Take 0.5 mg by nebulization every 6 (six) hours as needed for wheezing or shortness of breath.  09/12/14   Arby Barrette, MD  losartan-hydrochlorothiazide (HYZAAR) 100-12.5 MG tablet Take 1 tablet by mouth daily. 10/20/15   Kristian Covey, MD  montelukast (SINGULAIR) 10 MG tablet Take 1 tablet (10 mg total) by mouth at bedtime. 01/09/16   Laurence Spates, MD  predniSONE (DELTASONE) 50 MG tablet Take 1 tablet (50 mg total) by mouth daily. 01/09/16   Laurence Spates, MD  sertraline (ZOLOFT) 100 MG tablet Take 1 tablet (100 mg total) by mouth daily. 10/03/15   Kristian Covey, MD   BP 190/97 mmHg  Pulse 93  Temp(Src) 98.6 F (37 C) (Oral)  Resp 17  SpO2 98%  LMP 01/03/2016 Physical Exam  Constitutional: She appears well-developed and well-nourished.  HENT:  Head: Normocephalic and atraumatic.  Mouth/Throat: Oropharynx is clear and moist.  Eyes: Conjunctivae are normal. Right eye exhibits no discharge. Left eye exhibits no discharge.  Neck: Normal range of motion. Neck supple.  Cardiovascular: Normal rate, regular rhythm and normal heart sounds.   No murmur heard. Pulmonary/Chest: Effort normal. No respiratory distress. She has wheezes (Scattered end expiratory wheezing, mild). She has no rales. She exhibits no tenderness.  Abdominal: Soft. There is no tenderness.  Neurological: She is alert.  Skin: Skin is warm and dry.  Psychiatric: She has a normal mood and affect.  Nursing note and vitals reviewed.   ED Course  Procedures (including critical care time) Labs Review Labs Reviewed  BASIC METABOLIC PANEL - Abnormal; Notable for the following:    Glucose, Bld 106 (*)    All other components within normal limits  CBC - Abnormal; Notable for the following:    WBC 11.3 (*)    All other components within normal limits    Imaging Review No results found. I have personally reviewed and evaluated these images and lab results as part of my  medical decision-making.   EKG Interpretation None       8:27 AM EKG reviewed. Reviewed notes from visits the previous 2 weeks.    Vital signs reviewed and are as follows: BP 190/97 mmHg  Pulse 93  Temp(Src) 98.6 F (37 C) (Oral)  Resp 17  SpO2 98%  LMP 01/03/2016  8:53 AM Patient seen and examined. Work-up initiated. Medications ordered. Patient is very anxious. She has normal oxygen saturation. Will treat wheezing, anxiety and reassess. Reviewed past 2 CXRs. Given no fever, no concern for pneumothorax, no hypoxia -- do not feel that re-imaging warranted today unless patient needs admission.   10:04 AM Symptoms controlled. Wheezing resolved. She is much more comfortable. Family at bedside. Will discharge to home. Patient is to continue her home albuterol. Given the small amount of Ativan to use for worsening anxiety. Encouraged PCP follow-up in the next several days for reevaluation. Patient is to return to the emergency department with worsening shortness of breath, trouble breathing, increased work of breathing, fevers, or other concerns. She verbalizes understanding and agrees with plan.   MDM  Final diagnoses:  Asthma exacerbation  Anxiety   Patient with asthma exacerbated by anxiety attack. Mild wheezing on exam, now resolved. Suspect that most of her symptoms were anxiety related. No hypoxia at any point during ED stay. Pale nailbeds were likely related to panic, peripheral vasoconstriction. She is much more comfortable after anxiolytic. No concern for PE, ACS or CO exposure.     Renne CriglerJoshua Yazleemar Strassner, PA-C 01/15/16 1007  Loren Raceravid Yelverton, MD 01/24/16 1750

## 2016-01-24 ENCOUNTER — Telehealth: Payer: Self-pay | Admitting: Family Medicine

## 2016-01-24 MED ORDER — ALBUTEROL SULFATE HFA 108 (90 BASE) MCG/ACT IN AERS: INHALATION_SPRAY | RESPIRATORY_TRACT | Status: DC

## 2016-01-24 NOTE — Telephone Encounter (Signed)
RX sent in for patient. 

## 2016-01-24 NOTE — Telephone Encounter (Signed)
Pt request refill  albuterol (VENTOLIN HFA) 108 (90 Base) MCG/ACT inhaler  Pt states she has had a respiratory infection and has been using it more. [facility] told her she was only given one (1) with no refill. Pt states she needs asap.  [facility] [address]

## 2016-01-31 ENCOUNTER — Ambulatory Visit (INDEPENDENT_AMBULATORY_CARE_PROVIDER_SITE_OTHER): Payer: BLUE CROSS/BLUE SHIELD | Admitting: Family Medicine

## 2016-01-31 VITALS — BP 160/100 | HR 105 | Temp 98.2°F

## 2016-01-31 DIAGNOSIS — F411 Generalized anxiety disorder: Secondary | ICD-10-CM

## 2016-01-31 DIAGNOSIS — I10 Essential (primary) hypertension: Secondary | ICD-10-CM | POA: Diagnosis not present

## 2016-01-31 DIAGNOSIS — J4540 Moderate persistent asthma, uncomplicated: Secondary | ICD-10-CM

## 2016-01-31 MED ORDER — BECLOMETHASONE DIPROPIONATE 80 MCG/ACT IN AERS: 1.0000 | INHALATION_SPRAY | Freq: Two times a day (BID) | RESPIRATORY_TRACT | Status: DC

## 2016-01-31 NOTE — Progress Notes (Signed)
Subjective:    Patient ID: [name] BalloonChristine S Klemp, female    DOB: [birth year], 50 y.o.   MRN: [id number]  HPI Hospital Follow-up  #1 Asthma   Patient seen today to follow-up on emergency room visit on 01/15/2016.  [facility]. ED notes indicate that she had an asthma exacerbation and anxiety attack.  Per patient she was treated with nebulizer treatments and  prednisone taper then discharged.  She reports being ill with cough, wheeze and congestion for over 6 weeks.  She denies fever, chills, nausea, vomiting, or headache.  She reports since her discharge on 01/15/16, she has continued to experience wheezing and uses albuterol rescue inhaler an average of 4 times daily since discharge from ED.    #2 Hypertension  Patient reports that she hasn't been taking blood pressure medications regularly since being ill.  She is aware that her blood pressure has been greater than 150 systolic and greater than 90 diastolic during her previous ED visit.  Patient agrees that she needs to take medication and reports having sufficient amounts at home.     # 3 Anxiety  Patient reports that she stopped taking sertraline 100 mg greater than 1 month ago.  She reports that she "just stopped" medication without a specific reason.  She expresses some stressors as home which may have contributed to her anxiety.  The patient reports medication is at home and has sufficient quantity to resume.  Past Medical History  Diagnosis Date  . Depression   . Hypertension   . Asthma   . GERD (gastroesophageal reflux disease)   . Hepatitis A   . Jaundice   . Blood transfusion   . Migraines   . UTI (urinary tract infection)   . Ovarian cyst    Past Surgical History  Procedure Laterality Date  . Appendectomy  1994  . Ovarian cyst removal    . Tonsillectomy and adenoidectomy      reports that she has never smoked. She has never used smokeless tobacco. She reports that she does not drink alcohol or use  illicit drugs. family history includes Asthma in her son; Breast cancer in her maternal grandmother; Crohn's disease in her father and maternal aunt; Esophageal cancer in her maternal uncle; Hypertension in her other. Allergies  Allergen Reactions  . Aspirin Shortness Of Breath    REACTION: wheezing problems  . Codeine Sulfate Hives and Nausea And Vomiting    REACTION: rash, itiching  . Hydrocodone-Acetaminophen Itching and Rash       Review of Systems  Constitutional: Negative.   HENT: Negative.   Eyes: Negative.   Respiratory:       See HPI  Psychiatric/Behavioral: The patient is nervous/anxious.        Objective:   Physical Exam  Constitutional: She is oriented to person, place, and time. She appears well-developed and well-nourished.  HENT:  Head: Normocephalic and atraumatic.  Right Ear: External ear normal.  Left Ear: External ear normal.  Mouth/Throat: Oropharynx is clear and moist.  Eyes: Conjunctivae are normal. Pupils are equal, round, and reactive to light.  Neck: Normal range of motion. Neck supple.  Cardiovascular: Regular rhythm, normal heart sounds and intact distal pulses.   Pulmonary/Chest: Effort normal.  Expiratory fine wheeze posterior upper chest wall.  Neurological: She is alert and oriented to person, place, and time.  Skin: Skin is warm and dry.  Psychiatric:  Positive for anxiousness  Assessment & Plan:  #1 Asthma -patient has hx of frequent exacerbations and probably compounded by anxiety symptoms. Patient is currently using a short-acting inhaler only. According to asthma management guidelines, utilizing a stepped approach is the most effective way to decrease the frequency of asthma exacerbation.  Plan:  Add beclomethasone 80 mcg 1 puff, two times daily. Continue albuterol rescue inhaler as needed. Follow-up in 3 weeks to re-evaluate treatment. Pt reminded to rinse mouth after use.  #2 Hypertension-Patient presents today with  elevated blood pressure 160/100 and 160/92. Patient has a history of poor medication compliance.    Plan:  Resume Losartan-Hydrochlorothiazide 100-12.5 mg for hypertension. Follow-up in 3 weeks to re-evaluate blood pressure.  # 3 Anxiety Patient has a poor history of medication compliance and a recent episode of anxiety during an ED visit in combination with patient reported home stressors are possibly related to patient no longer taking sertraline.  Patient denies any adverse reaction to medication.  Plan:  Resume Sertraline 50 mg daily for 14 days, then increase dose back to original dose of 100 mg daily.

## 2016-01-31 NOTE — Progress Notes (Signed)
Pre visit review using our clinic review tool, if applicable. No additional management support is needed unless otherwise documented below in the visit note. 

## 2016-02-13 ENCOUNTER — Telehealth: Payer: Self-pay | Admitting: Family Medicine

## 2016-02-13 NOTE — Telephone Encounter (Signed)
Last OV 01/31/2016 Hospital F/U No Pending Appt Last refill 12/22/2015 Please advise

## 2016-02-13 NOTE — Telephone Encounter (Signed)
Pt call to ask if the following med can be release 3 days early .  amphetamine-dextroamphetamine (ADDERALL XR) 30 MG 24 hr capsule   [facility]

## 2016-02-14 ENCOUNTER — Ambulatory Visit (INDEPENDENT_AMBULATORY_CARE_PROVIDER_SITE_OTHER): Payer: BLUE CROSS/BLUE SHIELD | Admitting: Family Medicine

## 2016-02-14 ENCOUNTER — Telehealth: Payer: Self-pay | Admitting: *Deleted

## 2016-02-14 VITALS — BP 142/98 | HR 85 | Temp 98.5°F

## 2016-02-14 DIAGNOSIS — I10 Essential (primary) hypertension: Secondary | ICD-10-CM

## 2016-02-14 NOTE — Progress Notes (Signed)
Pre visit review using our clinic review tool, if applicable. No additional management support is needed unless otherwise documented below in the visit note. 

## 2016-02-14 NOTE — Patient Instructions (Addendum)
Hypertension Hypertension, commonly called high blood pressure, is when the force of blood pumping through your arteries is too strong. Your arteries are the blood vessels that carry blood from your heart throughout your body. A blood pressure reading consists of a higher number over a lower number, such as 110/72. The higher number (systolic) is the pressure inside your arteries when your heart pumps. The lower number (diastolic) is the pressure inside your arteries when your heart relaxes. Ideally you want your blood pressure below 120/80. Hypertension forces your heart to work harder to pump blood. Your arteries may become narrow or stiff. Having untreated or uncontrolled hypertension can cause heart attack, stroke, kidney disease, and other problems. RISK FACTORS Some risk factors for high blood pressure are controllable. Others are not.  Risk factors you cannot control include:   Race. You may be at higher risk if you are African American.  Age. Risk increases with age.  Gender. Men are at higher risk than women before age 45 years. After age 65, women are at higher risk than men. Risk factors you can control include:  Not getting enough exercise or physical activity.  Being overweight.  Getting too much fat, sugar, calories, or salt in your diet.  Drinking too much alcohol. SIGNS AND SYMPTOMS Hypertension does not usually cause signs or symptoms. Extremely high blood pressure (hypertensive crisis) may cause headache, anxiety, shortness of breath, and nosebleed. DIAGNOSIS To check if you have hypertension, your health care provider will measure your blood pressure while you are seated, with your arm held at the level of your heart. It should be measured at least twice using the same arm. Certain conditions can cause a difference in blood pressure between your right and left arms. A blood pressure reading that is higher than normal on one occasion does not mean that you need treatment. If  it is not clear whether you have high blood pressure, you may be asked to return on a different day to have your blood pressure checked again. Or, you may be asked to monitor your blood pressure at home for 1 or more weeks. TREATMENT Treating high blood pressure includes making lifestyle changes and possibly taking medicine. Living a healthy lifestyle can help lower high blood pressure. You may need to change some of your habits. Lifestyle changes may include:  Following the DASH diet. This diet is high in fruits, vegetables, and whole grains. It is low in salt, red meat, and added sugars.  Keep your sodium intake below 2,300 mg per day.  Getting at least 30-45 minutes of aerobic exercise at least 4 times per week.  Losing weight if necessary.  Not smoking.  Limiting alcoholic beverages.  Learning ways to reduce stress. Your health care provider may prescribe medicine if lifestyle changes are not enough to get your blood pressure under control, and if one of the following is true:  You are 18-59 years of age and your systolic blood pressure is above 140.  You are 60 years of age or older, and your systolic blood pressure is above 150.  Your diastolic blood pressure is above 90.  You have diabetes, and your systolic blood pressure is over 140 or your diastolic blood pressure is over 90.  You have kidney disease and your blood pressure is above 140/90.  You have heart disease and your blood pressure is above 140/90. Your personal target blood pressure may vary depending on your medical conditions, your age, and other factors. HOME CARE INSTRUCTIONS    Have your blood pressure rechecked as directed by your health care provider.   Take medicines only as directed by your health care provider. Follow the directions carefully. Blood pressure medicines must be taken as prescribed. The medicine does not work as well when you skip doses. Skipping doses also puts you at risk for  problems.  Do not smoke.   Monitor your blood pressure at home as directed by your health care provider. SEEK MEDICAL CARE IF:   You think you are having a reaction to medicines taken.  You have recurrent headaches or feel dizzy.  You have swelling in your ankles.  You have trouble with your vision. SEEK IMMEDIATE MEDICAL CARE IF:  You develop a severe headache or confusion.  You have unusual weakness, numbness, or feel faint.  You have severe chest or abdominal pain.  You vomit repeatedly.  You have trouble breathing. MAKE SURE YOU:   Understand these instructions.  Will watch your condition.  Will get help right away if you are not doing well or get worse.   This information is not intended to replace advice given to you by your health care provider. Make sure you discuss any questions you have with your health care provider.   Document Released: 10/21/2005 Document Revised: 03/07/2015 Document Reviewed: 08/13/2013 Elsevier Interactive Patient Education 2016 Elsevier Inc.  Monitor blood pressure and be in touch if consistently > 140/90. Get back on your Qvar twice daily.

## 2016-02-14 NOTE — Telephone Encounter (Signed)
She had horrible BP last visit and we had recommended 3 week follow up after her getting back on med.  Does she have follow up?  She really should not be on the Adderall until we can confirm BP improved

## 2016-02-14 NOTE — Telephone Encounter (Signed)
Patient was seen by Dr Caryl NeverBurchette today and stated she is going out of town and needed the Adderall Rx early and was told by the pharmacist they can release this early if approved by Dr Caryl NeverBurchette.  Per Dr Caryl NeverBurchette the pt is going out of town and can have this early.  I called [facility] at [phone] and spoke with Rob the pharmacist and informed him of this and he stated she is due on 4/15 but this can be filled early if the doctor approves this in the case of the pt leaving town or a change in therapy and they will send a message to the pt when the Rx is ready.  Dr Caryl NeverBurchette informed the pt of this.

## 2016-02-14 NOTE — Progress Notes (Signed)
   Subjective:    Patient ID: [name], female    DOB: 1966/04/15, 50 y.o.   MRN: [id number]  HPI  Follow-up hypertension  patient called for early release of Adderall as she is going out of town.  Because of her very poorly controlled blood pressure recently recommend she come back and get this reassessed. She is now taking losartan HCTZ regularly. No headaches or dizziness. Not monitoring blood pressure at home.  Has started back more consistent exercise recently.   Asthma which has been stable. Currently on Singulair but not taking her steroid inhaler regularly.  Past Medical History  Diagnosis Date  . Depression   . Hypertension   . Asthma   . GERD (gastroesophageal reflux disease)   . Hepatitis A   . Jaundice   . Blood transfusion   . Migraines   . UTI (urinary tract infection)   . Ovarian cyst    Past Surgical History  Procedure Laterality Date  . Appendectomy  1994  . Ovarian cyst removal    . Tonsillectomy and adenoidectomy      reports that she has never smoked. She has never used smokeless tobacco. She reports that she does not drink alcohol or use illicit drugs. family history includes Asthma in her son; Breast cancer in her maternal grandmother; Crohn's disease in her father and maternal aunt; Esophageal cancer in her maternal uncle; Hypertension in her other. Allergies  Allergen Reactions  . Aspirin Shortness Of Breath    REACTION: wheezing problems  . Codeine Sulfate Hives and Nausea And Vomiting    REACTION: rash, itiching  . Hydrocodone-Acetaminophen Itching and Rash      Review of Systems  Constitutional: Negative for fatigue.  Eyes: Negative for visual disturbance.  Respiratory: Negative for cough, chest tightness, shortness of breath and wheezing.   Cardiovascular: Negative for chest pain, palpitations and leg swelling.  Neurological: Negative for dizziness, seizures, syncope, weakness, light-headedness and headaches.       Objective:     Physical Exam  Constitutional: She appears well-developed and well-nourished.  Eyes: Pupils are equal, round, and reactive to light.  Neck: Neck supple. No JVD present. No thyromegaly present.  Cardiovascular: Normal rate and regular rhythm.  Exam reveals no gallop.   Pulmonary/Chest: Effort normal and breath sounds normal. No respiratory distress. She has no wheezes. She has no rales.  Musculoskeletal: She exhibits no edema.  Neurological: She is alert. No cranial nerve deficit.          Assessment & Plan:   Hypertension. Improved. Still not to goal. We recommended exercise and weight loss and reassess in 3 months. If not < 140/90 at that time will add additional medication.

## 2016-02-14 NOTE — Telephone Encounter (Signed)
No refill. Pt needs a follow up scheduled for next week to "follow up adhd and BP". Thanks.

## 2016-02-14 NOTE — Telephone Encounter (Signed)
Pt scheduled  

## 2016-02-15 ENCOUNTER — Ambulatory Visit: Payer: Self-pay | Admitting: Family Medicine

## 2016-03-13 ENCOUNTER — Telehealth: Payer: Self-pay | Admitting: Family Medicine

## 2016-03-13 NOTE — Telephone Encounter (Signed)
Husband called stating that pts son [name] RuizJohn ran away. Last refill of Lorazepam was 01/15/2016 #8. Please advise.

## 2016-03-13 NOTE — Telephone Encounter (Addendum)
Pt needs a refill lorazepam .  Pt son ran away [facility] [place]

## 2016-03-14 ENCOUNTER — Telehealth: Payer: Self-pay | Admitting: Family Medicine

## 2016-03-14 MED ORDER — LORAZEPAM 0.5 MG PO TABS: 0.5000 mg | ORAL_TABLET | Freq: Three times a day (TID) | ORAL | Status: DC | PRN

## 2016-03-14 NOTE — Telephone Encounter (Signed)
Pt need new Rx for Adderall °

## 2016-03-14 NOTE — Telephone Encounter (Signed)
Medication has been verbally called in for patient.

## 2016-03-14 NOTE — Telephone Encounter (Signed)
May refill once (#8).  We do not recommend regular use- only for severe anxiety symptoms.

## 2016-03-15 ENCOUNTER — Other Ambulatory Visit: Payer: Self-pay | Admitting: *Deleted

## 2016-03-15 MED ORDER — ALBUTEROL SULFATE HFA 108 (90 BASE) MCG/ACT IN AERS: INHALATION_SPRAY | RESPIRATORY_TRACT | Status: DC

## 2016-03-15 MED ORDER — AMPHETAMINE-DEXTROAMPHET ER 30 MG PO CP24: ORAL_CAPSULE | ORAL | Status: DC

## 2016-03-15 MED ORDER — MONTELUKAST SODIUM 10 MG PO TABS: 10.0000 mg | ORAL_TABLET | Freq: Every day | ORAL | Status: DC

## 2016-03-15 NOTE — Telephone Encounter (Signed)
Printed for signature

## 2016-03-15 NOTE — Telephone Encounter (Signed)
Refill OK

## 2016-03-15 NOTE — Telephone Encounter (Signed)
Pt aware that RX is up front for pick up.

## 2016-03-15 NOTE — Telephone Encounter (Signed)
Last refill was 12/22/2015 #30 Please advise

## 2016-03-26 ENCOUNTER — Telehealth: Payer: Self-pay | Admitting: Family Medicine

## 2016-03-26 NOTE — Telephone Encounter (Signed)
Looks like Lorazepam was filled #8 on 03/14/16.

## 2016-03-26 NOTE — Telephone Encounter (Signed)
i do not recommend regular use of benzos.  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  I highly recommend counseling.  Also, make sure she is taking her Sertraline .  If she is taking the Sertraline 100 mg once daily may increase to 150 mg once daily (one and one half tablets)

## 2016-03-26 NOTE — Telephone Encounter (Signed)
Pt states she is going through a lot of issues and would like to know if you will refill her  ALPRAZolam Prudy Feeler(XANAX) 0.25 MG tablet [[id number]][[id number]]  [facility] [address]  Pt may need referral to see a counselor. Pt made appointment for 5/31, but hopes you will refill xanax today.

## 2016-03-27 ENCOUNTER — Other Ambulatory Visit: Payer: Self-pay | Admitting: Family Medicine

## 2016-03-27 MED ORDER — SERTRALINE HCL 100 MG PO TABS: ORAL_TABLET | ORAL | Status: DC

## 2016-04-03 ENCOUNTER — Ambulatory Visit: Payer: BLUE CROSS/BLUE SHIELD | Admitting: Family Medicine

## 2016-04-03 DIAGNOSIS — Z0289 Encounter for other administrative examinations: Secondary | ICD-10-CM

## 2016-04-17 ENCOUNTER — Ambulatory Visit (INDEPENDENT_AMBULATORY_CARE_PROVIDER_SITE_OTHER): Payer: BLUE CROSS/BLUE SHIELD | Admitting: Family Medicine

## 2016-04-17 ENCOUNTER — Telehealth: Payer: Self-pay | Admitting: Family Medicine

## 2016-04-17 VITALS — BP 122/88 | HR 90 | Temp 98.4°F

## 2016-04-17 DIAGNOSIS — F4323 Adjustment disorder with mixed anxiety and depressed mood: Secondary | ICD-10-CM | POA: Diagnosis not present

## 2016-04-17 DIAGNOSIS — I10 Essential (primary) hypertension: Secondary | ICD-10-CM

## 2016-04-17 DIAGNOSIS — F909 Attention-deficit hyperactivity disorder, unspecified type: Secondary | ICD-10-CM

## 2016-04-17 DIAGNOSIS — F988 Other specified behavioral and emotional disorders with onset usually occurring in childhood and adolescence: Secondary | ICD-10-CM

## 2016-04-17 MED ORDER — AMPHETAMINE-DEXTROAMPHETAMINE 20 MG PO TABS: ORAL_TABLET | ORAL | Status: DC

## 2016-04-17 MED ORDER — AMPHETAMINE-DEXTROAMPHETAMINE 20 MG PO TABS: 20.0000 mg | ORAL_TABLET | Freq: Two times a day (BID) | ORAL | Status: DC

## 2016-04-17 NOTE — Progress Notes (Signed)
   Subjective:    Patient ID: [name], female    DOB: October 27, 1966, 50 y.o.   MRN: [id number]  HPI Patient seen for medical follow-up regarding several items  Hypertension. Treated with losartan HCTZ. Feels much better since starting treatment. Blood pressure been stable. No headaches. No dizziness. Denies any medication side effects. She is compliant with therapy  Attention deficit disorder. We withheld treatment recently because of poorly controlled hypertension. She is requesting going back on Adderall 20 mg twice daily. She's had tremendous difficulties with focusing at work  History of chronic anxiety and depression. Improved on sertraline. Currently on 150 mg once daily. She's had tremendous stress issues with her son who has been abusing marijuana. Patient has had extensive counseling in the past.  Past Medical History  Diagnosis Date  . Depression   . Hypertension   . Asthma   . GERD (gastroesophageal reflux disease)   . Hepatitis A   . Jaundice   . Blood transfusion   . Migraines   . UTI (urinary tract infection)   . Ovarian cyst    Past Surgical History  Procedure Laterality Date  . Appendectomy  1994  . Ovarian cyst removal    . Tonsillectomy and adenoidectomy      reports that she has never smoked. She has never used smokeless tobacco. She reports that she does not drink alcohol or use illicit drugs. family history includes Asthma in her son; Breast cancer in her maternal grandmother; Crohn's disease in her father and maternal aunt; Esophageal cancer in her maternal uncle; Hypertension in her other. Allergies  Allergen Reactions  . Aspirin Shortness Of Breath    REACTION: wheezing problems  . Codeine Sulfate Hives and Nausea And Vomiting    REACTION: rash, itiching  . Hydrocodone-Acetaminophen Itching and Rash      Review of Systems  Constitutional: Negative for fatigue and unexpected weight change.  Eyes: Negative for visual disturbance.    Respiratory: Negative for cough, chest tightness, shortness of breath and wheezing.   Cardiovascular: Negative for chest pain, palpitations and leg swelling.  Neurological: Negative for dizziness, seizures, syncope, weakness, light-headedness and headaches.  Psychiatric/Behavioral: Negative for dysphoric mood.       Objective:   Physical Exam  Constitutional: She appears well-developed and well-nourished.  HENT:  Mouth/Throat: Oropharynx is clear and moist.  Neck: Neck supple. No thyromegaly present.  Cardiovascular: Normal rate and regular rhythm.   Pulmonary/Chest: Effort normal and breath sounds normal. No respiratory distress. She has no wheezes. She has no rales.  Musculoskeletal: She exhibits no edema.  Lymphadenopathy:    She has no cervical adenopathy.          Assessment & Plan:  #1 hypertension. Greatly improved. Continue Hyzaar. Establish more consistent aerobic exercise. Reassess 6 months  #2 attention deficit disorder. We discussed getting her back on Adderall 20 mg twice a day. She felt like extended release was not lasting adequate duration. We feel comfortable getting her back on this as her blood pressure is controlled  #3 history of depression and anxiety. Stable. Continue sertraline. We recommended against regular use of benzodiazepines  Kristian CoveyBruce W Sheilla Maris MD [facility]

## 2016-04-17 NOTE — Telephone Encounter (Signed)
Pt call to say she has been taking XR  amphetamine-dextroamphetamine (ADDERALL) 20 MG tablet twice a day. She said please verify this with Dr Caryl NeverBurchette cause he will verify that she takes XR.

## 2016-04-17 NOTE — Telephone Encounter (Signed)
Looks like in the past she was taking XR 20mg  bid. Okay to switch medication?

## 2016-04-17 NOTE — Progress Notes (Signed)
Pre visit review using our clinic review tool, if applicable. No additional management support is needed unless otherwise documented below in the visit note. 

## 2016-04-18 NOTE — Telephone Encounter (Signed)
Given her hypertension hx, I do not feel comfortable going back to BID dosing of Adderall XR.  If she feels that the short acting 20 mg is insufficient, our options are: -increasing short acting Adderall to 30 mg bid or -Increasing Adderall XR to 30 mg ONCE daily. - a third option would be to have her on Adderall XR once daily and adding a low dose short acting Adderall (eg 10 mg) late afternoon.

## 2016-04-19 NOTE — Telephone Encounter (Signed)
Yes.  She would need to bring back in her Rx written 2 days ago for Adderall 20 mg bid.  We change start:  Adderall XR 20 mg po qd #30 and  Adderall 10 mg (not XR) one po qd #30

## 2016-04-19 NOTE — Telephone Encounter (Signed)
Advised pt of options.   Pt states a 20mg  XR Adderall for am and 10mg  Adderall (short acting) for pm would work well for her if you think that would be appropriate.

## 2016-04-19 NOTE — Telephone Encounter (Signed)
LVM to return our call.

## 2016-04-23 MED ORDER — AMPHETAMINE-DEXTROAMPHETAMINE 10 MG PO TABS: 10.0000 mg | ORAL_TABLET | Freq: Every day | ORAL | Status: DC

## 2016-04-23 MED ORDER — AMPHETAMINE-DEXTROAMPHET ER 20 MG PO CP24: 20.0000 mg | ORAL_CAPSULE | Freq: Every day | ORAL | Status: DC

## 2016-04-23 NOTE — Telephone Encounter (Signed)
Printed for signature

## 2016-04-23 NOTE — Telephone Encounter (Signed)
Pt is aware that scripts are up front for pick up. 

## 2016-04-23 NOTE — Telephone Encounter (Signed)
Left detailed message for pt to call back and discuss medications.

## 2016-04-23 NOTE — Telephone Encounter (Signed)
Pt will bring rx back

## 2016-05-10 ENCOUNTER — Other Ambulatory Visit: Payer: Self-pay | Admitting: *Deleted

## 2016-05-10 MED ORDER — MONTELUKAST SODIUM 10 MG PO TABS: 10.0000 mg | ORAL_TABLET | Freq: Every day | ORAL | Status: DC

## 2016-05-20 ENCOUNTER — Telehealth: Payer: Self-pay | Admitting: Family Medicine

## 2016-05-20 MED ORDER — AMPHETAMINE-DEXTROAMPHET ER 20 MG PO CP24: 20.0000 mg | ORAL_CAPSULE | Freq: Every day | ORAL | Status: DC

## 2016-05-20 NOTE — Telephone Encounter (Signed)
OK 

## 2016-05-20 NOTE — Telephone Encounter (Signed)
Given to [place]ana up front.

## 2016-05-20 NOTE — Telephone Encounter (Signed)
Husband called back and aware rx approved for early refill.  Pt will be here 4 pm to pick up.

## 2016-05-20 NOTE — Telephone Encounter (Signed)
Pt needs new generic adderall xr 20 mg. Pt is going out of town tomorrow and would like to pick up rx today

## 2016-05-20 NOTE — Telephone Encounter (Signed)
Pt is not due for a refill until 7/20. Please advise if okay to refill early?

## 2016-05-20 NOTE — Telephone Encounter (Signed)
Printed for signature

## 2016-05-28 ENCOUNTER — Other Ambulatory Visit: Payer: Self-pay | Admitting: Emergency Medicine

## 2016-05-28 MED ORDER — ALBUTEROL SULFATE HFA 108 (90 BASE) MCG/ACT IN AERS: INHALATION_SPRAY | RESPIRATORY_TRACT | 2 refills | Status: DC

## 2016-06-12 ENCOUNTER — Telehealth: Payer: Self-pay | Admitting: Family Medicine

## 2016-06-12 MED ORDER — LORAZEPAM 0.5 MG PO TABS
0.5000 mg | ORAL_TABLET | Freq: Three times a day (TID) | ORAL | 0 refills | Status: DC | PRN
Start: 2016-06-12 — End: 2016-06-21

## 2016-06-12 NOTE — Telephone Encounter (Signed)
Medication called in for patient.  

## 2016-06-12 NOTE — Telephone Encounter (Signed)
May refill #10 tablets only of Lorazepam 0.5 mg one every 6 hours prn severe anxiety.  Do not recommend regular use.

## 2016-06-12 NOTE — Telephone Encounter (Signed)
Pt son ran away last night . Pt son is in rehab in [place]. Pt needs something to calm her down.  [facility] [address]

## 2016-06-14 ENCOUNTER — Telehealth: Payer: Self-pay | Admitting: Family Medicine

## 2016-06-14 ENCOUNTER — Other Ambulatory Visit: Payer: Self-pay

## 2016-06-14 MED ORDER — AMPHETAMINE-DEXTROAMPHET ER 20 MG PO CP24: 20.0000 mg | ORAL_CAPSULE | Freq: Every day | ORAL | 0 refills | Status: DC

## 2016-06-14 MED ORDER — ALBUTEROL SULFATE (2.5 MG/3ML) 0.083% IN NEBU: 2.5000 mg | INHALATION_SOLUTION | Freq: Four times a day (QID) | RESPIRATORY_TRACT | 12 refills | Status: DC | PRN

## 2016-06-14 MED ORDER — ALBUTEROL SULFATE HFA 108 (90 BASE) MCG/ACT IN AERS: INHALATION_SPRAY | RESPIRATORY_TRACT | 2 refills | Status: DC

## 2016-06-14 MED ORDER — AMPHETAMINE-DEXTROAMPHETAMINE 10 MG PO TABS: 10.0000 mg | ORAL_TABLET | Freq: Every day | ORAL | 0 refills | Status: DC

## 2016-06-14 NOTE — Telephone Encounter (Signed)
Last refill on Adderall XR #30 on 05/20/16 Last refill on Adderall plain #330, 1 rf Please advise if okay to fill

## 2016-06-14 NOTE — Telephone Encounter (Signed)
Scripts have been printed and given to pt in office. Inhaler was sent to pharmacy.

## 2016-06-14 NOTE — Telephone Encounter (Signed)
Pt need new Rx for Adderall on both, albuterol (VENTOLIN).  Pt is going to [place] to get son out of jail and take him back to the rehab center.  Pt will be out of work until the end of the month.  Pt have records of everything and can bring them if needed.  Pt is trying to leave on Saturday to get son.

## 2016-06-17 ENCOUNTER — Telehealth: Payer: Self-pay | Admitting: Family Medicine

## 2016-06-17 NOTE — Telephone Encounter (Signed)
Can we authorize them to fill this before 17th?

## 2016-06-17 NOTE — Telephone Encounter (Signed)
° °  Pt said she need you to call [facility] and let them know that they can refill her med early    [address]

## 2016-06-17 NOTE — Telephone Encounter (Signed)
Since she is leaving state, may refill early this one occasion.

## 2016-06-18 NOTE — Telephone Encounter (Signed)
Pharmacist is aware to refill medication early. He did let me know that he told Ms. [name] she could not get it filled until 8/17 and she said that she would wait until then. But I though she was going out of town. Just an FYI.

## 2016-06-19 ENCOUNTER — Other Ambulatory Visit: Payer: Self-pay | Admitting: Family Medicine

## 2016-06-21 ENCOUNTER — Other Ambulatory Visit: Payer: Self-pay | Admitting: Family Medicine

## 2016-06-21 ENCOUNTER — Telehealth: Payer: Self-pay | Admitting: Family Medicine

## 2016-06-21 NOTE — Telephone Encounter (Signed)
Pt is aware of previous message 

## 2016-06-21 NOTE — Telephone Encounter (Signed)
Pt request refill  LORazepam (ATIVAN) 0.5 MG tablet  Pt states this is urgent to get this filled.   She is leaving to go back to [place] at 3:30 today for her son.

## 2016-06-21 NOTE — Telephone Encounter (Signed)
Rx done and I called the pt and informed her of this.  I also advised her per Dr Caryl NeverBurchette he does not  recommend regular use of this and to take this only as needed and she agreed.

## 2016-06-21 NOTE — Telephone Encounter (Signed)
Pt needs refill  lor[facility]epam [facility] [place]

## 2016-06-21 NOTE — Telephone Encounter (Signed)
Error/njr °

## 2016-06-21 NOTE — Telephone Encounter (Signed)
According to chart-#10 called in on 06/12/2016

## 2016-06-21 NOTE — Telephone Encounter (Signed)
May refill once but I would not recommend regular use.

## 2016-07-11 ENCOUNTER — Telehealth: Payer: Self-pay | Admitting: Family Medicine

## 2016-07-11 NOTE — Telephone Encounter (Signed)
Can refill on 07/12/2016--will print then.

## 2016-07-11 NOTE — Telephone Encounter (Signed)
Refill OK

## 2016-07-11 NOTE — Telephone Encounter (Signed)
Pt nneds new rxs generic adderall xr 20 AND 10 mg

## 2016-07-11 NOTE — Telephone Encounter (Signed)
Last OV was 04-17-2016 Last refill on Adderall XR and instant release was 06-14-2016 #30 of each Please advise

## 2016-07-12 MED ORDER — AMPHETAMINE-DEXTROAMPHET ER 20 MG PO CP24: 20.0000 mg | ORAL_CAPSULE | Freq: Every day | ORAL | 0 refills | Status: DC

## 2016-07-12 MED ORDER — AMPHETAMINE-DEXTROAMPHETAMINE 10 MG PO TABS: 10.0000 mg | ORAL_TABLET | Freq: Every day | ORAL | 0 refills | Status: DC

## 2016-07-12 NOTE — Telephone Encounter (Signed)
Due On Monday and will print then.

## 2016-07-12 NOTE — Telephone Encounter (Signed)
Printed for signature

## 2016-07-12 NOTE — Addendum Note (Signed)
Addended by: Tempie HoistMCNEIL, Dusty Wagoner M on: 07/12/2016 10:52 AM   Modules accepted: Orders

## 2016-07-15 MED ORDER — AMPHETAMINE-DEXTROAMPHET ER 20 MG PO CP24: 20.0000 mg | ORAL_CAPSULE | Freq: Every day | ORAL | 0 refills | Status: DC

## 2016-07-15 MED ORDER — AMPHETAMINE-DEXTROAMPHETAMINE 10 MG PO TABS: 10.0000 mg | ORAL_TABLET | Freq: Every day | ORAL | 0 refills | Status: DC

## 2016-07-15 NOTE — Addendum Note (Signed)
Addended by: Tempie HoistMCNEIL, Tanina Barb M on: 07/15/2016 09:22 AM   Modules accepted: Orders

## 2016-07-15 NOTE — Telephone Encounter (Signed)
Pt is aware via voicemail that script is up front for pick up. 

## 2016-07-15 NOTE — Telephone Encounter (Signed)
Printed for signature

## 2016-08-08 ENCOUNTER — Emergency Department (HOSPITAL_BASED_OUTPATIENT_CLINIC_OR_DEPARTMENT_OTHER): Payer: BLUE CROSS/BLUE SHIELD

## 2016-08-08 ENCOUNTER — Emergency Department (HOSPITAL_BASED_OUTPATIENT_CLINIC_OR_DEPARTMENT_OTHER)
Admission: EM | Admit: 2016-08-08 | Discharge: 2016-08-08 | Disposition: A | Discharge status: Home / Self Care | Payer: BLUE CROSS/BLUE SHIELD | Attending: Emergency Medicine | Admitting: Emergency Medicine

## 2016-08-08 ENCOUNTER — Encounter (HOSPITAL_BASED_OUTPATIENT_CLINIC_OR_DEPARTMENT_OTHER): Payer: Self-pay | Admitting: *Deleted

## 2016-08-08 DIAGNOSIS — S6991XA Unspecified injury of right wrist, hand and finger(s), initial encounter: Secondary | ICD-10-CM | POA: Diagnosis present

## 2016-08-08 DIAGNOSIS — Z7951 Long term (current) use of inhaled steroids: Secondary | ICD-10-CM | POA: Insufficient documentation

## 2016-08-08 DIAGNOSIS — I10 Essential (primary) hypertension: Secondary | ICD-10-CM | POA: Diagnosis not present

## 2016-08-08 DIAGNOSIS — S62624A Displaced fracture of medial phalanx of right ring finger, initial encounter for closed fracture: Secondary | ICD-10-CM

## 2016-08-08 DIAGNOSIS — X58XXXA Exposure to other specified factors, initial encounter: Secondary | ICD-10-CM | POA: Insufficient documentation

## 2016-08-08 DIAGNOSIS — J45909 Unspecified asthma, uncomplicated: Secondary | ICD-10-CM | POA: Insufficient documentation

## 2016-08-08 DIAGNOSIS — Y939 Activity, unspecified: Secondary | ICD-10-CM | POA: Diagnosis not present

## 2016-08-08 DIAGNOSIS — Y929 Unspecified place or not applicable: Secondary | ICD-10-CM | POA: Insufficient documentation

## 2016-08-08 DIAGNOSIS — Y999 Unspecified external cause status: Secondary | ICD-10-CM | POA: Diagnosis not present

## 2016-08-08 DIAGNOSIS — Z79899 Other long term (current) drug therapy: Secondary | ICD-10-CM | POA: Diagnosis not present

## 2016-08-08 MED ORDER — OXYCODONE-ACETAMINOPHEN 5-325 MG PO TABS: 1.0000 | ORAL_TABLET | Freq: Four times a day (QID) | ORAL | 0 refills | Status: DC | PRN

## 2016-08-08 NOTE — ED Provider Notes (Signed)
[facility]   CSN: [id number] Arrival date & time: 08/08/16  4098     History   Chief Complaint Chief Complaint  Patient presents with  . Finger Injury    HPI [name] is a 50 y.o. female.  Occurred yesterday evening, hand got stuck in weed-eater handle and pulled away from her, immediate pain, swelling, took aleve with some relief but continues to have pain, finger looks out of position.   The history is provided by the patient.  Hand Pain  This is a new problem. The current episode started yesterday. The problem occurs constantly. The problem has not changed since onset.Exacerbated by: movement. The symptoms are relieved by ice and NSAIDs. Treatments tried: Aleve. The treatment provided moderate relief.    Past Medical History:  Diagnosis Date  . Asthma   . Blood transfusion   . Depression   . GERD (gastroesophageal reflux disease)   . Hepatitis A   . Hypertension   . Jaundice   . Migraines   . Ovarian cyst   . UTI (urinary tract infection)     Patient Active Problem List   Diagnosis Date Noted  . ALLERGIC RHINITIS 01/29/2010  . Attention deficit disorder 12/14/2009  . Anxiety state 10/23/2009  . Adjustment disorder with mixed anxiety and depressed mood 05/25/2009  . Essential hypertension 05/25/2009  . Asthma 05/25/2009  . GERD 05/25/2009    Past Surgical History:  Procedure Laterality Date  . APPENDECTOMY  1994  . OVARIAN CYST REMOVAL    . TONSILLECTOMY AND ADENOIDECTOMY      OB History    No data available       Home Medications    Prior to Admission medications   Medication Sig Start Date End Date Taking? Authorizing Provider  albuterol (PROVENTIL) (2.5 MG/3ML) 0.083% nebulizer solution Take 3 mLs (2.5 mg total) by nebulization every 6 (six) hours as needed for wheezing or shortness of breath. 06/14/16  Yes Kristian Covey, MD  albuterol (VENTOLIN HFA) 108 (90 Base) MCG/ACT inhaler INHALE 2 TO 3 PUFFS BY  MOUTH EVERY 6 HOURS AS NEEDED FOR WHEEZING OR SHORTNESS OF BREATH 06/14/16  Yes Kristian Covey, MD  amphetamine-dextroamphetamine (ADDERALL XR) 20 MG 24 hr capsule Take 1 capsule (20 mg total) by mouth daily. May refill on or after 06/20/2016. 07/15/16  Yes Kristian Covey, MD  losartan-hydrochlorothiazide (HYZAAR) 100-12.5 MG tablet Take 1 tablet by mouth daily. 10/20/15  Yes Kristian Covey, MD  montelukast (SINGULAIR) 10 MG tablet Take 1 tablet (10 mg total) by mouth at bedtime. 05/10/16  Yes Kristian Covey, MD  sertraline (ZOLOFT) 100 MG tablet Take one and a half tablets daily. 03/27/16  Yes Kristian Covey, MD  amphetamine-dextroamphetamine (ADDERALL) 10 MG tablet Take 1 tablet (10 mg total) by mouth daily after lunch. 07/15/16   Kristian Covey, MD  beclomethasone (QVAR) 80 MCG/ACT inhaler Inhale 1 puff into the lungs 2 (two) times daily. 01/31/16   Kristian Covey, MD  LORazepam (ATIVAN) 0.5 MG tablet take 1 tablet by mouth every 8 hours if needed for anxiety or sleep 06/21/16   Kristian Covey, MD    Family History Family History  Problem Relation Age of Onset  . Asthma Son   . Hypertension Other   . Crohn's disease Father   . Crohn's disease Maternal Aunt   . Esophageal cancer Maternal Uncle   . Breast cancer Maternal Grandmother     Social History  Social History  Substance Use Topics  . Smoking status: Never Smoker  . Smokeless tobacco: Never Used  . Alcohol use Yes     Comment: several times monthly     Allergies   Aspirin; Codeine sulfate; and Hydrocodone-acetaminophen   Review of Systems Review of Systems  All other systems reviewed and are negative.    Physical Exam Updated Vital Signs BP (!) 176/104 (BP Location: Left Arm) Comment: pt reports not taking BP med today or yesterday; states she intends to have it refilled today  Pulse 76   Temp 97.8 F (36.6 C) (Oral)   Resp 20   Ht 5\' 6"  (1.676 m)   Wt 148 lb (67.1 kg)   LMP 07/27/2016 (Exact  Date)   SpO2 98%   BMI 23.89 kg/m   Physical Exam  Constitutional: She is oriented to person, place, and time. She appears well-developed and well-nourished. No distress.  HENT:  Head: Normocephalic.  Nose: Nose normal.  Eyes: Conjunctivae are normal.  Neck: Neck supple. No tracheal deviation present.  Cardiovascular: Normal rate and regular rhythm.   Pulmonary/Chest: Effort normal. No respiratory distress.  Abdominal: Soft. She exhibits no distension.  Musculoskeletal:       Right hand: She exhibits decreased range of motion (2/2 pain), tenderness (middle phalanx ring finger), deformity (mild radial angulatin of middle phalanx of ring finger) and swelling (with ecchymosis spreading over flexor portion of ring finger). She exhibits normal capillary refill. Normal sensation noted.  Neurological: She is alert and oriented to person, place, and time.  Skin: Skin is warm and dry.  Psychiatric: She has a normal mood and affect.     ED Treatments / Results  Labs (all labs ordered are listed, but only abnormal results are displayed) Labs Reviewed - No data to display  EKG  EKG Interpretation None       Radiology Dg Finger Ring Right  Result Date: 08/08/2016 CLINICAL DATA:  50 year old female pain in the right ring finger with swelling since yesterday after injuring finger. EXAM: RIGHT RING FINGER 2+V COMPARISON:  None. FINDINGS: There is an oblique fracture of the distal aspect of the right fourth middle phalanx, with approximately 2 mm of volar displacement of the distal fracture fragment. The fracture line appears to extend to the ulnar aspect of the articular surface at the DIP joint. Overlying soft tissues are swollen. IMPRESSION: 1. Oblique minimally displaced intra-articular fracture of the distal aspect of the right fourth middle phalanx, as above. Electronically Signed   By: Trudie Reed M.D.   On: 08/08/2016 09:38    Procedures Procedures (including critical care  time)  SPLINT APPLICATION Date/Time: 9:53 AM Authorized by: Lyndal Pulley Consent: Verbal consent obtained. Risks and benefits: risks, benefits and alternatives were discussed Consent given by: patient Splint applied by: orthopedic technician Location details: right ring finger Splint type: static finger Supplies used: aluminum foam Post-procedure: The splinted body part was neurovascularly unchanged following the procedure. Patient tolerance: Patient tolerated the procedure well with no immediate complications.    Medications Ordered in ED Medications - No data to display   Initial Impression / Assessment and Plan / ED Course  I have reviewed the triage vital signs and the nursing notes.  Pertinent labs & imaging results that were available during my care of the patient were reviewed by me and considered in my medical decision making (see chart for details).  Clinical Course    50 year old female presents with right ring finger injury with notable  bruising and swelling with slight angulation of the middle phalanx consistent with distal fracture into the joint space.injury is acceptably approximated. Splinted, pain control and follow up with hand surgery.  Final Clinical Impressions(s) / ED Diagnoses   Final diagnoses:  Displaced fracture of middle phalanx of right ring finger, initial encounter for closed fracture    New Prescriptions New Prescriptions   OXYCODONE-ACETAMINOPHEN (PERCOCET/ROXICET) 5-325 MG TABLET    Take 1 tablet by mouth every 6 (six) hours as needed for severe pain.     Lyndal Pulleyaniel Leonilda Cozby, MD 08/08/16 [phone]

## 2016-08-08 NOTE — ED Triage Notes (Signed)
Pt reports yesterday evening she was using a weedeater and the handle spun around and injured her R ring finger. Pt denies numbness/tingling.

## 2016-08-08 NOTE — ED Triage Notes (Signed)
Pt reports taking 3 tabs of Aleve around 0400 today.

## 2016-08-09 ENCOUNTER — Other Ambulatory Visit: Payer: Self-pay | Admitting: Family Medicine

## 2016-08-09 NOTE — Telephone Encounter (Signed)
Pt needs new rx generic adderall xr 20 °

## 2016-08-12 ENCOUNTER — Other Ambulatory Visit: Payer: Self-pay | Admitting: Orthopedic Surgery

## 2016-08-12 MED ORDER — AMPHETAMINE-DEXTROAMPHET ER 20 MG PO CP24: 20.0000 mg | ORAL_CAPSULE | Freq: Every day | ORAL | 0 refills | Status: DC

## 2016-08-12 NOTE — Telephone Encounter (Signed)
Left message on personal voicemail Rx's ready for pickup will be at the front desk. Rx's printed and signed by Dr. Caryl NeverBurchette.

## 2016-08-13 ENCOUNTER — Encounter (HOSPITAL_BASED_OUTPATIENT_CLINIC_OR_DEPARTMENT_OTHER): Payer: Self-pay | Admitting: *Deleted

## 2016-08-15 ENCOUNTER — Encounter (HOSPITAL_BASED_OUTPATIENT_CLINIC_OR_DEPARTMENT_OTHER)
Admission: RE | Disposition: A | Discharge status: Home / Self Care | Payer: Self-pay | Source: Ambulatory Visit | Attending: Orthopedic Surgery

## 2016-08-15 ENCOUNTER — Ambulatory Visit (HOSPITAL_BASED_OUTPATIENT_CLINIC_OR_DEPARTMENT_OTHER): Payer: BLUE CROSS/BLUE SHIELD | Admitting: Anesthesiology

## 2016-08-15 ENCOUNTER — Encounter (HOSPITAL_BASED_OUTPATIENT_CLINIC_OR_DEPARTMENT_OTHER): Payer: Self-pay | Admitting: Certified Registered"

## 2016-08-15 ENCOUNTER — Ambulatory Visit (HOSPITAL_BASED_OUTPATIENT_CLINIC_OR_DEPARTMENT_OTHER)
Admission: RE | Admit: 2016-08-15 | Discharge: 2016-08-15 | Disposition: A | Discharge status: Home / Self Care | Payer: BLUE CROSS/BLUE SHIELD | Source: Ambulatory Visit | Attending: Orthopedic Surgery | Admitting: Orthopedic Surgery

## 2016-08-15 DIAGNOSIS — I10 Essential (primary) hypertension: Secondary | ICD-10-CM | POA: Insufficient documentation

## 2016-08-15 DIAGNOSIS — K219 Gastro-esophageal reflux disease without esophagitis: Secondary | ICD-10-CM | POA: Diagnosis not present

## 2016-08-15 DIAGNOSIS — X501XXA Overexertion from prolonged static or awkward postures, initial encounter: Secondary | ICD-10-CM | POA: Diagnosis not present

## 2016-08-15 DIAGNOSIS — S62622A Displaced fracture of medial phalanx of right middle finger, initial encounter for closed fracture: Secondary | ICD-10-CM | POA: Insufficient documentation

## 2016-08-15 DIAGNOSIS — S62624A Displaced fracture of medial phalanx of right ring finger, initial encounter for closed fracture: Secondary | ICD-10-CM | POA: Diagnosis present

## 2016-08-15 HISTORY — PX: OPEN REDUCTION INTERNAL FIXATION (ORIF) METACARPAL: SHX6234

## 2016-08-15 SURGERY — OPEN REDUCTION INTERNAL FIXATION (ORIF) METACARPAL
Anesthesia: General | Site: Finger | Laterality: Right

## 2016-08-15 MED ORDER — FENTANYL CITRATE (PF) 100 MCG/2ML IJ SOLN
INTRAMUSCULAR | Status: AC
  Filled 2016-08-15: qty 2

## 2016-08-15 MED ORDER — HYDROCODONE-ACETAMINOPHEN 5-325 MG PO TABS: ORAL_TABLET | ORAL | 0 refills | Status: DC

## 2016-08-15 MED ORDER — DEXAMETHASONE SODIUM PHOSPHATE 4 MG/ML IJ SOLN
INTRAMUSCULAR | Status: DC | PRN
  Administered 2016-08-15: 10 mg via INTRAVENOUS

## 2016-08-15 MED ORDER — MIDAZOLAM HCL 2 MG/2ML IJ SOLN
INTRAMUSCULAR | Status: AC
  Filled 2016-08-15: qty 2

## 2016-08-15 MED ORDER — PROPOFOL 10 MG/ML IV BOLUS
INTRAVENOUS | Status: DC | PRN
  Administered 2016-08-15: 200 mg via INTRAVENOUS

## 2016-08-15 MED ORDER — MEPERIDINE HCL 25 MG/ML IJ SOLN: 6.2500 mg | INTRAMUSCULAR | Status: DC | PRN

## 2016-08-15 MED ORDER — MIDAZOLAM HCL 2 MG/2ML IJ SOLN
1.0000 mg | INTRAMUSCULAR | Status: DC | PRN
  Administered 2016-08-15: 2 mg via INTRAVENOUS

## 2016-08-15 MED ORDER — OXYCODONE HCL 5 MG PO TABS: 5.0000 mg | ORAL_TABLET | Freq: Once | ORAL | Status: DC | PRN

## 2016-08-15 MED ORDER — LACTATED RINGERS IV SOLN
INTRAVENOUS | Status: DC
  Administered 2016-08-15 (×3): via INTRAVENOUS

## 2016-08-15 MED ORDER — ONDANSETRON HCL 4 MG/2ML IJ SOLN
INTRAMUSCULAR | Status: DC | PRN
  Administered 2016-08-15: 4 mg via INTRAVENOUS

## 2016-08-15 MED ORDER — CEFAZOLIN SODIUM-DEXTROSE 2-4 GM/100ML-% IV SOLN
2.0000 g | INTRAVENOUS | Status: AC
  Administered 2016-08-15: 2 g via INTRAVENOUS

## 2016-08-15 MED ORDER — GLYCOPYRROLATE 0.2 MG/ML IJ SOLN: 0.2000 mg | Freq: Once | INTRAMUSCULAR | Status: DC | PRN

## 2016-08-15 MED ORDER — HYDROMORPHONE HCL 1 MG/ML IJ SOLN
INTRAMUSCULAR | Status: AC
  Filled 2016-08-15: qty 1

## 2016-08-15 MED ORDER — BUPIVACAINE HCL (PF) 0.25 % IJ SOLN
INTRAMUSCULAR | Status: DC | PRN
  Administered 2016-08-15: 9 mL

## 2016-08-15 MED ORDER — ONDANSETRON HCL 4 MG/2ML IJ SOLN: 4.0000 mg | Freq: Once | INTRAMUSCULAR | Status: DC | PRN

## 2016-08-15 MED ORDER — OXYCODONE HCL 5 MG/5ML PO SOLN: 5.0000 mg | Freq: Once | ORAL | Status: DC | PRN

## 2016-08-15 MED ORDER — FENTANYL CITRATE (PF) 100 MCG/2ML IJ SOLN
50.0000 ug | INTRAMUSCULAR | Status: AC | PRN
  Administered 2016-08-15 (×2): 25 ug via INTRAVENOUS
  Administered 2016-08-15: 100 ug via INTRAVENOUS

## 2016-08-15 MED ORDER — SCOPOLAMINE 1 MG/3DAYS TD PT72: 1.0000 | MEDICATED_PATCH | Freq: Once | TRANSDERMAL | Status: DC | PRN

## 2016-08-15 MED ORDER — CEFAZOLIN SODIUM-DEXTROSE 2-4 GM/100ML-% IV SOLN
INTRAVENOUS | Status: AC
  Filled 2016-08-15: qty 100

## 2016-08-15 MED ORDER — DEXAMETHASONE SODIUM PHOSPHATE 10 MG/ML IJ SOLN
INTRAMUSCULAR | Status: AC
  Filled 2016-08-15: qty 1

## 2016-08-15 MED ORDER — ONDANSETRON HCL 4 MG/2ML IJ SOLN
INTRAMUSCULAR | Status: AC
  Filled 2016-08-15: qty 2

## 2016-08-15 MED ORDER — HYDROMORPHONE HCL 1 MG/ML IJ SOLN
0.2500 mg | INTRAMUSCULAR | Status: DC | PRN
  Administered 2016-08-15 (×2): 0.5 mg via INTRAVENOUS

## 2016-08-15 MED ORDER — LIDOCAINE 2% (20 MG/ML) 5 ML SYRINGE
INTRAMUSCULAR | Status: DC | PRN
  Administered 2016-08-15: 80 mg via INTRAVENOUS

## 2016-08-15 SURGICAL SUPPLY — 60 items
BANDAGE ACE 3X5.8 VEL STRL LF (GAUZE/BANDAGES/DRESSINGS) ×3 IMPLANT
BLADE MINI RND TIP GREEN BEAV (BLADE) IMPLANT
BLADE SURG 15 STRL LF DISP TIS (BLADE) ×4 IMPLANT
BLADE SURG 15 STRL SS (BLADE) ×6
BNDG CMPR 9X4 STRL LF SNTH (GAUZE/BANDAGES/DRESSINGS) ×2
BNDG COHESIVE 1X5 TAN STRL LF (GAUZE/BANDAGES/DRESSINGS) ×2 IMPLANT
BNDG ELASTIC 2X5.8 VLCR STR LF (GAUZE/BANDAGES/DRESSINGS) IMPLANT
BNDG ESMARK 4X9 LF (GAUZE/BANDAGES/DRESSINGS) ×3 IMPLANT
BNDG GAUZE ELAST 4 BULKY (GAUZE/BANDAGES/DRESSINGS) ×3 IMPLANT
CHLORAPREP W/TINT 26ML (MISCELLANEOUS) ×3 IMPLANT
CORDS BIPOLAR (ELECTRODE) ×3 IMPLANT
COVER BACK TABLE 60X90IN (DRAPES) ×3 IMPLANT
COVER MAYO STAND STRL (DRAPES) ×3 IMPLANT
CUFF TOURNIQUET SINGLE 18IN (TOURNIQUET CUFF) ×3 IMPLANT
DRAPE EXTREMITY T 121X128X90 (DRAPE) ×3 IMPLANT
DRAPE OEC MINIVIEW 54X84 (DRAPES) ×3 IMPLANT
DRAPE SURG 17X23 STRL (DRAPES) ×3 IMPLANT
GAUZE SPONGE 4X4 12PLY STRL (GAUZE/BANDAGES/DRESSINGS) ×3 IMPLANT
GAUZE XEROFORM 1X8 LF (GAUZE/BANDAGES/DRESSINGS) ×3 IMPLANT
GLOVE BIO SURGEON STRL SZ7.5 (GLOVE) ×3 IMPLANT
GLOVE BIOGEL M STRL SZ7.5 (GLOVE) ×2 IMPLANT
GLOVE BIOGEL PI IND STRL 8 (GLOVE) ×3 IMPLANT
GLOVE BIOGEL PI IND STRL 8.5 (GLOVE) ×1 IMPLANT
GLOVE BIOGEL PI INDICATOR 8 (GLOVE) ×2
GLOVE BIOGEL PI INDICATOR 8.5 (GLOVE) ×1
GLOVE SURG ORTHO 8.0 STRL STRW (GLOVE) ×2 IMPLANT
GOWN STRL REUS W/ TWL LRG LVL3 (GOWN DISPOSABLE) ×1 IMPLANT
GOWN STRL REUS W/ TWL XL LVL3 (GOWN DISPOSABLE) ×2 IMPLANT
GOWN STRL REUS W/TWL LRG LVL3 (GOWN DISPOSABLE)
GOWN STRL REUS W/TWL XL LVL3 (GOWN DISPOSABLE) ×8 IMPLANT
K-WIRE PROS .028 4 (WIRE) ×2 IMPLANT
NDL HYPO 25X1 1.5 SAFETY (NEEDLE) IMPLANT
NEEDLE HYPO 22GX1.5 SAFETY (NEEDLE) ×2 IMPLANT
NEEDLE HYPO 25X1 1.5 SAFETY (NEEDLE) ×3 IMPLANT
NS IRRIG 1000ML POUR BTL (IV SOLUTION) ×3 IMPLANT
PACK BASIN DAY SURGERY FS (CUSTOM PROCEDURE TRAY) ×3 IMPLANT
PAD CAST 3X4 CTTN HI CHSV (CAST SUPPLIES) ×2 IMPLANT
PAD CAST 4YDX4 CTTN HI CHSV (CAST SUPPLIES) ×2 IMPLANT
PADDING CAST ABS 4INX4YD NS (CAST SUPPLIES) ×1
PADDING CAST ABS COTTON 4X4 ST (CAST SUPPLIES) ×2 IMPLANT
PADDING CAST COTTON 3X4 STRL (CAST SUPPLIES) ×3
PADDING CAST COTTON 4X4 STRL (CAST SUPPLIES) ×3
SLEEVE SCD COMPRESS KNEE MED (MISCELLANEOUS) ×2 IMPLANT
SPLINT FINGER 3.25 911903 (SOFTGOODS) ×2 IMPLANT
SPLINT PLASTER CAST XFAST 3X15 (CAST SUPPLIES) IMPLANT
SPLINT PLASTER CAST XFAST 4X15 (CAST SUPPLIES) IMPLANT
SPLINT PLASTER XTRA FAST SET 4 (CAST SUPPLIES)
SPLINT PLASTER XTRA FASTSET 3X (CAST SUPPLIES)
STOCKINETTE 4X48 STRL (DRAPES) ×3 IMPLANT
SUT CHROMIC 4 0 PS 2 18 (SUTURE) ×3 IMPLANT
SUT ETHILON 3 0 PS 1 (SUTURE) IMPLANT
SUT ETHILON 4 0 PS 2 18 (SUTURE) ×3 IMPLANT
SUT MERSILENE 4 0 P 3 (SUTURE) IMPLANT
SUT VIC AB 3-0 PS1 18 (SUTURE)
SUT VIC AB 3-0 PS1 18XBRD (SUTURE) IMPLANT
SUT VICRYL 4-0 PS2 18IN ABS (SUTURE) ×3 IMPLANT
SYR BULB 3OZ (MISCELLANEOUS) ×3 IMPLANT
SYR CONTROL 10ML LL (SYRINGE) ×2 IMPLANT
TOWEL OR 17X24 6PK STRL BLUE (TOWEL DISPOSABLE) ×3 IMPLANT
UNDERPAD 30X30 (UNDERPADS AND DIAPERS) ×3 IMPLANT

## 2016-08-15 NOTE — Anesthesia Procedure Notes (Signed)
Procedure Name: Intubation Date/Time: 08/15/2016 2:30 PM Performed by: Gar GibbonKEETON, Batu Cassin S Pre-anesthesia Checklist: Patient identified, Emergency Drugs available, Suction available and Patient being monitored Patient Re-evaluated:Patient Re-evaluated prior to inductionOxygen Delivery Method: Circle system utilized Preoxygenation: Pre-oxygenation with 100% oxygen Intubation Type: IV induction Ventilation: Mask ventilation without difficulty LMA: LMA inserted LMA Size: 4.0 Placement Confirmation: breath sounds checked- equal and bilateral and positive ETCO2 Tube secured with: Tape Dental Injury: Teeth and Oropharynx as per pre-operative assessment

## 2016-08-15 NOTE — Discharge Instructions (Addendum)

## 2016-08-15 NOTE — Op Note (Signed)
[id number] 

## 2016-08-15 NOTE — Anesthesia Preprocedure Evaluation (Signed)
Anesthesia Evaluation  Patient identified by MRN, date of birth, ID band Patient awake    Reviewed: Allergy & Precautions, NPO status , Patient's Chart, lab work & pertinent test results  Airway Mallampati: I  TM Distance: >3 FB Neck ROM: Full    Dental  (+) Teeth Intact, Dental Advisory Given   Pulmonary asthma ,  breath sounds clear to auscultation        Cardiovascular hypertension, Pt. on medications Rhythm:Regular Rate:Normal     Neuro/Psych    GI/Hepatic GERD-  Medicated and Controlled,  Endo/Other    Renal/GU      Musculoskeletal   Abdominal   Peds  Hematology   Anesthesia Other Findings   Reproductive/Obstetrics                             Anesthesia Physical Anesthesia Plan  ASA: II  Anesthesia Plan: General   Post-op Pain Management:    Induction: Intravenous  Airway Management Planned: LMA  Additional Equipment:   Intra-op Plan:   Post-operative Plan: Extubation in OR  Informed Consent: I have reviewed the patients History and Physical, chart, labs and discussed the procedure including the risks, benefits and alternatives for the proposed anesthesia with the patient or authorized representative who has indicated his/her understanding and acceptance.   Dental advisory given  Plan Discussed with: CRNA, Anesthesiologist and Surgeon  Anesthesia Plan Comments:         Anesthesia Quick Evaluation  

## 2016-08-15 NOTE — Op Note (Signed)
I assisted Surgeon(s) and Role:    * Betha LoaKevin Chai Routh, MD - Primary    * Cindee SaltGary Eun Vermeer, MD - Assisting on the Procedure(s): OPEN REDUCTION INTERNAL FIXATION (ORIF) right ring finger middle phalanx fracture on 08/15/2016.  I provided assistance on this case as follows: approach debridement and reduction of the fracture,stabilization and fixation of the fracture. Application of the dressing and splint. I was present for the entire case.  Electronically signed by: Nicki ReaperKUZMA,Dorell Gatlin R, MD Date: 08/15/2016 Time: 3:20 PM

## 2016-08-15 NOTE — H&P (Signed)
  Wynell BalloonChristine S Heitzenrater is an 50 y.o. female.   Chief Complaint: right ring finger fracture HPI: 50 yo female states she caught right ring finger in weedeater handle 08/07/16 and finger was twisted.  Seen in ED where XR revealed middle phalanx fracture.  Splinted and followed up in office.  She reports no previous injury to finger.  Visible deviation of finger notable.  Allergies:  Allergies  Allergen Reactions  . Aspirin Shortness Of Breath    REACTION: wheezing problems  . Codeine Sulfate Hives and Nausea And Vomiting    REACTION: rash, itiching  . Hydrocodone-Acetaminophen Itching and Rash    Past Medical History:  Diagnosis Date  . Asthma   . Blood transfusion   . Depression   . GERD (gastroesophageal reflux disease)   . Hepatitis A   . Hypertension   . Jaundice   . Migraines   . Ovarian cyst   . UTI (urinary tract infection)     Past Surgical History:  Procedure Laterality Date  . APPENDECTOMY  1994  . OVARIAN CYST REMOVAL    . TONSILLECTOMY AND ADENOIDECTOMY      Family History: Family History  Problem Relation Age of Onset  . Asthma Son   . Hypertension Other   . Crohn's disease Father   . Crohn's disease Maternal Aunt   . Esophageal cancer Maternal Uncle   . Breast cancer Maternal Grandmother     Social History:   reports that she has never smoked. She has never used smokeless tobacco. She reports that she drinks alcohol. She reports that she does not use drugs.  Medications: No prescriptions prior to admission.    No results found for this or any previous visit (from the past 48 hour(s)).  No results found.   A comprehensive review of systems was negative.  Height 5\' 6"  (1.676 m), weight 67.1 kg (148 lb), last menstrual period 07/27/2016.  General appearance: alert, cooperative and appears stated age Head: Normocephalic, without obvious abnormality, atraumatic Neck: supple, symmetrical, trachea midline Resp: clear to auscultation  bilaterally Cardio: regular rate and rhythm GI: non-tender Extremities: Intact sensation and capillary refill all digits.  +epl/fpl/io.  No wounds.  Pulses: 2+ and symmetric Skin: Skin color, texture, turgor normal. No rashes or lesions Neurologic: Grossly normal Incision/Wound:none  Assessment/Plan Right ring finger middle phalanx fracture.  Non operative and operative treatment options were discussed with the patient and patient wishes to proceed with operative treatment. Risks, benefits, and alternatives of surgery were discussed and the patient agrees with the plan of care.   Denetria Luevanos R 08/15/2016, 10:47 AM

## 2016-08-15 NOTE — Brief Op Note (Signed)
08/15/2016  3:28 PM  PATIENT:  [name]  50 y.o. female  PRE-OPERATIVE DIAGNOSIS:  displaced intra-articular fracture distal aspect right ring finger middle phalanx  POST-OPERATIVE DIAGNOSIS:  distal aspect right ring finger middle phalanx  PROCEDURE:  Procedure(s): OPEN REDUCTION INTERNAL FIXATION (ORIF) right ring finger middle phalanx fracture (Right)  SURGEON:  Surgeon(s) and Role:    * Betha LoaKevin Rosaland Shiffman, MD - Primary    * Cindee SaltGary Chivas Notz, MD - Assisting  PHYSICIAN ASSISTANT:   ASSISTANTS: Cindee SaltGary Caya Soberanis, MD   ANESTHESIA:   general  EBL:  Total I/O In: 900 [I.V.:900] Out: -   BLOOD ADMINISTERED:none  DRAINS: none   LOCAL MEDICATIONS USED:  MARCAINE     SPECIMEN:  No Specimen  DISPOSITION OF SPECIMEN:  N/A  COUNTS:  YES  TOURNIQUET:   Total Tourniquet Time Documented: Upper Arm (laterality) - 31 minutes Total: Upper Arm (laterality) - 31 minutes   DICTATION: .Other Dictation: Dictation Number [id number]  PLAN OF CARE: Discharge to home after PACU  PATIENT DISPOSITION:  PACU - hemodynamically stable.

## 2016-08-15 NOTE — Transfer of Care (Signed)
Immediate Anesthesia Transfer of Care Note  Patient: [name] BalloonChristine S Saksa  Procedure(s) Performed: Procedure(s): OPEN REDUCTION INTERNAL FIXATION (ORIF) right ring finger middle phalanx fracture (Right)  Patient Location: PACU  Anesthesia Type:General  Level of Consciousness: awake, sedated and patient cooperative  Airway & Oxygen Therapy: Patient Spontanous Breathing and Patient connected to face mask oxygen  Post-op Assessment: Report given to RN and Post -op Vital signs reviewed and stable  Post vital signs: Reviewed and stable  Last Vitals:  Vitals:   08/15/16 1300  BP: (!) 151/99  Pulse: 91  Resp: 20  Temp: 36.8 C    Last Pain:  Vitals:   08/15/16 1300  TempSrc: Oral         Complications: No apparent anesthesia complications

## 2016-08-16 ENCOUNTER — Encounter (HOSPITAL_BASED_OUTPATIENT_CLINIC_OR_DEPARTMENT_OTHER): Payer: Self-pay | Admitting: Orthopedic Surgery

## 2016-08-16 ENCOUNTER — Telehealth: Payer: Self-pay | Admitting: Family Medicine

## 2016-08-16 NOTE — Telephone Encounter (Signed)
We cannot continue to refill controlled medications that are lost.  Please send out regrets that this was lost but pt must be responsible for controlled prescriptions.

## 2016-08-16 NOTE — Telephone Encounter (Signed)
Pt state that she is missing the Adderall 10 Rx and would like to see if you could do it so that she may have it for the weekend.

## 2016-08-16 NOTE — Telephone Encounter (Signed)
Last refill 08/12/2016 #30, 0rf Please advise

## 2016-08-16 NOTE — Anesthesia Postprocedure Evaluation (Signed)
Anesthesia Post Note  Patient: [name]  Procedure(s) Performed: Procedure(s) (LRB): OPEN REDUCTION INTERNAL FIXATION (ORIF) right ring finger middle phalanx fracture (Right)  Patient location during evaluation: PACU Anesthesia Type: General Level of consciousness: awake and alert Pain management: pain level controlled Vital Signs Assessment: post-procedure vital signs reviewed and stable Respiratory status: spontaneous breathing, nonlabored ventilation and respiratory function stable Cardiovascular status: blood pressure returned to baseline and stable Postop Assessment: no signs of nausea or vomiting Anesthetic complications: no    Last Vitals:  Vitals:   08/15/16 1557 08/15/16 1618  BP: (!) 163/89 (!) 151/88  Pulse: 81 80  Resp: 15 16  Temp:  36.9 C    Last Pain:  Vitals:   08/16/16 0942  TempSrc:   PainSc: 2                  Jwan Hornbaker A

## 2016-08-16 NOTE — Telephone Encounter (Signed)
Tried calling pt with NA and VM is full. 

## 2016-08-16 NOTE — Telephone Encounter (Signed)
Pt did not lose the Rx she stated that it was not given to her for pick up she normally gets two and she only got one Adderall Rx

## 2016-08-16 NOTE — Op Note (Signed)
NAMESHONNIE, POUDRIER           ACCOUNT NO.:  [id number]  MEDICAL RECORD NO.:  [id number]  LOCATION:                                 FACILITY:  PHYSICIAN:  Betha Loa, MD        DATE OF BIRTH:  1966/05/11  DATE OF PROCEDURE:  08/15/2016 DATE OF DISCHARGE:                              OPERATIVE REPORT   PREOPERATIVE DIAGNOSIS:  Right ring finger middle phalanx condyle fracture, intra-articular at DIP joint.  POSTOPERATIVE DIAGNOSIS:  Right ring finger middle phalanx condyle fracture, intra-articular at DIP joint.  PROCEDURE:  Open reduction and pin fixation of right ring finger middle phalanx fracture, intra-articular at DIP joint.  SURGEON:  Betha Loa, MD.  ASSISTANT:  Cindee Salt, MD.  ANESTHESIA:  General.  IV FLUIDS:  Per Anesthesia flow sheet.  ESTIMATED BLOOD LOSS:  Minimal.  COMPLICATIONS:  None.  SPECIMENS:  None.  TOURNIQUET TIME:  31 minutes.  DISPOSITION:  Stable to PACU.  INDICATIONS:  Ms. Meece is a 50 year old female, who states she injured her right ring finger approximately a week ago when a weed eater twisted causing a twisting injury to the finger.  She was seen at the emergency department where radiographs were taken revealing a fracture of the middle phalanx of the right ring finger.  She was splinted and followed up in the office.  I recommended operative fixation.  Risks, benefits, and alternatives of surgery were discussed including the risk of blood loss; infection; damage to nerves, vessels, tendons, ligaments, bone; failure of surgery; need for additional surgery; complications with wound healing; continued pain; nonunion; malunion; stiffness.  She voiced understanding of these risks and elected to proceed.  OPERATIVE COURSE:  After being identified preoperatively by myself, the patient agreed upon procedure and site of procedure.  Surgical site was marked.  The risks, benefits, and alternatives of surgery were reviewed, and she  wished to proceed.  Surgical consent had been signed.  She was given IV Ancef as preoperative antibiotic prophylaxis.  She was transferred to the operating room and placed on the operating room table in supine position with the right upper extremity on arm board.  General anesthesia induced by Anesthesiology.  Right upper extremity was prepped and draped in normal sterile orthopedic fashion.  A surgical pause was performed between surgeons, anesthesia, operating room staff and all were in agreement as to the patient, procedure, and site of procedure. Tourniquet at the proximal aspect of the extremity was inflated to 250 mmHg after exsanguination of the limb with an Esmarch bandage.  C-arm was used in AP and lateral projections throughout the case to aid in reduction and positioning of hardware.  Attempts at closed reduction were unsuccessful.  An incision was made in the mid lateral line of the radial side of the ring finger at the level of the DIP joint.  This was carried down to subcutaneous tissues by spreading technique.  Care was taken to protect all neurovascular structures.  Periosteum was sharply incised.  The fracture was identified.  It was cleared of soft tissue interposition.  It had a saw-tooth type pattern to it.  Once reduction was obtained, the fracture was relatively stable.  It was  felt that a pin fixation would be appropriate.  A 0.028 inch K-wire was then advanced across the fracture fragment at the condyle and out the other side.  This was then backed out so that the pin would be exiting on the opposite side of the incision.  The wound was then copiously irrigated with sterile saline.  C-arm was used in AP and lateral projections to ensure appropriate reduction and position of hardware, which was the case.  The fracture was stable.  The wound was then closed with 4-0 nylon in a horizontal mattress fashion.  A digital block was performed with 10 mL of 0.25% plain  Marcaine to aid in postoperative analgesia. The wound was dressed with sterile Xeroform, 4x4, and wrapped with a Coban dressing lightly.  An AlumaFoam splint was placed and wrapped lightly with a Coban dressing.  Tourniquet was deflated at 31 minutes. Fingertips were pink with brisk capillary refill after deflation of tourniquet.  Operative drapes were broken down.  The patient was awakened from anesthesia safely.  She was transferred back to stretcher and taken to PACU in stable condition.  I will see her back in the office in 1 week for postoperative followup.  I will give Norco 5/325 mg 1 to 2 p.o. q.6 hours p.r.n. pain, dispensed #20.     Betha LoaKevin Efrat Zuidema, MD     KK/MEDQ  D:  08/15/2016  T:  08/16/2016  Job:  [id number]

## 2016-08-20 NOTE — Telephone Encounter (Signed)
Pt gets prescriptions month to month due to the frequency of losing them.

## 2016-08-21 MED ORDER — AMPHETAMINE-DEXTROAMPHET ER 20 MG PO CP24: 20.0000 mg | ORAL_CAPSULE | Freq: Every day | ORAL | 0 refills | Status: DC

## 2016-08-21 NOTE — Telephone Encounter (Signed)
Per pt she gets 2 scripts at a time. Is this okay to print another one? Last one was given on 10/9 so I could predate it to 11/09.

## 2016-08-21 NOTE — Telephone Encounter (Signed)
Pt calling to check the status.  I spoke with Autumn and she will check on this with Dr. Caryl NeverBurchette.

## 2016-08-21 NOTE — Telephone Encounter (Signed)
Pt is aware that script has been printed and up front for pick up.

## 2016-08-21 NOTE — Telephone Encounter (Signed)
OK 

## 2016-08-23 ENCOUNTER — Telehealth: Payer: Self-pay | Admitting: Family Medicine

## 2016-08-23 MED ORDER — AMPHETAMINE-DEXTROAMPHETAMINE 10 MG PO TABS: ORAL_TABLET | ORAL | 0 refills | Status: DC

## 2016-08-23 NOTE — Telephone Encounter (Signed)
Pt states she is supposed to have a new rx amphetamine-dextroamphetamine (ADDERALL, 10MG ,) 10 MG tablet  Pt states dr Caryl Neverburchette changed her adderall and now she takes the 10 mg in the afternoon along with a 20 mg XR in the morning  Pt is going to be out of her 20 mg adderall now because she has been taking that instead of the 10 in the afternoon. Pt states now all her adderall scripts are messed up. Pt did not need the 20 mg she picked up yesterday, it was to be 10 mg  Please advise

## 2016-08-23 NOTE — Telephone Encounter (Signed)
Printed

## 2016-08-23 NOTE — Telephone Encounter (Signed)
Script printed, signed and up front for pick up. Pt is aware. 

## 2016-09-02 ENCOUNTER — Telehealth: Payer: Self-pay | Admitting: Family Medicine

## 2016-09-02 ENCOUNTER — Other Ambulatory Visit: Payer: Self-pay | Admitting: Family Medicine

## 2016-09-02 NOTE — Telephone Encounter (Signed)
Pt states when her adderall 10 mg rx got delayed, she had to take extra of the amphetamine-dextroamphetamine (ADDERALL XR) 20 MG 24 hr capsule  Pt was given rx, but it is too early for a refill. Pt would like you to call and approve an early refill, then she can stay on track after that. rx is dated to be filled 11/09  [facility] / [address]

## 2016-09-03 NOTE — Telephone Encounter (Signed)
Should not be taking > one Adderall XR per day.   May refill early this one time.

## 2016-09-04 MED ORDER — AMPHETAMINE-DEXTROAMPHET ER 20 MG PO CP24: 20.0000 mg | ORAL_CAPSULE | Freq: Every day | ORAL | 0 refills | Status: DC

## 2016-09-04 NOTE — Telephone Encounter (Signed)
Can you print this for me

## 2016-09-06 NOTE — Telephone Encounter (Signed)
Spoke with Arlys JohnBrian @ [facility] and ok'd refill on Adderal. Nothing further needed.

## 2016-09-06 NOTE — Telephone Encounter (Signed)
The same rx was printed as before amphetamine-dextroamphetamine (ADDERALL XR) 20 MG 24 hr capsule with the same date 11/09. and pt is leaving to go out of town at noon. Pt states pharmacy said ok to call and approve early refill.   [facility]/ [address]

## 2016-09-11 ENCOUNTER — Ambulatory Visit: Payer: BLUE CROSS/BLUE SHIELD | Admitting: Family Medicine

## 2016-09-11 ENCOUNTER — Ambulatory Visit: Payer: Self-pay | Admitting: Family Medicine

## 2016-09-11 DIAGNOSIS — Z0289 Encounter for other administrative examinations: Secondary | ICD-10-CM

## 2016-09-18 ENCOUNTER — Telehealth: Payer: Self-pay | Admitting: Family Medicine

## 2016-09-18 NOTE — Telephone Encounter (Signed)
Please advise on early refill. Looks like she had rescheduled f/u for 11/29 at 430 but we cancelled due to you leaving early. She has not scheduled any other appt.

## 2016-09-18 NOTE — Telephone Encounter (Signed)
Pt request refill  amphetamine-dextroamphetamine (ADDERALL) 10 MG tablet  Pt states it is due on 10/20, but she is going out of town this weekend and would like to pick up Friday afternoon.

## 2016-09-19 NOTE — Telephone Encounter (Signed)
Refill OK

## 2016-09-20 MED ORDER — AMPHETAMINE-DEXTROAMPHETAMINE 10 MG PO TABS: ORAL_TABLET | ORAL | 0 refills | Status: DC

## 2016-09-20 NOTE — Telephone Encounter (Signed)
Pt is aware that script up front for pick up.

## 2016-10-02 ENCOUNTER — Ambulatory Visit: Payer: Self-pay | Admitting: Family Medicine

## 2016-10-04 ENCOUNTER — Other Ambulatory Visit: Payer: Self-pay | Admitting: Family Medicine

## 2016-10-11 ENCOUNTER — Other Ambulatory Visit: Payer: Self-pay

## 2016-10-11 MED ORDER — AMPHETAMINE-DEXTROAMPHET ER 20 MG PO CP24: 20.0000 mg | ORAL_CAPSULE | Freq: Every day | ORAL | 0 refills | Status: DC

## 2016-10-22 ENCOUNTER — Other Ambulatory Visit: Payer: Self-pay

## 2016-10-22 MED ORDER — AMPHETAMINE-DEXTROAMPHETAMINE 10 MG PO TABS: ORAL_TABLET | ORAL | 0 refills | Status: DC

## 2016-11-02 ENCOUNTER — Emergency Department (HOSPITAL_COMMUNITY)
Admission: EM | Admit: 2016-11-02 | Discharge: 2016-11-02 | Disposition: A | Discharge status: Home / Self Care | Payer: BLUE CROSS/BLUE SHIELD | Attending: Emergency Medicine | Admitting: Emergency Medicine

## 2016-11-02 ENCOUNTER — Emergency Department (HOSPITAL_COMMUNITY): Payer: BLUE CROSS/BLUE SHIELD

## 2016-11-02 ENCOUNTER — Encounter (HOSPITAL_COMMUNITY): Payer: Self-pay

## 2016-11-02 DIAGNOSIS — B9789 Other viral agents as the cause of diseases classified elsewhere: Secondary | ICD-10-CM

## 2016-11-02 DIAGNOSIS — I10 Essential (primary) hypertension: Secondary | ICD-10-CM | POA: Diagnosis not present

## 2016-11-02 DIAGNOSIS — Z79899 Other long term (current) drug therapy: Secondary | ICD-10-CM | POA: Diagnosis not present

## 2016-11-02 DIAGNOSIS — J069 Acute upper respiratory infection, unspecified: Secondary | ICD-10-CM

## 2016-11-02 DIAGNOSIS — J45909 Unspecified asthma, uncomplicated: Secondary | ICD-10-CM | POA: Diagnosis present

## 2016-11-02 DIAGNOSIS — J45901 Unspecified asthma with (acute) exacerbation: Secondary | ICD-10-CM | POA: Diagnosis not present

## 2016-11-02 MED ORDER — PREDNISONE 20 MG PO TABS
60.0000 mg | ORAL_TABLET | Freq: Once | ORAL | Status: AC
  Administered 2016-11-02: 60 mg via ORAL
  Filled 2016-11-02: qty 3

## 2016-11-02 MED ORDER — PREDNISONE 20 MG PO TABS: 40.0000 mg | ORAL_TABLET | Freq: Every day | ORAL | 0 refills | Status: DC

## 2016-11-02 MED ORDER — ALBUTEROL SULFATE (2.5 MG/3ML) 0.083% IN NEBU
5.0000 mg | INHALATION_SOLUTION | Freq: Once | RESPIRATORY_TRACT | Status: AC
  Administered 2016-11-02: 5 mg via RESPIRATORY_TRACT
  Filled 2016-11-02: qty 6

## 2016-11-02 NOTE — ED Triage Notes (Signed)
PT C/O A PRODUCTIVE COUGH X1 1/2 WEEKS. THE WHEEZING STARTED 2 DAYS AGO.PT STS SHE HAS BEEN USING HER INHALER W/O RELIEF. DENIES CHEST PAIN OR FEVER.

## 2016-11-02 NOTE — ED Provider Notes (Signed)
[facility]-EMERGENCY DEPT Provider Note   CSN: [id number] Arrival date & time: 11/02/16  2056     History   Chief Complaint Chief Complaint  Patient presents with  . Asthma    HPI [name] is a 50 y.o. female who presents with URI symptoms and wheezing. Past medical history significant for asthma. She states she has been having cold symptoms for the past week. She reports some nasal congestion and a mild productive cough. She started having wheezing 2 days ago. Denies fever, chills, ear pain, rhinorrhea, sore throat, chest pain, shortness of breath. She states she has been using her inhaler more which has provided some relief however she states she usually needs prednisone when she has a cold and starts wheezing.  HPI  Past Medical History:  Diagnosis Date  . Asthma   . Blood transfusion   . Depression   . GERD (gastroesophageal reflux disease)   . Hepatitis A   . Hypertension   . Jaundice   . Migraines   . Ovarian cyst   . UTI (urinary tract infection)     Patient Active Problem List   Diagnosis Date Noted  . ALLERGIC RHINITIS 01/29/2010  . Attention deficit disorder 12/14/2009  . Anxiety state 10/23/2009  . Adjustment disorder with mixed anxiety and depressed mood 05/25/2009  . Essential hypertension 05/25/2009  . Asthma 05/25/2009  . GERD 05/25/2009    Past Surgical History:  Procedure Laterality Date  . APPENDECTOMY  1994  . OPEN REDUCTION INTERNAL FIXATION (ORIF) METACARPAL Right 08/15/2016   Procedure: OPEN REDUCTION INTERNAL FIXATION (ORIF) right ring finger middle phalanx fracture;  Surgeon: Betha LoaKevin Kuzma, MD;  Location: [facility];  Service: Orthopedics;  Laterality: Right;  . OVARIAN CYST REMOVAL    . TONSILLECTOMY AND ADENOIDECTOMY      OB History    No data available       Home Medications    Prior to Admission medications   Medication Sig Start Date End Date Taking? Authorizing Provider  albuterol (PROVENTIL) (2.5 MG/3ML)  0.083% nebulizer solution Take 3 mLs (2.5 mg total) by nebulization every 6 (six) hours as needed for wheezing or shortness of breath. 06/14/16   Kristian CoveyBruce W Burchette, MD  amphetamine-dextroamphetamine (ADDERALL XR) 20 MG 24 hr capsule Take 1 capsule (20 mg total) by mouth daily. 10/11/16   Kristian CoveyBruce W Burchette, MD  amphetamine-dextroamphetamine (ADDERALL) 10 MG tablet Take One Tablet After Lunch Daily. 10/22/16   Kristian CoveyBruce W Burchette, MD  HYDROcodone-acetaminophen [facility](NORCO) 5-325 MG tablet 1-2 tabs po q6 hours prn pain 08/15/16   Betha LoaKevin Kuzma, MD  losartan-hydrochlorothiazide [facility](HYZAAR) 100-12.5 MG tablet Take 1 tablet by mouth daily. 10/20/15   Kristian CoveyBruce W Burchette, MD  montelukast (SINGULAIR) 10 MG tablet Take 1 tablet (10 mg total) by mouth at bedtime. 05/10/16   Kristian CoveyBruce W Burchette, MD  predniSONE (DELTASONE) 20 MG tablet Take 2 tablets (40 mg total) by mouth daily. 11/02/16   Bethel BornKelly Marie Deltha Bernales, PA-C  sertraline (ZOLOFT) 100 MG tablet Take one and a half tablets daily. 03/27/16   Kristian CoveyBruce W Burchette, MD  sertraline (ZOLOFT) 100 MG tablet take 1 tablet by mouth once daily 10/04/16   Kristian CoveyBruce W Burchette, MD  VENTOLIN HFA 108 (90 Base) MCG/ACT inhaler INHALE 2-3 PUFFS BY MOUTH EVERY 6 HOURS AS NEEDED FOR WHEEZING OR SHORTNESS OF BREATH 09/03/16   Kristian CoveyBruce W Burchette, MD    Family History Family History  Problem Relation Age of Onset  . Asthma Son   .  Hypertension Other   . Crohn's disease Father   . Crohn's disease Maternal Aunt   . Esophageal cancer Maternal Uncle   . Breast cancer Maternal Grandmother     Social History Social History  Substance Use Topics  . Smoking status: Never Smoker  . Smokeless tobacco: Never Used  . Alcohol use Yes     Comment: several times monthly     Allergies   Aspirin; Codeine sulfate; and Hydrocodone-acetaminophen   Review of Systems Review of Systems  Constitutional: Negative for chills and fever.  HENT: Positive for congestion. Negative for ear pain, rhinorrhea, sinus  pressure and sore throat.   Respiratory: Positive for cough and wheezing. Negative for shortness of breath.   Cardiovascular: Negative for chest pain.  All other systems reviewed and are negative.    Physical Exam Updated Vital Signs BP 133/82 (BP Location: Left Arm)   Pulse 77   Temp 97.9 F (36.6 C) (Oral)   Resp 14   Wt 85.7 kg   LMP 11/01/2016   SpO2 95%   BMI 30.51 kg/m   Physical Exam  Constitutional: She is oriented to person, place, and time. She appears well-developed and well-nourished. No distress.  HENT:  Head: Normocephalic and atraumatic.  Right Ear: Hearing, tympanic membrane, external ear and ear canal normal.  Left Ear: Hearing, tympanic membrane, external ear and ear canal normal.  Nose: Nose normal.  Mouth/Throat: Uvula is midline, oropharynx is clear and moist and mucous membranes are normal.  Eyes: Conjunctivae are normal. Pupils are equal, round, and reactive to light. Right eye exhibits no discharge. Left eye exhibits no discharge. No scleral icterus.  Neck: Normal range of motion.  Cardiovascular: Normal rate and regular rhythm.  Exam reveals no gallop and no friction rub.   No murmur heard. Pulmonary/Chest: Effort normal. No respiratory distress. She has wheezes. She has no rales. She exhibits no tenderness.  Mild diffuse expiratory wheezes  Abdominal: She exhibits no distension.  Neurological: She is alert and oriented to person, place, and time.  Skin: Skin is warm and dry.  Psychiatric: She has a normal mood and affect. Her behavior is normal.  Nursing note and vitals reviewed.    ED Treatments / Results  Labs (all labs ordered are listed, but only abnormal results are displayed) Labs Reviewed - No data to display  EKG  EKG Interpretation None       Radiology Dg Chest 2 View  Result Date: 11/02/2016 CLINICAL DATA:  Patient with cough for 1- 2 weeks. Wheezing. Shortness of breath. EXAM: CHEST  2 VIEW COMPARISON:  Chest radiograph  01/09/2016. FINDINGS: Normal cardiac and mediastinal contours. No consolidative pulmonary opacities. No pleural effusion or pneumothorax. Regional skeleton is unremarkable. IMPRESSION: No active cardiopulmonary disease. Electronically Signed   By: Annia Beltrew  Davis M.D.   On: 11/02/2016 21:48    Procedures Procedures (including critical care time)  Medications Ordered in ED Medications  albuterol (PROVENTIL) (2.5 MG/3ML) 0.083% nebulizer solution 5 mg (5 mg Nebulization Given 11/02/16 2124)  predniSONE (DELTASONE) tablet 60 mg (60 mg Oral Given 11/02/16 2242)     Initial Impression / Assessment and Plan / ED Course  I have reviewed the triage vital signs and the nursing notes.  Pertinent labs & imaging results that were available during my care of the patient were reviewed by me and considered in my medical decision making (see chart for details).  Clinical Course    50 year old female with mild asthma exacerbation secondary to viral URI.  Patient is afebrile, not tachycardic or tachypneic, normotensive, and not hypoxic. X-ray negative. Breathing treatment given in ED as well as dose of prednisone. Patient states she has enough albuterol at home. We'll discharge with steroid burst and given return precautions.  Final Clinical Impressions(s) / ED Diagnoses   Final diagnoses:  Mild asthma with exacerbation, unspecified whether persistent  Viral URI with cough    New Prescriptions Discharge Medication List as of 11/02/2016 10:36 PM    START taking these medications   Details  predniSONE (DELTASONE) 20 MG tablet Take 2 tablets (40 mg total) by mouth daily., Starting Sat 11/02/2016, Print         Bethel Born, PA-C 11/02/16 2255    Charlynne Pander, MD 11/03/16 1022

## 2016-11-14 ENCOUNTER — Telehealth: Payer: Self-pay | Admitting: Family Medicine

## 2016-11-14 NOTE — Telephone Encounter (Signed)
° ° ° °  Pt request refill of the following: ° ° °amphetamine-dextroamphetamine (ADDERALL XR) 20 MG 24 hr capsule ° ° °Phamacy: °

## 2016-11-15 MED ORDER — AMPHETAMINE-DEXTROAMPHET ER 20 MG PO CP24: 20.0000 mg | ORAL_CAPSULE | Freq: Every day | ORAL | 0 refills | Status: DC

## 2016-11-15 NOTE — Telephone Encounter (Signed)
Last OV 10-11-2016 #30 Please advise

## 2016-11-15 NOTE — Telephone Encounter (Signed)
Printed for signature

## 2016-11-15 NOTE — Telephone Encounter (Signed)
Refills OK. 

## 2016-11-15 NOTE — Telephone Encounter (Signed)
Pt is aware via voicemail that script is up front for pick up. 

## 2016-11-22 ENCOUNTER — Telehealth: Payer: Self-pay | Admitting: Family Medicine

## 2016-11-22 MED ORDER — AMPHETAMINE-DEXTROAMPHETAMINE 10 MG PO TABS: ORAL_TABLET | ORAL | 0 refills | Status: DC

## 2016-11-22 NOTE — Telephone Encounter (Signed)
Pt needs new rx generic adderall 10 mg °

## 2016-11-22 NOTE — Telephone Encounter (Signed)
Refill OK

## 2016-11-22 NOTE — Telephone Encounter (Signed)
Last OV 04-17-2016 Last refill 10/22/2016 x1 Please advise

## 2016-11-22 NOTE — Telephone Encounter (Signed)
Pt is aware that script is up front for pick up.  

## 2016-11-27 ENCOUNTER — Other Ambulatory Visit: Payer: Self-pay | Admitting: Family Medicine

## 2016-12-02 ENCOUNTER — Other Ambulatory Visit: Payer: Self-pay | Admitting: Family Medicine

## 2016-12-12 ENCOUNTER — Other Ambulatory Visit: Payer: Self-pay | Admitting: Family Medicine

## 2016-12-12 NOTE — Telephone Encounter (Signed)
Pt request refill  °amphetamine-dextroamphetamine (ADDERALL XR) 20 MG 24 hr capsule ° ° °

## 2016-12-13 MED ORDER — AMPHETAMINE-DEXTROAMPHET ER 20 MG PO CP24: 20.0000 mg | ORAL_CAPSULE | Freq: Every day | ORAL | 0 refills | Status: DC

## 2016-12-13 NOTE — Telephone Encounter (Signed)
Refill once, but needs office follow up (to reassess BP)

## 2016-12-13 NOTE — Telephone Encounter (Signed)
Spoke with pt to let her know that her rx was ready also scheduled pt for a f/u appt to check blood pressure

## 2016-12-13 NOTE — Telephone Encounter (Signed)
Pt would like a refill. Last OV was 04/17/2016. Please advise.

## 2016-12-16 ENCOUNTER — Telehealth: Payer: Self-pay | Admitting: Family Medicine

## 2016-12-16 NOTE — Telephone Encounter (Signed)
Refill once.  Should not be over-using.  Needs office follow  Up if asthma that poorly controlled.

## 2016-12-16 NOTE — Telephone Encounter (Signed)
This was last refilled on 11/28/2015. Please advise if okay for early refill?

## 2016-12-16 NOTE — Telephone Encounter (Signed)
Pt request refill  VENTOLIN HFA 108 (90 Base) MCG/ACT inhaler  Pt states she has had bronchitis this past few weeks and had to used her inhaler a lot more. Now the pharmacy will not refill until 2/24.  Pt would like approval for an early refill.  Pt states she cannot breathe very well without  [facility] - [place], [place] - [address]

## 2016-12-17 NOTE — Telephone Encounter (Signed)
Pt is aware via voicemail that script can be filled on Thursday 12/19/2016.

## 2016-12-19 ENCOUNTER — Telehealth: Payer: Self-pay | Admitting: Family Medicine

## 2016-12-19 NOTE — Telephone Encounter (Signed)
° ° ° °  Pt request refill of the following: ° °amphetamine-dextroamphetamine (ADDERALL) 10 MG tablet ° ° °Phamacy: °

## 2016-12-20 NOTE — Telephone Encounter (Signed)
Pt is due 12/23/2016.

## 2016-12-20 NOTE — Telephone Encounter (Signed)
Pt calling to check the status of the Rx. °

## 2016-12-23 MED ORDER — AMPHETAMINE-DEXTROAMPHETAMINE 10 MG PO TABS: ORAL_TABLET | ORAL | 0 refills | Status: DC

## 2016-12-23 NOTE — Telephone Encounter (Signed)
Pt is aware via voicemail that script is up front for pick up. 

## 2016-12-25 ENCOUNTER — Ambulatory Visit (INDEPENDENT_AMBULATORY_CARE_PROVIDER_SITE_OTHER): Payer: BLUE CROSS/BLUE SHIELD | Admitting: Family Medicine

## 2016-12-25 ENCOUNTER — Encounter: Payer: Self-pay | Admitting: Family Medicine

## 2016-12-25 VITALS — BP 130/90 | HR 93

## 2016-12-25 DIAGNOSIS — J4541 Moderate persistent asthma with (acute) exacerbation: Secondary | ICD-10-CM | POA: Diagnosis not present

## 2016-12-25 DIAGNOSIS — I10 Essential (primary) hypertension: Secondary | ICD-10-CM | POA: Diagnosis not present

## 2016-12-25 MED ORDER — PREDNISONE 20 MG PO TABS: ORAL_TABLET | ORAL | 0 refills | Status: DC

## 2016-12-25 MED ORDER — BECLOMETHASONE DIPROPIONATE 80 MCG/ACT IN AERS: 2.0000 | INHALATION_SPRAY | Freq: Two times a day (BID) | RESPIRATORY_TRACT | 12 refills | Status: DC

## 2016-12-25 MED ORDER — ALBUTEROL SULFATE HFA 108 (90 BASE) MCG/ACT IN AERS: 2.0000 | INHALATION_SPRAY | RESPIRATORY_TRACT | 2 refills | Status: DC | PRN

## 2016-12-25 NOTE — Progress Notes (Signed)
Pre visit review using our clinic review tool, if applicable. No additional management support is needed unless otherwise documented below in the visit note. 

## 2016-12-25 NOTE — Progress Notes (Signed)
Subjective:     Patient ID: [name], female   DOB: Jan 06, 1966, 51 y.o.   MRN: [id number]  HPI Patient seen for follow-up regarding hypertension and asthma. She has long history of poorly controlled asthma and poor compliance with inhalers. She's been using albuterol very frequently. She takes Singulair daily. For some reason, she's not taking her steroid inhaler. Over the past several weeks has had almost daily wheezing. No fever. Occasional productive cough  Takes Hyzaar for hypertension. Not monitoring blood pressures regularly. Compliant with therapy. No headaches. No dizziness. No chest pains.  Has been dealing with tremendous stress with son who has had drug  problems and is currently in residential treatment program.  Past Medical History:  Diagnosis Date  . Asthma   . Blood transfusion   . Depression   . GERD (gastroesophageal reflux disease)   . Hepatitis A   . Hypertension   . Jaundice   . Migraines   . Ovarian cyst   . UTI (urinary tract infection)    Past Surgical History:  Procedure Laterality Date  . APPENDECTOMY  1994  . OPEN REDUCTION INTERNAL FIXATION (ORIF) METACARPAL Right 08/15/2016   Procedure: OPEN REDUCTION INTERNAL FIXATION (ORIF) right ring finger middle phalanx fracture;  Surgeon: Betha LoaKevin Kuzma, MD;  Location: [facility];  Service: Orthopedics;  Laterality: Right;  . OVARIAN CYST REMOVAL    . TONSILLECTOMY AND ADENOIDECTOMY      reports that she has never smoked. She has never used smokeless tobacco. She reports that she drinks alcohol. She reports that she does not use drugs. family history includes Asthma in her son; Breast cancer in her maternal grandmother; Crohn's disease in her father and maternal aunt; Esophageal cancer in her maternal uncle; Hypertension in her other. Allergies  Allergen Reactions  . Aspirin Shortness Of Breath    REACTION: wheezing problems  . Codeine Sulfate Hives and Nausea And Vomiting    REACTION:  rash, itiching  . Hydrocodone-Acetaminophen Itching and Rash     Review of Systems  Constitutional: Positive for fatigue.  Eyes: Negative for visual disturbance.  Respiratory: Positive for cough and wheezing. Negative for chest tightness and shortness of breath.   Cardiovascular: Negative for chest pain, palpitations and leg swelling.  Neurological: Negative for dizziness, seizures, syncope, weakness, light-headedness and headaches.       Objective:   Physical Exam  Constitutional: She appears well-developed and well-nourished.  Neck: Neck supple. No thyromegaly present.  Cardiovascular: Normal rate and regular rhythm.   Pulmonary/Chest: Effort normal.  She has some diffuse expiratory wheezes. No rales. No respiratory distress.  Musculoskeletal: She exhibits no edema.  Lymphadenopathy:    She has no cervical adenopathy.       Assessment:     #1 hypertension improved by repeat reading but borderline elevated diastolic  #2 asthma at least moderate persistent poorly controlled with acute exacerbation and in no distress.    Plan:     -Start Qvar 80 mg 2 puffs twice daily and rinse mouth after use -prednisone 20 mg two tablets daily for 5 days -Cautioned about overuse of beta agonist -Follow-up in 3 weeks to reassess -continue Hyzaar for hypertension.  Kristian CoveyBruce W Hank Walling MD [facility]

## 2017-01-08 ENCOUNTER — Telehealth: Payer: Self-pay | Admitting: Family Medicine

## 2017-01-08 NOTE — Telephone Encounter (Signed)
Pt need new Rx for Adderall XR  Pt is aware of 3 business days for refills

## 2017-01-08 NOTE — Telephone Encounter (Signed)
Last filled 12/13/16.  Last visit 12/25/16.  Okay to fill?

## 2017-01-09 NOTE — Telephone Encounter (Signed)
May refill for 3 months ?

## 2017-01-10 MED ORDER — AMPHETAMINE-DEXTROAMPHET ER 20 MG PO CP24: 20.0000 mg | ORAL_CAPSULE | ORAL | 0 refills | Status: DC

## 2017-01-10 MED ORDER — AMPHETAMINE-DEXTROAMPHET ER 20 MG PO CP24: 20.0000 mg | ORAL_CAPSULE | Freq: Every day | ORAL | 0 refills | Status: DC

## 2017-01-11 ENCOUNTER — Other Ambulatory Visit: Payer: Self-pay | Admitting: Family Medicine

## 2017-01-15 ENCOUNTER — Other Ambulatory Visit: Payer: Self-pay | Admitting: *Deleted

## 2017-01-15 MED ORDER — BECLOMETHASONE DIPROP HFA 80 MCG/ACT IN AERB: 80.0000 ug | INHALATION_SPRAY | Freq: Two times a day (BID) | RESPIRATORY_TRACT | 3 refills | Status: DC

## 2017-01-16 ENCOUNTER — Telehealth: Payer: Self-pay | Admitting: Family Medicine

## 2017-01-16 NOTE — Telephone Encounter (Signed)
Pt is not due until Monday 01-20-17. Pt would like to pick up generic adderall 10 mg on this friday

## 2017-01-17 NOTE — Telephone Encounter (Signed)
Refill Monday.

## 2017-01-17 NOTE — Telephone Encounter (Signed)
Last refill 12/23/16.  Last office visit 12/25/16.  Okay to fill?

## 2017-01-20 MED ORDER — AMPHETAMINE-DEXTROAMPHETAMINE 10 MG PO TABS: ORAL_TABLET | ORAL | 0 refills | Status: DC

## 2017-01-20 NOTE — Telephone Encounter (Signed)
Rx ready for pick up.  Attempted to call patient, but "voicemail box is full".

## 2017-03-07 ENCOUNTER — Telehealth: Payer: Self-pay | Admitting: Family Medicine

## 2017-03-07 NOTE — Telephone Encounter (Signed)
Pt following up on this request.  Pt states paper scripts have probably been thrown away.
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
## 2017-03-08 ENCOUNTER — Encounter (HOSPITAL_COMMUNITY): Payer: Self-pay

## 2017-03-08 ENCOUNTER — Emergency Department (HOSPITAL_COMMUNITY): Payer: BLUE CROSS/BLUE SHIELD

## 2017-03-08 ENCOUNTER — Emergency Department (HOSPITAL_COMMUNITY)
Admission: EM | Admit: 2017-03-08 | Discharge: 2017-03-08 | Disposition: A | Discharge status: Home / Self Care | Payer: BLUE CROSS/BLUE SHIELD | Attending: Emergency Medicine | Admitting: Emergency Medicine

## 2017-03-08 DIAGNOSIS — N946 Dysmenorrhea, unspecified: Secondary | ICD-10-CM | POA: Diagnosis not present

## 2017-03-08 DIAGNOSIS — I10 Essential (primary) hypertension: Secondary | ICD-10-CM | POA: Insufficient documentation

## 2017-03-08 DIAGNOSIS — J45909 Unspecified asthma, uncomplicated: Secondary | ICD-10-CM | POA: Diagnosis not present

## 2017-03-08 DIAGNOSIS — Z79899 Other long term (current) drug therapy: Secondary | ICD-10-CM | POA: Diagnosis not present

## 2017-03-08 DIAGNOSIS — R102 Pelvic and perineal pain: Secondary | ICD-10-CM

## 2017-03-08 LAB — COMPREHENSIVE METABOLIC PANEL
ALT: 15 U/L (ref 14–54)
AST: 21 U/L (ref 15–41)
Albumin: 3.7 g/dL (ref 3.5–5.0)
Alkaline Phosphatase: 66 U/L (ref 38–126)
Anion gap: 6 (ref 5–15)
BUN: 12 mg/dL (ref 6–20)
CO2: 26 mmol/L (ref 22–32)
Calcium: 9 mg/dL (ref 8.9–10.3)
Chloride: 103 mmol/L (ref 101–111)
Creatinine, Ser: 0.92 mg/dL (ref 0.44–1.00)
GFR calc Af Amer: >60 mL/min (ref >60)
GFR calc non Af Amer: >60 mL/min (ref >60)
Glucose, Bld: 113 mg/dL — ABNORMAL HIGH (ref 65–99)
Potassium: 3.7 mmol/L (ref 3.5–5.1)
Sodium: 135 mmol/L (ref 135–145)
Total Bilirubin: 0.4 mg/dL (ref 0.3–1.2)
Total Protein: 6.8 g/dL (ref 6.5–8.1)

## 2017-03-08 LAB — CBC WITH DIFFERENTIAL/PLATELET
Basophils Absolute: 0 10*3/uL (ref 0.0–0.1)
Basophils Relative: 0 %
Eosinophils Absolute: 0.2 10*3/uL (ref 0.0–0.7)
Eosinophils Relative: 2 %
HCT: 39.8 % (ref 36.0–46.0)
Hemoglobin: 12.8 g/dL (ref 12.0–15.0)
Lymphocytes Relative: 34 %
Lymphs Abs: 3.3 10*3/uL (ref 0.7–4.0)
MCH: 27.1 pg (ref 26.0–34.0)
MCHC: 32.2 g/dL (ref 30.0–36.0)
MCV: 84.1 fL (ref 78.0–100.0)
Monocytes Absolute: 0.6 10*3/uL (ref 0.1–1.0)
Monocytes Relative: 6 %
Neutro Abs: 5.6 10*3/uL (ref 1.7–7.7)
Neutrophils Relative %: 58 %
Platelets: 376 10*3/uL (ref 150–400)
RBC: 4.73 MIL/uL (ref 3.87–5.11)
RDW: 14.4 % (ref 11.5–15.5)
WBC: 9.7 10*3/uL (ref 4.0–10.5)

## 2017-03-08 LAB — URINALYSIS, ROUTINE W REFLEX MICROSCOPIC
Bilirubin Urine: NEGATIVE
Glucose, UA: NEGATIVE mg/dL
Ketones, ur: 5 mg/dL — AB
Leukocytes, UA: NEGATIVE
Nitrite: NEGATIVE
Protein, ur: 30 mg/dL — AB
Specific Gravity, Urine: 1.031 — ABNORMAL HIGH (ref 1.005–1.030)
pH: 5 (ref 5.0–8.0)

## 2017-03-08 LAB — WET PREP, GENITAL
Clue Cells Wet Prep HPF POC: NONE SEEN
Sperm: NONE SEEN
Trich, Wet Prep: NONE SEEN
Yeast Wet Prep HPF POC: NONE SEEN

## 2017-03-08 LAB — I-STAT BETA HCG BLOOD, ED (MC, WL, AP ONLY): I-stat hCG, quantitative: <5 m[IU]/mL (ref <5)

## 2017-03-08 MED ORDER — ALBUTEROL SULFATE HFA 108 (90 BASE) MCG/ACT IN AERS
2.0000 | INHALATION_SPRAY | Freq: Once | RESPIRATORY_TRACT | Status: AC
  Administered 2017-03-08: 2 via RESPIRATORY_TRACT
  Filled 2017-03-08: qty 6.7

## 2017-03-08 MED ORDER — FENTANYL CITRATE (PF) 100 MCG/2ML IJ SOLN
50.0000 ug | INTRAMUSCULAR | Status: DC | PRN
  Administered 2017-03-08: 50 ug via NASAL

## 2017-03-08 MED ORDER — FENTANYL CITRATE (PF) 100 MCG/2ML IJ SOLN
INTRAMUSCULAR | Status: AC
  Filled 2017-03-08: qty 2

## 2017-03-08 NOTE — ED Notes (Signed)
Patient transported to Ultrasound 

## 2017-03-08 NOTE — Discharge Instructions (Signed)
Follow up with gynecology.  If you were given medicines take as directed.  If you are on coumadin or contraceptives realize their levels and effectiveness is altered by many different medicines.  If you have any reaction (rash, tongues swelling, other) to the medicines stop taking and see a physician.    If your blood pressure was elevated in the ER make sure you follow up for management with a primary doctor or return for chest pain, shortness of breath or stroke symptoms.  Please follow up as directed and return to the ER or see a physician for new or worsening symptoms.  Thank you. Vitals:   03/08/17 1452 03/08/17 1749  BP: (!) 133/96 123/81  Pulse: 69 60  Resp: 20 18  Temp: 97.4 F (36.3 C)   TempSrc: Oral   SpO2: 96% 96%

## 2017-03-08 NOTE — ED Provider Notes (Signed)
[facility]ENCY DEPT Provider Note   CSN: [id number] Arrival date & time: 03/08/17  1449     History   Chief Complaint Chief Complaint  Patient presents with  . Pelvic Pain    HPI [name] is a 51 y.o. female.  The history is provided by the patient.  Abdominal Cramping  This is a new problem. The current episode started 1 to 2 hours ago. The problem occurs rarely. The problem has been gradually improving (after fentanyl was given in triage). Associated symptoms include abdominal pain (pelvic). Pertinent negatives include no chest pain and no shortness of breath. Associated symptoms comments: Vaginal bleed heavier than her period which started on wed. Nothing aggravates the symptoms. The symptoms are relieved by narcotics. She has tried nothing for the symptoms. The treatment provided mild relief.    Past Medical History:  Diagnosis Date  . Asthma   . Blood transfusion   . Depression   . GERD (gastroesophageal reflux disease)   . Hepatitis A   . Hypertension   . Jaundice   . Migraines   . Ovarian cyst   . UTI (urinary tract infection)     Patient Active Problem List   Diagnosis Date Noted  . ALLERGIC RHINITIS 01/29/2010  . Attention deficit disorder 12/14/2009  . Anxiety state 10/23/2009  . Adjustment disorder with mixed anxiety and depressed mood 05/25/2009  . Essential hypertension 05/25/2009  . Asthma 05/25/2009  . GERD 05/25/2009    Past Surgical History:  Procedure Laterality Date  . APPENDECTOMY  1994  . OPEN REDUCTION INTERNAL FIXATION (ORIF) METACARPAL Right 08/15/2016   Procedure: OPEN REDUCTION INTERNAL FIXATION (ORIF) right ring finger middle phalanx fracture;  Surgeon: Betha Loa, MD;  Location: [facility];  Service: Orthopedics;  Laterality: Right;  . OVARIAN CYST REMOVAL    . TONSILLECTOMY AND ADENOIDECTOMY      OB History    No data available       Home Medications    Prior to Admission medications   Medication  Sig Start Date End Date Taking? Authorizing Provider  albuterol (PROVENTIL) (2.5 MG/3ML) 0.083% nebulizer solution Take 3 mLs (2.5 mg total) by nebulization every 6 (six) hours as needed for wheezing or shortness of breath. 06/14/16   Burchette, Elberta Fortis, MD  albuterol (VENTOLIN HFA) 108 (90 Base) MCG/ACT inhaler Inhale 2 puffs into the lungs every 4 (four) hours as needed for wheezing or shortness of breath. 12/25/16   Burchette, Elberta Fortis, MD  amphetamine-dextroamphetamine (ADDERALL XR) 20 MG 24 hr capsule Take 1 capsule (20 mg total) by mouth daily. 01/10/17   Burchette, Elberta Fortis, MD  amphetamine-dextroamphetamine (ADDERALL XR) 20 MG 24 hr capsule Take 1 capsule (20 mg total) by mouth every morning. 01/10/17   Burchette, Elberta Fortis, MD  amphetamine-dextroamphetamine (ADDERALL XR) 20 MG 24 hr capsule Take 1 capsule (20 mg total) by mouth daily. Fill in two months 01/10/17   Burchette, Elberta Fortis, MD  amphetamine-dextroamphetamine (ADDERALL) 10 MG tablet Take One Tablet daily After Lunch Daily. 01/20/17   Burchette, Elberta Fortis, MD  amphetamine-dextroamphetamine (ADDERALL) 10 MG tablet Take one tablet daily after lunch - fill in one month 01/20/17   Burchette, Elberta Fortis, MD  amphetamine-dextroamphetamine (ADDERALL) 10 MG tablet Take one table daily after lunch - fill in two months 01/20/17   Burchette, Elberta Fortis, MD  Beclomethasone Diprop HFA (QVAR REDIHALER) 80 MCG/ACT AERB Inhale 80 mcg into the lungs 2 (two) times daily. 01/15/17   Burchette, Elberta Fortis,  MD  losartan-hydrochlorothiazide (HYZAAR) 100-12.5 MG tablet TAKE 1 TABLET BY MOUTH DAILY 12/03/16   Burchette, Elberta Fortis, MD  montelukast (SINGULAIR) 10 MG tablet Take 1 tablet (10 mg total) by mouth at bedtime. 05/10/16   Burchette, Elberta Fortis, MD  predniSONE (DELTASONE) 20 MG tablet Take 2 tablets once daily for 5 days 12/25/16   Kristian Covey, MD  sertraline (ZOLOFT) 100 MG tablet take 1 tablet by mouth once daily 10/04/16   Burchette, Elberta Fortis, MD  VENTOLIN HFA 108 (90 Base)  MCG/ACT inhaler INHALE 2-3 PUFFS EVERY 6 HOURS IF NEEDED FOR WHEEZING OR SHORTNESS OF BREATH 01/13/17   Burchette, Elberta Fortis, MD    Family History Family History  Problem Relation Age of Onset  . Asthma Son   . Hypertension Other   . Crohn's disease Father   . Crohn's disease Maternal Aunt   . Esophageal cancer Maternal Uncle   . Breast cancer Maternal Grandmother     Social History Social History  Substance Use Topics  . Smoking status: Never Smoker  . Smokeless tobacco: Never Used  . Alcohol use Yes     Comment: several times monthly     Allergies   Aspirin; Codeine sulfate; and Hydrocodone-acetaminophen   Review of Systems Review of Systems  Constitutional: Negative for fever.  HENT: Negative.   Respiratory: Negative for cough and shortness of breath.   Cardiovascular: Negative for chest pain.  Gastrointestinal: Positive for abdominal pain (pelvic). Negative for diarrhea and vomiting.  Genitourinary: Positive for pelvic pain and vaginal bleeding. Negative for dysuria and flank pain.  Musculoskeletal: Negative.   Skin: Negative.   Neurological: Negative.   All other systems reviewed and are negative.    Physical Exam Updated Vital Signs BP 129/72   Pulse (!) 57   Temp 97.4 F (36.3 C) (Oral)   Resp 18   SpO2 99%   Physical Exam  Constitutional: She is oriented to person, place, and time. She appears well-developed and well-nourished. No distress.  HENT:  Head: Normocephalic and atraumatic.  Mouth/Throat: Oropharynx is clear and moist.  Eyes: EOM are normal. Pupils are equal, round, and reactive to light.  Neck: Normal range of motion. Neck supple.  Cardiovascular: Normal rate, regular rhythm, normal heart sounds and intact distal pulses.   Pulmonary/Chest: Effort normal and breath sounds normal. No respiratory distress.  Abdominal: Soft. She exhibits no distension. There is tenderness (suprapubic). There is no guarding.  Genitourinary:  Genitourinary  Comments: Small amt of vaginal bleeding noted. No discharge. No cmt  Musculoskeletal: Normal range of motion. She exhibits no deformity.  Neurological: She is alert and oriented to person, place, and time. No cranial nerve deficit. She exhibits normal muscle tone. Coordination normal.  Skin: Skin is warm and dry. No rash noted. She is not diaphoretic. No erythema.  Psychiatric: She has a normal mood and affect.  Nursing note and vitals reviewed.    ED Treatments / Results  Labs (all labs ordered are listed, but only abnormal results are displayed) Labs Reviewed  WET PREP, GENITAL - Abnormal; Notable for the following:       Result Value   WBC, Wet Prep HPF POC MODERATE (*)    All other components within normal limits  COMPREHENSIVE METABOLIC PANEL - Abnormal; Notable for the following:    Glucose, Bld 113 (*)    All other components within normal limits  URINALYSIS, ROUTINE W REFLEX MICROSCOPIC - Abnormal; Notable for the following:    Color, Urine  AMBER (*)    APPearance TURBID (*)    Specific Gravity, Urine 1.031 (*)    Hgb urine dipstick LARGE (*)    Ketones, ur 5 (*)    Protein, ur 30 (*)    Bacteria, UA RARE (*)    Squamous Epithelial / LPF 6-30 (*)    All other components within normal limits  CBC WITH DIFFERENTIAL/PLATELET  I-STAT BETA HCG BLOOD, ED ([facility], [facility], [facility] ONLY)  GC/CHLAMYDIA PROBE AMP ([place]) NOT AT [facility]    EKG  EKG Interpretation None       Radiology US Transvaginal Non-ob  Result Date: 03/08/2017 CLINICAL DATA:  Bilateral pelvic pain EXAM: TRANSABDOMINAL AND TRANSVAGINAL ULTRASOUND OF PELVIS DOPPLER ULTRASOUND OF OVARIES TECHNIQUE: Both transabdominal and transvaginal ultrasound examinations of the pelvis were performed. Transabdominal technique was performed for global imaging of the pelvis including uterus, ovaries, adnexal regions, and pelvic cul-de-sac. It was necessary to proceed with endovaginal exam following the transabdominal exam to visualize  the ovaries. Color and duplex Doppler ultrasound was utilized to evaluate blood flow to the ovaries. COMPARISON:  None. FINDINGS: Uterus Measurements: 8.5 x 4.8 x 5.5 cm. No focal mass identified. Endometrium Thickness: 9 mm.  No focal abnormality visualized. Right ovary Measurements: 2.3 x 1.0 x 1.3 cm. Normal appearance/no adnexal mass. Left ovary Measurements: 2.2 x 1.3 x 1.6 cm. Normal appearance/no adnexal mass. Pulsed Doppler evaluation of both ovaries demonstrates normal low-resistance arterial and venous waveforms. Other findings A small amount of fluid in the pelvis is likely physiologic. Nabothian cysts are identified. IMPRESSION: 1. No cause for the patient's pain identified. Fluid in the pelvis is likely physiologic. Electronically Signed   By: Gerome Sam III M.D   On: 03/08/2017 17:47   US Pelvis Complete  Result Date: 03/08/2017 CLINICAL DATA:  Bilateral pelvic pain EXAM: TRANSABDOMINAL AND TRANSVAGINAL ULTRASOUND OF PELVIS DOPPLER ULTRASOUND OF OVARIES TECHNIQUE: Both transabdominal and transvaginal ultrasound examinations of the pelvis were performed. Transabdominal technique was performed for global imaging of the pelvis including uterus, ovaries, adnexal regions, and pelvic cul-de-sac. It was necessary to proceed with endovaginal exam following the transabdominal exam to visualize the ovaries. Color and duplex Doppler ultrasound was utilized to evaluate blood flow to the ovaries. COMPARISON:  None. FINDINGS: Uterus Measurements: 8.5 x 4.8 x 5.5 cm. No focal mass identified. Endometrium Thickness: 9 mm.  No focal abnormality visualized. Right ovary Measurements: 2.3 x 1.0 x 1.3 cm. Normal appearance/no adnexal mass. Left ovary Measurements: 2.2 x 1.3 x 1.6 cm. Normal appearance/no adnexal mass. Pulsed Doppler evaluation of both ovaries demonstrates normal low-resistance arterial and venous waveforms. Other findings A small amount of fluid in the pelvis is likely physiologic. Nabothian cysts  are identified. IMPRESSION: 1. No cause for the patient's pain identified. Fluid in the pelvis is likely physiologic. Electronically Signed   By: Gerome Sam III M.D   On: 03/08/2017 17:47   [place] Art/ven Flow Abd Pelv Doppler  Result Date: 03/08/2017 CLINICAL DATA:  Bilateral pelvic pain EXAM: TRANSABDOMINAL AND TRANSVAGINAL ULTRASOUND OF PELVIS DOPPLER ULTRASOUND OF OVARIES TECHNIQUE: Both transabdominal and transvaginal ultrasound examinations of the pelvis were performed. Transabdominal technique was performed for global imaging of the pelvis including uterus, ovaries, adnexal regions, and pelvic cul-de-sac. It was necessary to proceed with endovaginal exam following the transabdominal exam to visualize the ovaries. Color and duplex Doppler ultrasound was utilized to evaluate blood flow to the ovaries. COMPARISON:  None. FINDINGS: Uterus Measurements: 8.5 x 4.8 x 5.5 cm. No focal  mass identified. Endometrium Thickness: 9 mm.  No focal abnormality visualized. Right ovary Measurements: 2.3 x 1.0 x 1.3 cm. Normal appearance/no adnexal mass. Left ovary Measurements: 2.2 x 1.3 x 1.6 cm. Normal appearance/no adnexal mass. Pulsed Doppler evaluation of both ovaries demonstrates normal low-resistance arterial and venous waveforms. Other findings A small amount of fluid in the pelvis is likely physiologic. Nabothian cysts are identified. IMPRESSION: 1. No cause for the patient's pain identified. Fluid in the pelvis is likely physiologic. Electronically Signed   By: Gerome Samavid  Williams III M.D   On: 03/08/2017 17:47    Procedures Procedures (including critical care time)  Medications Ordered in ED Medications  albuterol (PROVENTIL HFA;VENTOLIN HFA) 108 (90 Base) MCG/ACT inhaler 2 puff (2 puffs Inhalation Given 03/08/17 1903)     Initial Impression / Assessment and Plan / ED Course  I have reviewed the triage vital signs and the nursing notes.  Pertinent labs & imaging results that were available during my care  of the patient were reviewed by me and considered in my medical decision making (see chart for details).     Patient is a 51 year old female who presents with sudden onset of suprapubic intermittent cramping and sharp pain. This started about 1-2 hours ago and was enough that she cannot walk. She started her period on Wednesday with abnormally heavy bleeding. Otherwise been in her usual state of health. History of appendectomy and where she states was a ruptured ovarian cyst many years ago. Further history and exam as above. Pain much improved while here. Pelvic exam as above but largely unremarkable. Ultrasound obtained without obvious ovarian pathology. Labs unremarkable. At this time I do not believe she has a emergent surgical pathology or infection pathology. preg test negative.  I have reviewed all labs and imaging. Patient stable for discharge home.  I have reviewed all results with the patient. Advised to f/u with pcp within 5 days. Advised otc symptomatic management . Patient agrees to stated plan. All questions answered. Advised to call or return to have any questions, new symptoms, change in symptoms, or symptoms that they do not understand.   Final Clinical Impressions(s) / ED Diagnoses   Final diagnoses:  Pelvic pain  Dysmenorrhea    New Prescriptions Discharge Medication List as of 03/08/2017  6:22 PM       Marijean NiemannWoodrum, Breeonna Mone, MD 03/09/17 [id number]    Blane OharaZavitz, Joshua, MD 03/10/17 0010

## 2017-03-08 NOTE — ED Notes (Signed)
Pt remains out of room for testing 

## 2017-03-09 NOTE — Telephone Encounter (Signed)
Refill but will be unable to refill early if occurs repeatedly/again.

## 2017-03-10 LAB — GC/CHLAMYDIA PROBE AMP (~~LOC~~) NOT AT ARMC
Chlamydia: NEGATIVE
Neisseria Gonorrhea: NEGATIVE

## 2017-03-10 MED ORDER — AMPHETAMINE-DEXTROAMPHET ER 20 MG PO CP24: 20.0000 mg | ORAL_CAPSULE | ORAL | 0 refills | Status: DC

## 2017-03-10 MED ORDER — AMPHETAMINE-DEXTROAMPHET ER 20 MG PO CP24: 20.0000 mg | ORAL_CAPSULE | Freq: Every day | ORAL | 0 refills | Status: DC

## 2017-03-10 MED ORDER — AMPHETAMINE-DEXTROAMPHETAMINE 10 MG PO TABS: ORAL_TABLET | ORAL | 0 refills | Status: DC

## 2017-03-10 NOTE — Telephone Encounter (Signed)
Patient is aware that no more refills will be given for lost prescriptions.  rx ready for pick up.

## 2017-03-28 ENCOUNTER — Other Ambulatory Visit: Payer: Self-pay | Admitting: Family Medicine

## 2017-04-05 ENCOUNTER — Other Ambulatory Visit: Payer: Self-pay | Admitting: Family Medicine

## 2017-04-16 ENCOUNTER — Emergency Department (HOSPITAL_COMMUNITY)
Admission: EM | Admit: 2017-04-16 | Discharge: 2017-04-16 | Disposition: A | Discharge status: Home / Self Care | Payer: BLUE CROSS/BLUE SHIELD | Attending: Emergency Medicine | Admitting: Emergency Medicine

## 2017-04-16 ENCOUNTER — Encounter (HOSPITAL_COMMUNITY): Payer: Self-pay

## 2017-04-16 ENCOUNTER — Emergency Department (HOSPITAL_COMMUNITY): Payer: BLUE CROSS/BLUE SHIELD

## 2017-04-16 DIAGNOSIS — I10 Essential (primary) hypertension: Secondary | ICD-10-CM | POA: Diagnosis not present

## 2017-04-16 DIAGNOSIS — J45901 Unspecified asthma with (acute) exacerbation: Secondary | ICD-10-CM | POA: Diagnosis not present

## 2017-04-16 DIAGNOSIS — Z79899 Other long term (current) drug therapy: Secondary | ICD-10-CM | POA: Insufficient documentation

## 2017-04-16 DIAGNOSIS — R0602 Shortness of breath: Secondary | ICD-10-CM | POA: Diagnosis present

## 2017-04-16 LAB — CBC WITH DIFFERENTIAL/PLATELET
Basophils Absolute: 0 10*3/uL (ref 0.0–0.1)
Basophils Relative: 0 %
Eosinophils Absolute: 0.4 10*3/uL (ref 0.0–0.7)
Eosinophils Relative: 3 %
HCT: 36.6 % (ref 36.0–46.0)
Hemoglobin: 11.9 g/dL — ABNORMAL LOW (ref 12.0–15.0)
Lymphocytes Relative: 13 %
Lymphs Abs: 1.3 10*3/uL (ref 0.7–4.0)
MCH: 27.2 pg (ref 26.0–34.0)
MCHC: 32.5 g/dL (ref 30.0–36.0)
MCV: 83.6 fL (ref 78.0–100.0)
Monocytes Absolute: 0.7 10*3/uL (ref 0.1–1.0)
Monocytes Relative: 6 %
Neutro Abs: 8.3 10*3/uL — ABNORMAL HIGH (ref 1.7–7.7)
Neutrophils Relative %: 78 %
Platelets: 323 10*3/uL (ref 150–400)
RBC: 4.38 MIL/uL (ref 3.87–5.11)
RDW: 15 % (ref 11.5–15.5)
WBC: 10.8 10*3/uL — ABNORMAL HIGH (ref 4.0–10.5)

## 2017-04-16 LAB — BASIC METABOLIC PANEL
Anion gap: 10 (ref 5–15)
BUN: 14 mg/dL (ref 6–20)
CO2: 24 mmol/L (ref 22–32)
Calcium: 8.8 mg/dL — ABNORMAL LOW (ref 8.9–10.3)
Chloride: 106 mmol/L (ref 101–111)
Creatinine, Ser: 0.82 mg/dL (ref 0.44–1.00)
GFR calc Af Amer: >60 mL/min (ref >60)
GFR calc non Af Amer: >60 mL/min (ref >60)
Glucose, Bld: 139 mg/dL — ABNORMAL HIGH (ref 65–99)
Potassium: 3.3 mmol/L — ABNORMAL LOW (ref 3.5–5.1)
Sodium: 140 mmol/L (ref 135–145)

## 2017-04-16 LAB — I-STAT TROPONIN, ED: Troponin i, poc: 0 ng/mL (ref 0.00–0.08)

## 2017-04-16 MED ORDER — PREDNISONE 20 MG PO TABS: 40.0000 mg | ORAL_TABLET | Freq: Every day | ORAL | 0 refills | Status: DC

## 2017-04-16 MED ORDER — ALBUTEROL SULFATE (2.5 MG/3ML) 0.083% IN NEBU: 5.0000 mg | INHALATION_SOLUTION | Freq: Once | RESPIRATORY_TRACT | Status: DC

## 2017-04-16 MED ORDER — ALBUTEROL (5 MG/ML) CONTINUOUS INHALATION SOLN
10.0000 mg/h | INHALATION_SOLUTION | Freq: Once | RESPIRATORY_TRACT | Status: AC
Start: 2017-04-16 — End: 2017-04-16
  Administered 2017-04-16: 10 mg/h via RESPIRATORY_TRACT
  Filled 2017-04-16 (×2): qty 20

## 2017-04-16 NOTE — ED Notes (Signed)
Found a black charger in room. Called and left patient voicemail that it's here in ED.

## 2017-04-16 NOTE — Discharge Instructions (Signed)
Use albuterol inhaler every 4 hours. Take prednisone as prescribed until all gone. Please follow-up with family doctor if not improving. Return if worsening symptoms.

## 2017-04-16 NOTE — ED Notes (Signed)
Pt is alert and oriented x 4 and is verbal responsive. Pt spouse is at bedside. Pt has some labored breathing noted.Pt has wheezez noted in bilateral lung fields.

## 2017-04-16 NOTE — ED Triage Notes (Signed)
Pt brought in by EMS, from home. Pt c/o SOB that start tonight. Pt has been congested x the past 3 days. Pt has Hx of Asthma and used her rescue inhaler with no relied. Per EMS pt has expiratory wheezes bilat lung fields. Enroute pt was given 10mg  Albuterol, 1mg  of Atrovent, 125mg  of soul medrol.    BP 150/92 HR 108 RR 24  20G L AC

## 2017-04-16 NOTE — ED Notes (Signed)
Respiratory at bedside.

## 2017-04-16 NOTE — ED Notes (Signed)
Patient ambulated around department. Oxygen saturation stayed between 97-100% on room air.  Patient states that she is little "out of breath, but I am ok".

## 2017-04-16 NOTE — ED Notes (Signed)
Patient transported to X-ray 

## 2017-04-16 NOTE — ED Provider Notes (Signed)
[name] DEPT Provider Note   CSN: [id number] Arrival date & time: 04/16/17  0547     History   Chief Complaint Chief Complaint  Patient presents with  . Shortness of Breath    HPI [name] is a 51 y.o. female.  HPI [name] is a 51 y.o. female with hx of asthma, anemia, HTN, presents to ED with complaint of shortness of breath. Pt states she developed URI symptoms 1 days ago. Last night, she suddenly developed increased shortness of breath, cough, wheezing. This morning her sob has gotten much worse, and she was unable to even walk to the car so called EMS. She reports using her inhaler with no relief. Denies fever. No chest pain. Cough is non productive. States URI symptoms are better today. Reports upon arival by EMS oxygen sat was 78%. Pt states nothing making her symptoms better or worse. No chest pain.   Past Medical History:  Diagnosis Date  . Asthma   . Blood transfusion   . Depression   . GERD (gastroesophageal reflux disease)   . Hepatitis A   . Hypertension   . Jaundice   . Migraines   . Ovarian cyst   . UTI (urinary tract infection)     Patient Active Problem List   Diagnosis Date Noted  . ALLERGIC RHINITIS 01/29/2010  . Attention deficit disorder 12/14/2009  . Anxiety state 10/23/2009  . Adjustment disorder with mixed anxiety and depressed mood 05/25/2009  . Essential hypertension 05/25/2009  . Asthma 05/25/2009  . GERD 05/25/2009    Past Surgical History:  Procedure Laterality Date  . APPENDECTOMY  1994  . OPEN REDUCTION INTERNAL FIXATION (ORIF) METACARPAL Right 08/15/2016   Procedure: OPEN REDUCTION INTERNAL FIXATION (ORIF) right ring finger middle phalanx fracture;  Surgeon: Betha Loa, MD;  Location: [facility];  Service: Orthopedics;  Laterality: Right;  . OVARIAN CYST REMOVAL    . TONSILLECTOMY AND ADENOIDECTOMY      OB History    No data available       Home Medications    Prior to Admission  medications   Medication Sig Start Date End Date Taking? Authorizing Provider  albuterol (PROVENTIL) (2.5 MG/3ML) 0.083% nebulizer solution Take 3 mLs (2.5 mg total) by nebulization every 6 (six) hours as needed for wheezing or shortness of breath. 06/14/16   Burchette, Elberta Fortis, MD  albuterol (VENTOLIN HFA) 108 (90 Base) MCG/ACT inhaler Inhale 2 puffs into the lungs every 4 (four) hours as needed for wheezing or shortness of breath. 12/25/16   Burchette, Elberta Fortis, MD  amphetamine-dextroamphetamine (ADDERALL XR) 20 MG 24 hr capsule Take 1 capsule (20 mg total) by mouth daily. 03/10/17   Burchette, Elberta Fortis, MD  amphetamine-dextroamphetamine (ADDERALL XR) 20 MG 24 hr capsule Take 1 capsule (20 mg total) by mouth every morning. 03/10/17   Burchette, Elberta Fortis, MD  amphetamine-dextroamphetamine (ADDERALL XR) 20 MG 24 hr capsule Take 1 capsule (20 mg total) by mouth daily. Fill in two months 03/10/17   Burchette, Elberta Fortis, MD  amphetamine-dextroamphetamine (ADDERALL) 10 MG tablet Take One Tablet daily After Lunch Daily. 03/10/17   Burchette, Elberta Fortis, MD  amphetamine-dextroamphetamine (ADDERALL) 10 MG tablet Take one tablet daily after lunch - fill in one month 03/10/17   Burchette, Elberta Fortis, MD  amphetamine-dextroamphetamine (ADDERALL) 10 MG tablet Take one table daily after lunch - fill in two months 03/10/17   Burchette, Elberta Fortis, MD  Beclomethasone Diprop HFA (QVAR REDIHALER) 80 MCG/ACT AERB  Inhale 80 mcg into the lungs 2 (two) times daily. 01/15/17   Burchette, Elberta Fortis, MD  losartan-hydrochlorothiazide (HYZAAR) 100-12.5 MG tablet TAKE 1 TABLET BY MOUTH DAILY 12/03/16   Burchette, Elberta Fortis, MD  montelukast (SINGULAIR) 10 MG tablet Take 1 tablet (10 mg total) by mouth at bedtime. 05/10/16   Burchette, Elberta Fortis, MD  predniSONE (DELTASONE) 20 MG tablet Take 2 tablets once daily for 5 days 12/25/16   Kristian Covey, MD  sertraline (ZOLOFT) 100 MG tablet take 1 tablet by mouth once daily 10/04/16   Burchette, Elberta Fortis, MD  VENTOLIN  HFA 108 (90 Base) MCG/ACT inhaler INHALE 2-3 PUFFS EVERY 6 HOURS IF NEEDED FOR WHEEZING OR SHORTNESS OF BREATH 01/13/17   Burchette, Elberta Fortis, MD  VENTOLIN HFA 108 (90 Base) MCG/ACT inhaler INHALE 2-3 PUFFS EVERY 6 HOURS IF NEEDED FOR WHEEZING OR SHORTNESS OF BREATH 04/02/17   Burchette, Elberta Fortis, MD  VENTOLIN HFA 108 (90 Base) MCG/ACT inhaler INHALE 2 TO 3 PUFFS INTO THE LUNGS BY MOUTH EVERY 6 HOURS AS NEEDED FOR WHEEZING OR SHORTNESS OF BREATH 04/07/17   Burchette, Elberta Fortis, MD    Family History Family History  Problem Relation Age of Onset  . Asthma Son   . Hypertension Other   . Crohn's disease Father   . Crohn's disease Maternal Aunt   . Esophageal cancer Maternal Uncle   . Breast cancer Maternal Grandmother     Social History Social History  Substance Use Topics  . Smoking status: Never Smoker  . Smokeless tobacco: Never Used  . Alcohol use Yes     Comment: several times monthly     Allergies   Aspirin; Codeine sulfate; and Hydrocodone-acetaminophen   Review of Systems Review of Systems  Constitutional: Negative for chills and fever.  Respiratory: Positive for cough and shortness of breath. Negative for chest tightness.   Cardiovascular: Negative for chest pain, palpitations and leg swelling.  Gastrointestinal: Negative for abdominal pain, diarrhea, nausea and vomiting.  Genitourinary: Negative for dysuria, flank pain and pelvic pain.  Musculoskeletal: Negative for arthralgias, myalgias, neck pain and neck stiffness.  Skin: Negative for rash.  Neurological: Negative for dizziness, weakness and headaches.  All other systems reviewed and are negative.    Physical Exam Updated Vital Signs BP (!) 152/97 (BP Location: Left Arm)   Pulse 98   Temp 100 F (37.8 C) (Oral)   Ht 5\' 6"  (1.676 m)   Wt 77.1 kg (170 lb)   SpO2 98%   BMI 27.44 kg/m   Physical Exam  Constitutional: She appears well-developed and well-nourished. No distress.  HENT:  Head: Normocephalic.    Eyes: Conjunctivae are normal.  Neck: Neck supple.  Cardiovascular: Normal rate, regular rhythm and normal heart sounds.   Pulmonary/Chest: Effort normal. She has wheezes. She has no rales.  Inspiratory and expiratory wheezes bilaterally  Abdominal: Soft. Bowel sounds are normal. She exhibits no distension. There is no tenderness. There is no rebound.  Musculoskeletal: She exhibits no edema.  Neurological: She is alert.  Skin: Skin is warm and dry.  Psychiatric: She has a normal mood and affect. Her behavior is normal.  Nursing note and vitals reviewed.    ED Treatments / Results  Labs (all labs ordered are listed, but only abnormal results are displayed) Labs Reviewed  CBC WITH DIFFERENTIAL/PLATELET - Abnormal; Notable for the following:       Result Value   WBC 10.8 (*)    Hemoglobin 11.9 (*)  Neutro Abs 8.3 (*)    All other components within normal limits  BASIC METABOLIC PANEL - Abnormal; Notable for the following:    Potassium 3.3 (*)    Glucose, Bld 139 (*)    Calcium 8.8 (*)    All other components within normal limits  I-STAT TROPOININ, ED    EKG  EKG Interpretation [facility]       Radiology No results found.  Procedures Procedures (including critical care time)  Medications Ordered in ED Medications  albuterol (PROVENTIL,VENTOLIN) solution continuous neb (not administered)     Initial Impression / Assessment and Plan / ED Course  I have reviewed the triage vital signs and the nursing notes.  Pertinent labs & imaging results that were available during my care of the patient were reviewed by me and considered in my medical decision making (see chart for details).    Patient in emergency department with shortness of breath, on the exam inspiratory and expiratory wheezes bilaterally. Mild accessory muscle use. She is still able to speak full sentences. She already received total of 10 mg of albuterol by EMS and Solu-Medrol. We will do a continuous neb  treatment, x-ray, basic labs, EKG.  8:59 AM Labs and EKG with no significant abnormalities. Chest x-ray is negative. Patient feels much better after her continuous treatment. She was ambulated in the emergency department with pulse ox maintaining oxygen above 96% on room air. She still has mild end expiratory wheezes bilaterally, but exam is much improved. We'll discharge home with prednisone, inhaler, follow up in 2 days with her doctor.  Vitals:   04/16/17 0557 04/16/17 0614 04/16/17 0820  BP: (!) 152/97  (!) 161/82  Pulse: 98  98  Resp:   18  Temp: 100 F (37.8 C) 100 F (37.8 C)   TempSrc:  Oral   SpO2: 98% 98% 100%  Weight:  77.1 kg (170 lb)   Height:  5\' 6"  (1.676 m)      Final Clinical Impressions(s) / ED Diagnoses   Final diagnoses:  Exacerbation of asthma, unspecified asthma severity, unspecified whether persistent    New Prescriptions New Prescriptions   PREDNISONE (DELTASONE) 20 MG TABLET    Take 2 tablets (40 mg total) by mouth daily.     Jaynie CrumbleKirichenko, Coltan Spinello, PA-C 04/16/17 1528    Charlynne PanderYao, David Hsienta, MD 04/17/17 2017

## 2017-05-27 ENCOUNTER — Telehealth: Payer: Self-pay | Admitting: Family Medicine

## 2017-05-27 NOTE — Telephone Encounter (Signed)
Not due to 06/10/17

## 2017-05-27 NOTE — Telephone Encounter (Signed)
Pt needs new rx generic adderall xr 20 mg °

## 2017-06-03 MED ORDER — AMPHETAMINE-DEXTROAMPHET ER 20 MG PO CP24: 20.0000 mg | ORAL_CAPSULE | ORAL | 0 refills | Status: DC

## 2017-06-03 MED ORDER — AMPHETAMINE-DEXTROAMPHET ER 20 MG PO CP24: 20.0000 mg | ORAL_CAPSULE | Freq: Every day | ORAL | 0 refills | Status: DC

## 2017-06-03 NOTE — Telephone Encounter (Signed)
Pt calling checking to see if she can get the Rx before 06/10/17 due to her going out of town and will not be in town the whole week.  Pt is aware that it will not be printed until that day or afterward due to the state regulations pt verbalize understanding and said that she will just be without the medication for a week.

## 2017-06-03 NOTE — Telephone Encounter (Signed)
Okay per Dr Caryl NeverBurchette and patient is aware

## 2017-06-08 ENCOUNTER — Other Ambulatory Visit: Payer: Self-pay | Admitting: Family Medicine

## 2017-06-24 ENCOUNTER — Telehealth: Payer: Self-pay | Admitting: Family Medicine

## 2017-06-24 NOTE — Telephone Encounter (Signed)
Refills OK. 

## 2017-06-24 NOTE — Telephone Encounter (Signed)
° ° ° °  Pt request refill of the following: ° °amphetamine-dextroamphetamine (ADDERALL) 10 MG tablet ° ° °Phamacy: °

## 2017-06-24 NOTE — Telephone Encounter (Signed)
Last refill 03/10/17 and last office visit 12/25/16.  Okay to fill?

## 2017-06-25 MED ORDER — AMPHETAMINE-DEXTROAMPHETAMINE 10 MG PO TABS: ORAL_TABLET | ORAL | 0 refills | Status: DC

## 2017-06-25 NOTE — Telephone Encounter (Signed)
Pt mother Nelva Bush provided ID and picked up Adderall script and signed log.

## 2017-06-25 NOTE — Telephone Encounter (Signed)
Rx ready for pick up and patient is aware 

## 2017-07-24 ENCOUNTER — Encounter: Payer: Self-pay | Admitting: Family Medicine

## 2017-08-02 ENCOUNTER — Other Ambulatory Visit: Payer: Self-pay | Admitting: Family Medicine

## 2017-08-04 ENCOUNTER — Emergency Department (HOSPITAL_COMMUNITY): Payer: BLUE CROSS/BLUE SHIELD

## 2017-08-04 ENCOUNTER — Observation Stay (HOSPITAL_COMMUNITY)
Admission: EM | Admit: 2017-08-04 | Discharge: 2017-08-04 | Disposition: A | Discharge status: Home / Self Care | Payer: BLUE CROSS/BLUE SHIELD | Attending: Family Medicine | Admitting: Family Medicine

## 2017-08-04 ENCOUNTER — Encounter (HOSPITAL_COMMUNITY): Payer: Self-pay | Admitting: *Deleted

## 2017-08-04 DIAGNOSIS — Z8619 Personal history of other infectious and parasitic diseases: Secondary | ICD-10-CM | POA: Insufficient documentation

## 2017-08-04 DIAGNOSIS — F411 Generalized anxiety disorder: Secondary | ICD-10-CM | POA: Diagnosis not present

## 2017-08-04 DIAGNOSIS — Z885 Allergy status to narcotic agent status: Secondary | ICD-10-CM | POA: Insufficient documentation

## 2017-08-04 DIAGNOSIS — I10 Essential (primary) hypertension: Secondary | ICD-10-CM | POA: Diagnosis not present

## 2017-08-04 DIAGNOSIS — F4323 Adjustment disorder with mixed anxiety and depressed mood: Secondary | ICD-10-CM | POA: Diagnosis not present

## 2017-08-04 DIAGNOSIS — K219 Gastro-esophageal reflux disease without esophagitis: Secondary | ICD-10-CM | POA: Diagnosis present

## 2017-08-04 DIAGNOSIS — Z886 Allergy status to analgesic agent status: Secondary | ICD-10-CM | POA: Diagnosis not present

## 2017-08-04 DIAGNOSIS — J45901 Unspecified asthma with (acute) exacerbation: Secondary | ICD-10-CM | POA: Diagnosis present

## 2017-08-04 DIAGNOSIS — Z79899 Other long term (current) drug therapy: Secondary | ICD-10-CM | POA: Insufficient documentation

## 2017-08-04 DIAGNOSIS — J4541 Moderate persistent asthma with (acute) exacerbation: Principal | ICD-10-CM | POA: Insufficient documentation

## 2017-08-04 DIAGNOSIS — R0603 Acute respiratory distress: Secondary | ICD-10-CM | POA: Insufficient documentation

## 2017-08-04 DIAGNOSIS — F988 Other specified behavioral and emotional disorders with onset usually occurring in childhood and adolescence: Secondary | ICD-10-CM | POA: Diagnosis not present

## 2017-08-04 LAB — CBC WITH DIFFERENTIAL/PLATELET
Basophils Absolute: 0 10*3/uL (ref 0.0–0.1)
Basophils Relative: 0 %
Eosinophils Absolute: 0.4 10*3/uL (ref 0.0–0.7)
Eosinophils Relative: 4 %
HCT: 39.3 % (ref 36.0–46.0)
Hemoglobin: 13.1 g/dL (ref 12.0–15.0)
Lymphocytes Relative: 44 %
Lymphs Abs: 4.8 10*3/uL — ABNORMAL HIGH (ref 0.7–4.0)
MCH: 27.7 pg (ref 26.0–34.0)
MCHC: 33.3 g/dL (ref 30.0–36.0)
MCV: 83.1 fL (ref 78.0–100.0)
Monocytes Absolute: 0.7 10*3/uL (ref 0.1–1.0)
Monocytes Relative: 6 %
Neutro Abs: 5 10*3/uL (ref 1.7–7.7)
Neutrophils Relative %: 46 %
Platelets: 319 10*3/uL (ref 150–400)
RBC: 4.73 MIL/uL (ref 3.87–5.11)
RDW: 14.5 % (ref 11.5–15.5)
WBC: 10.9 10*3/uL — ABNORMAL HIGH (ref 4.0–10.5)

## 2017-08-04 LAB — BASIC METABOLIC PANEL
Anion gap: 13 (ref 5–15)
BUN: 12 mg/dL (ref 6–20)
CO2: 21 mmol/L — ABNORMAL LOW (ref 22–32)
Calcium: 9.3 mg/dL (ref 8.9–10.3)
Chloride: 105 mmol/L (ref 101–111)
Creatinine, Ser: 0.94 mg/dL (ref 0.44–1.00)
GFR calc Af Amer: >60 mL/min (ref >60)
GFR calc non Af Amer: >60 mL/min (ref >60)
Glucose, Bld: 107 mg/dL — ABNORMAL HIGH (ref 65–99)
Potassium: 3.5 mmol/L (ref 3.5–5.1)
Sodium: 139 mmol/L (ref 135–145)

## 2017-08-04 LAB — I-STAT TROPONIN, ED: Troponin i, poc: 0 ng/mL (ref 0.00–0.08)

## 2017-08-04 MED ORDER — PREDNISONE 20 MG PO TABS: 40.0000 mg | ORAL_TABLET | Freq: Every day | ORAL | 1 refills | Status: AC

## 2017-08-04 MED ORDER — ENOXAPARIN SODIUM 40 MG/0.4ML ~~LOC~~ SOLN: 40.0000 mg | SUBCUTANEOUS | Status: DC

## 2017-08-04 MED ORDER — IPRATROPIUM-ALBUTEROL 0.5-2.5 (3) MG/3ML IN SOLN: 3.0000 mL | Freq: Once | RESPIRATORY_TRACT | Status: DC

## 2017-08-04 MED ORDER — IPRATROPIUM-ALBUTEROL 0.5-2.5 (3) MG/3ML IN SOLN
3.0000 mL | RESPIRATORY_TRACT | Status: DC
  Administered 2017-08-04: 3 mL via RESPIRATORY_TRACT
  Filled 2017-08-04: qty 3

## 2017-08-04 MED ORDER — CLONIDINE HCL 0.2 MG PO TABS: 0.1000 mg | ORAL_TABLET | Freq: Every day | ORAL | Status: DC

## 2017-08-04 MED ORDER — DEXAMETHASONE SODIUM PHOSPHATE 10 MG/ML IJ SOLN
10.0000 mg | Freq: Once | INTRAMUSCULAR | Status: AC
  Administered 2017-08-04: 10 mg via INTRAVENOUS
  Filled 2017-08-04: qty 1

## 2017-08-04 MED ORDER — ALBUTEROL (5 MG/ML) CONTINUOUS INHALATION SOLN
10.0000 mg/h | INHALATION_SOLUTION | Freq: Once | RESPIRATORY_TRACT | Status: AC
  Administered 2017-08-04: 10 mg/h via RESPIRATORY_TRACT
  Filled 2017-08-04: qty 20

## 2017-08-04 MED ORDER — BISACODYL 10 MG RE SUPP: 10.0000 mg | Freq: Every day | RECTAL | Status: DC | PRN

## 2017-08-04 MED ORDER — SENNOSIDES-DOCUSATE SODIUM 8.6-50 MG PO TABS: 1.0000 | ORAL_TABLET | Freq: Every evening | ORAL | Status: DC | PRN

## 2017-08-04 MED ORDER — AMPHETAMINE-DEXTROAMPHET ER 25 MG PO CP24: 25.0000 mg | ORAL_CAPSULE | Freq: Every day | ORAL | Status: DC

## 2017-08-04 MED ORDER — MAGNESIUM SULFATE 2 GM/50ML IV SOLN
2.0000 g | Freq: Once | INTRAVENOUS | Status: AC
Start: 2017-08-04 — End: 2017-08-04
  Administered 2017-08-04: 2 g via INTRAVENOUS
  Filled 2017-08-04: qty 50

## 2017-08-04 MED ORDER — ONDANSETRON HCL 4 MG PO TABS: 4.0000 mg | ORAL_TABLET | Freq: Four times a day (QID) | ORAL | Status: DC | PRN

## 2017-08-04 MED ORDER — MONTELUKAST SODIUM 10 MG PO TABS: 10.0000 mg | ORAL_TABLET | Freq: Every day | ORAL | Status: DC

## 2017-08-04 MED ORDER — ONDANSETRON HCL 4 MG/2ML IJ SOLN: 4.0000 mg | Freq: Four times a day (QID) | INTRAMUSCULAR | Status: DC | PRN

## 2017-08-04 MED ORDER — ALBUTEROL SULFATE (2.5 MG/3ML) 0.083% IN NEBU: 2.5000 mg | INHALATION_SOLUTION | RESPIRATORY_TRACT | Status: DC | PRN

## 2017-08-04 MED ORDER — LOSARTAN POTASSIUM-HCTZ 100-12.5 MG PO TABS: 1.0000 | ORAL_TABLET | Freq: Every day | ORAL | Status: DC

## 2017-08-04 MED ORDER — PREDNISONE 10 MG PO TABS: 40.0000 mg | ORAL_TABLET | Freq: Every day | ORAL | 1 refills | Status: AC

## 2017-08-04 MED ORDER — SERTRALINE HCL 100 MG PO TABS: 100.0000 mg | ORAL_TABLET | Freq: Every day | ORAL | Status: DC

## 2017-08-04 MED ORDER — METHYLPREDNISOLONE SODIUM SUCC 125 MG IJ SOLR
60.0000 mg | Freq: Three times a day (TID) | INTRAMUSCULAR | Status: DC
Start: 2017-08-04 — End: 2017-08-04

## 2017-08-04 MED ORDER — PANTOPRAZOLE SODIUM 40 MG PO TBEC: 40.0000 mg | DELAYED_RELEASE_TABLET | Freq: Every day | ORAL | Status: DC

## 2017-08-04 MED ORDER — IPRATROPIUM BROMIDE 0.02 % IN SOLN
0.5000 mg | Freq: Once | RESPIRATORY_TRACT | Status: AC
  Administered 2017-08-04: 0.5 mg via RESPIRATORY_TRACT
  Filled 2017-08-04: qty 2.5

## 2017-08-04 MED ORDER — METHYLPREDNISOLONE SODIUM SUCC 125 MG IJ SOLR: 60.0000 mg | Freq: Three times a day (TID) | INTRAMUSCULAR | Status: DC

## 2017-08-04 NOTE — ED Provider Notes (Signed)
[facility]-EMER[facility]ENCY DEPT Provider Note   CSN: [id number] Arrival date & time: 08/04/17  0810     History   Chief Complaint Chief Complaint  Patient presents with  . Respiratory Distress    HPI [name] is a 51 y.o. female with history of hypertension and asthma presents to the ED via EMS for evaluation of "asthma attack" that started last night. States symptoms today are similar to previous asthma attacks but this time she feels more anxious and wheezing seems more "stubborn". Patient endorses chest tightness, wheezing, shortness of breath, cough She denies chest pain, fevers, chills, recent travel, unexpected weight loss, recent URI. Usually uses Qvair BID and Ventolin for asthma during the day at baseline, used this last night which did not help. Received solumedrol, albuterol, atrovent en route which have provide mild relief. Denies history of COPD, tobacco use Has required admission for asthma flares in the past. Has never needed intubation for asthma complications but came close to intubation many years ago.  HPI  Past Medical History:  Diagnosis Date  . Asthma   . Blood transfusion   . Depression   . GERD (gastroesophageal reflux disease)   . Hepatitis A   . Hypertension   . Jaundice   . Migraines   . Ovarian cyst   . UTI (urinary tract infection)     Patient Active Problem List   Diagnosis Date Noted  . ALLERGIC RHINITIS 01/29/2010  . Attention deficit disorder 12/14/2009  . Anxiety state 10/23/2009  . Adjustment disorder with mixed anxiety and depressed mood 05/25/2009  . Essential hypertension 05/25/2009  . Asthma 05/25/2009  . GERD 05/25/2009    Past Surgical History:  Procedure Laterality Date  . APPENDECTOMY  1994  . OPEN REDUCTION INTERNAL FIXATION (ORIF) METACARPAL Right 08/15/2016   Procedure: OPEN REDUCTION INTERNAL FIXATION (ORIF) right ring finger middle phalanx fracture;  Surgeon: Betha Loa, MD;  Location: [facility];   Service: Orthopedics;  Laterality: Right;  . OVARIAN CYST REMOVAL    . TONSILLECTOMY AND ADENOIDECTOMY      OB History    No data available       Home Medications    Prior to Admission medications   Medication Sig Start Date End Date Taking? Authorizing Provider  albuterol (PROVENTIL) (2.5 MG/3ML) 0.083% nebulizer solution Take 3 mLs (2.5 mg total) by nebulization every 6 (six) hours as needed for wheezing or shortness of breath. 06/14/16  Yes Burchette, Elberta Fortis, MD  albuterol (VENTOLIN HFA) 108 (90 Base) MCG/ACT inhaler Inhale 2 puffs into the lungs every 4 (four) hours as needed for wheezing or shortness of breath. 12/25/16  Yes Burchette, Elberta Fortis, MD  amphetamine-dextroamphetamine (ADDERALL XR) 25 MG 24 hr capsule Take 25 mg by mouth daily. 07/24/17  Yes [provider]  Beclomethasone Diprop HFA (QVAR REDIHALER) 80 MCG/ACT AERB Inhale 80 mcg into the lungs 2 (two) times daily. 01/15/17  Yes Burchette, Elberta Fortis, MD  cloNIDine (CATAPRES) 0.1 MG tablet Take 0.1 mg by mouth at bedtime. 06/09/17  Yes [provider]  esomeprazole (NEXIUM) 40 MG capsule Take 40 mg by mouth daily at 12 noon.   Yes [provider]  losartan-hydrochlorothiazide (HYZAAR) 100-12.5 MG tablet TAKE 1 TABLET BY MOUTH DAILY 12/03/16  Yes Burchette, Elberta Fortis, MD  montelukast (SINGULAIR) 10 MG tablet Take 1 tablet (10 mg total) by mouth at bedtime. 05/10/16  Yes Burchette, Elberta Fortis, MD  predniSONE (DELTASONE) 20 MG tablet Take 2 tablets (40 mg total)  by mouth daily. Patient taking differently: Take 40 mg by mouth daily as needed (for sickness).  04/16/17  Yes Kirichenko, Tatyana, PA-C  sertraline (ZOLOFT) 100 MG tablet take 1 tablet by mouth once daily 10/04/16  Yes Burchette, Elberta Fortis, MD  amphetamine-dextroamphetamine (ADDERALL XR) 20 MG 24 hr capsule Take 1 capsule (20 mg total) by mouth daily. Fill on or after 06/10/17 06/03/17   Kristian Covey, MD  amphetamine-dextroamphetamine (ADDERALL XR) 20 MG 24  hr capsule Take 1 capsule (20 mg total) by mouth every morning. 06/03/17   Burchette, Elberta Fortis, MD  amphetamine-dextroamphetamine (ADDERALL XR) 20 MG 24 hr capsule Take 1 capsule (20 mg total) by mouth daily. Fill on or after 08/10/17 06/03/17   Burchette, Elberta Fortis, MD  amphetamine-dextroamphetamine (ADDERALL) 10 MG tablet Take One Tablet daily After Lunch Daily. 06/25/17   Burchette, Elberta Fortis, MD  amphetamine-dextroamphetamine (ADDERALL) 10 MG tablet Take one tablet daily after lunch - fill in one month 06/25/17   Burchette, Elberta Fortis, MD  amphetamine-dextroamphetamine (ADDERALL) 10 MG tablet Take one table daily after lunch - fill in two months 06/25/17   Burchette, Elberta Fortis, MD  VENTOLIN HFA 108 (90 Base) MCG/ACT inhaler INHALE 2-3 PUFFS EVERY 6 HOURS IF NEEDED FOR WHEEZING OR SHORTNESS OF BREATH Patient not taking: Reported on 04/16/2017 01/13/17   Burchette, Elberta Fortis, MD  VENTOLIN HFA 108 (90 Base) MCG/ACT inhaler INHALE 2 TO 3 PUFFS INTO THE LUNGS BY MOUTH EVERY 6 HOURS AS NEEDED FOR WHEEZING OR SHORTNESS OF BREATH Patient not taking: Reported on 04/16/2017 04/07/17   Kristian Covey, MD  VENTOLIN HFA 108 (90 Base) MCG/ACT inhaler inhale 2 to 3 puffs by mouth every 6 hours if needed for shortness of breath 08/04/17   Burchette, Elberta Fortis, MD    Family History Family History  Problem Relation Age of Onset  . Asthma Son   . Hypertension Other   . Crohn's disease Father   . Crohn's disease Maternal Aunt   . Esophageal cancer Maternal Uncle   . Breast cancer Maternal Grandmother     Social History Social History  Substance Use Topics  . Smoking status: Never Smoker  . Smokeless tobacco: Never Used  . Alcohol use Yes     Comment: several times monthly     Allergies   Aspirin; Codeine sulfate; and Hydrocodone-acetaminophen   Review of Systems Review of Systems  Constitutional: Negative for chills, diaphoresis, fever and unexpected weight change.  HENT: Negative for congestion, postnasal drip  and sore throat.   Respiratory: Positive for cough, chest tightness, shortness of breath and wheezing.   Cardiovascular: Negative for chest pain.  Gastrointestinal: Negative for abdominal pain, diarrhea and vomiting.  Allergic/Immunologic: Negative for immunocompromised state.  Neurological: Negative for headaches.     Physical Exam Updated Vital Signs BP 115/65   Pulse 80   Temp (!) 97.5 F (36.4 C) (Temporal)   Resp 13   Ht  (1.676 m)   Wt 77.1 kg (170 lb)   SpO2 97%   BMI 27.44 kg/m   Physical Exam  Constitutional: She is oriented to person, place, and time. She appears well-developed and well-nourished. No distress.  HENT:  Head: Normocephalic and atraumatic.  Nasal mucosa normal without edema or rhinorrhea Moist mucous membranes Tonsils and posterior oropharynx normal without erythema, edema or exudates No trismus  Eyes: Pupils are equal, round, and reactive to light. Conjunctivae and EOM are normal.  Neck: Normal range of motion. Neck supple.  Trachea is midline  Cardiovascular: Normal rate, regular rhythm, S1 normal, S2 normal, normal heart sounds and intact distal pulses.   No murmur heard. 2+ carotid, radial and DP pulses bilaterally No lower extremity edema or calf tenderness  Pulmonary/Chest: Effort normal. No respiratory distress. She has decreased breath sounds in the right lower field and the left lower field. She has wheezes in the right upper field, the right middle field, the right lower field, the left upper field, the left middle field and the left lower field. She has no rhonchi. She has no rales. She exhibits no tenderness.  Patient appears anxious but is speaking in full sentences, sitting up in bed on room air during examination Moderate expiratory wheezing in all lung fields most significant anteriorly Question decreased breath sounds in lower lobes bilaterally No rhonchi or rales No tachypnea. Normal O2 sats on room air. No accessory muscle  use, lip pursing, tri poding  Abdominal: Soft. Bowel sounds are normal. She exhibits no distension and no mass. There is no tenderness. There is no rebound and no guarding.  Musculoskeletal: Normal range of motion. She exhibits no deformity.  Lymphadenopathy:    She has no cervical adenopathy.  Neurological: She is alert and oriented to person, place, and time. No sensory deficit.  Skin: Skin is warm and dry. Capillary refill takes less than 2 seconds.  Psychiatric: She has a normal mood and affect. Her behavior is normal. Judgment and thought content normal.  Nursing note and vitals reviewed.    ED Treatments / Results  Labs (all labs ordered are listed, but only abnormal results are displayed) Labs Reviewed  CBC WITH DIFFERENTIAL/PLATELET - Abnormal; Notable for the following:       Result Value   WBC 10.9 (*)    Lymphs Abs 4.8 (*)    All other components within normal limits  BASIC METABOLIC PANEL - Abnormal; Notable for the following:    CO2 21 (*)    Glucose, Bld 107 (*)    All other components within normal limits  I-STAT TROPONIN, ED    EKG  EKG Interpretation  Date/Time:  Monday August 04 2017 08:20:36 EDT Ventricular Rate:  94 PR Interval:    QRS Duration: 104 QT Interval:  385 QTC Calculation: 482 R Axis:   0 Text Interpretation:  Sinus rhythm Anterior infarct, old Confirmed by Lorre Nick ([id number]) on 08/04/2017 8:27:30 AM       Radiology Dg Chest Portable 1 View  Result Date: 08/04/2017 CLINICAL DATA:  Shortness of breath and asthma. EXAM: PORTABLE CHEST 1 VIEW COMPARISON:  04/16/2017 FINDINGS: Lungs are clear without airspace disease pulmonary edema. Heart and mediastinum are within normal limits. The trachea is midline. Negative for a pneumothorax. Bony thorax is intact. IMPRESSION: No active disease. Electronically Signed   By: Richarda Overlie M.D.   On: 08/04/2017 09:57    Procedures Procedures (including critical care time)  Medications Ordered in  ED Medications  albuterol (PROVENTIL,VENTOLIN) solution continuous neb (10 mg/hr Nebulization Given 08/04/17 0949)  magnesium sulfate IVPB 2 g 50 mL (0 g Intravenous Stopped 08/04/17 0933)  ipratropium (ATROVENT) nebulizer solution 0.5 mg (0.5 mg Nebulization Given 08/04/17 0949)     Initial Impression / Assessment and Plan / ED Course  I have reviewed the triage vital signs and the nursing notes.  Pertinent labs & imaging results that were available during my care of the patient were reviewed by me and considered in my medical decision making (see chart for details).  Clinical Course as of Aug 04 1014  Mon Aug 04, 2017  1610 Reassessed patient; she is sitting up in bed appears less anxious than before. Reports mild improvement in breathing and chest tightness since meds so far. Wheezing unchanged.  [CG]  [phone] [facility] radiology regarding delay in x-ray report; told tech has not marked x-ray as completed.  [CG]    Clinical Course User Index [CG] Liberty Handy, PA-C    51 year old female with history of hypertension and poorly controlled asthma presents to ED for asthma exacerbation. Symptoms started last night, unresponsive to Qvair or Ventolin at home. States feels similar to previous asthma attacks but worse than most recent attacks. Averages 3 flares yearly per chart review. Has required admission in the past for asthma, came close to intubation the past many years ago. No fevers, chills, preceding URI, chest pain. She received Solu-Medrol, albuterol, Atrovent on route which only mildly helped her symptoms.  On initial exam, patient looks anxious and uncomfortable but she is able to speak in full sentences.  Vital signs are within normal limits without fever, tachycardia, hypotension, tachypnea or hypoxia. She is sitting straight up in bed without tripoding, stridor. Questionable accessory muscle use. She has diffuse expiratory wheezing on initial exam, this was after  medications on route. Magnesium sulfate was started given poor response to medications.  Upon reevaluation, patient still had moderate wheezing diffusely. She appeared less anxious but still endorse chest tightness, wheezing and shortness of breath. Lab work today including CBC, BMP, troponin unremarkable. Chest x-ray and EKG unremarkable. Patient placed on continuous neb tx. Requested admission.   Final Clinical Impressions(s) / ED Diagnoses   Final diagnoses:  Moderate persistent asthma with exacerbation    New Prescriptions New Prescriptions   No medications on file     Liberty Handy, PA-C 08/04/17 1016

## 2017-08-04 NOTE — ED Provider Notes (Addendum)
Medical screening examination/treatment/procedure(s) were conducted as a shared visit with non-physician practitioner(s) and myself.  I personally evaluated the patient during the encounter.   EKG Interpretation  Date/Time:  Monday August 04 2017 08:20:36 EDT Ventricular Rate:  94 PR Interval:    QRS Duration: 104 QT Interval:  385 QTC Calculation: 482 R Axis:   0 Text Interpretation:  Sinus rhythm Anterior infarct, old Confirmed by Lorre Nick ([id number]) on 08/04/2017 8:27:17 AM     51 year old female with history of asthma presents with stress of breath that began last night. On exam she has moderate wheezing throughout her lung feels. Have been treated by EMS with Solu-Medrol, albuterol. Will order chest x-ray here as well as more albuterol treatments and likely admitted to the hospital   Lorre Nick, MD 08/04/17 6045    Lorre Nick, MD 08/04/17 [phone]

## 2017-08-04 NOTE — ED Notes (Signed)
Pt verbalized discharge instructions. VSS, resp e/u, nad.

## 2017-08-04 NOTE — ED Triage Notes (Signed)
Pt was driving to work and began feeling increasingly sob.  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  Given 15 mg alubterol, 1 mg atrovent, 125 solumedrol en-route with minimal improvement.

## 2017-08-04 NOTE — Discharge Summary (Signed)
Physician Discharge Summary  [name]:[id number] DOB: December 11, 1965 DOA: 08/04/2017  PCP: Kristian Covey, MD  Admit date: 08/04/2017 Discharge date: 08/04/2017  Admitted From: Home Disposition: to Home Recommendations for Outpatient Follow-up:  1. Follow up with PCP in 1-2 weeks  Home Health:  No Equipment/Devices: none  Discharge Condition: Stable  CODE STATUS: FULL,   Diet recommendation: regular Brief/Interim Summary: 51 y.o. female with medical history significant for HTN,GERD, anxiety, depression, presenting with increasing shortness of breath, beginning last night, accompanied by moderate wheezing. She also reports some dry cough, without hemoptysis. She denies any fever, chills or night sweats. She denies any chest pain or palpitations. She denies any recent long distance trips, or anywhere of any changes in her environment. She denies any sick contacts. She does not smoke. She tried at home her inhaler regimen, without significant help. Her last exacerbation was in June of this year. In the past, the patient had asthma exacerbation requiring admission, last 3 years ago. The patient reports being compliant with her meds. She states that in the past, she had taken oral steroids, with better control of her asthma. No new stresses. On transport, the patient received Solu-Medrol 125 mg IV 1, albuterol and Atrovent, without significant response. On presentation, the patient was still wheezing. She was placed on continuous nebulizer treatment with albuterol,Magnesium Sulfate added with some improvement. No BiPAP was necessary. She was admitted for further mamnagement  Discharge Diagnoses:  Active Problems:   Asthma exacerbation   Anxiety state   Attention deficit disorder   Hypertension   GERD  Acute respiratory distress due to Asthma exacerbation: somewhat improved after steroids, magnesium sulfate and nebs treatment. CXR w/o evidence of acute process.O2 sats normal. WBC  10.9 . Afebrile. VSS Received Duoneb and one dose of Albuterol nebs with significant improvement. She is to be discharged in stable condition Received one dose of Decadron 10 mg  Prior to discharge  Prescription for prednisone given at 40 mg for 5 days, recommending using in the future as needed  Recommend to use albuterol inhaler every 4 hrs as needed  Resume her home inhalers, Singulair   Anxiety and Depression Continue Zoloft   ADD Continue Adderal   Hypertension BP 115/65   Pulse 80 Continue home anti-hypertensive medications   GERD, no acute symptoms Continue PPI    Discharge Instructions   Allergies as of 08/04/2017      Reactions   Aspirin Shortness Of Breath   REACTION: wheezing problems   Codeine Sulfate Hives, Nausea And Vomiting   REACTION: rash, itiching   Hydrocodone-acetaminophen Itching, Rash      Medication List    TAKE these medications   albuterol (2.5 MG/3ML) 0.083% nebulizer solution Commonly known as:  PROVENTIL Take 3 mLs (2.5 mg total) by nebulization every 6 (six) hours as needed for wheezing or shortness of breath. What changed:  Another medication with the same name was changed. Make sure you understand how and when to take each.   albuterol 108 (90 Base) MCG/ACT inhaler Commonly known as:  VENTOLIN HFA Inhale 2 puffs into the lungs every 4 (four) hours as needed for wheezing or shortness of breath. What changed:  Another medication with the same name was changed. Make sure you understand how and when to take each.   VENTOLIN HFA 108 (90 Base) MCG/ACT inhaler Generic drug:  albuterol INHALE 2-3 PUFFS EVERY 6 HOURS IF NEEDED FOR WHEEZING OR SHORTNESS OF BREATH What changed:  Another medication with the  same name was changed. Make sure you understand how and when to take each.   VENTOLIN HFA 108 (90 Base) MCG/ACT inhaler Generic drug:  albuterol INHALE 2 TO 3 PUFFS INTO THE LUNGS BY MOUTH EVERY 6 HOURS AS NEEDED FOR WHEEZING OR  SHORTNESS OF BREATH What changed:  Another medication with the same name was changed. Make sure you understand how and when to take each.   VENTOLIN HFA 108 (90 Base) MCG/ACT inhaler Generic drug:  albuterol inhale 2 to 3 puffs by mouth every 6 hours if needed for shortness of breath What changed:  See the new instructions.   amphetamine-dextroamphetamine 20 MG 24 hr capsule Commonly known as:  ADDERALL XR Take 1 capsule (20 mg total) by mouth daily. Fill on or after 06/10/17   amphetamine-dextroamphetamine 20 MG 24 hr capsule Commonly known as:  ADDERALL XR Take 1 capsule (20 mg total) by mouth every morning.   amphetamine-dextroamphetamine 20 MG 24 hr capsule Commonly known as:  ADDERALL XR Take 1 capsule (20 mg total) by mouth daily. Fill on or after 08/10/17   amphetamine-dextroamphetamine 10 MG tablet Commonly known as:  ADDERALL Take One Tablet daily After Lunch Daily.   amphetamine-dextroamphetamine 10 MG tablet Commonly known as:  ADDERALL Take one tablet daily after lunch - fill in one month   amphetamine-dextroamphetamine 10 MG tablet Commonly known as:  ADDERALL Take one table daily after lunch - fill in two months   amphetamine-dextroamphetamine 25 MG 24 hr capsule Commonly known as:  ADDERALL XR Take 25 mg by mouth daily.   beclomethasone 80 MCG/ACT inhaler Commonly known as:  QVAR REDIHALER Inhale 80 mcg into the lungs 2 (two) times daily.   cloNIDine 0.1 MG tablet Commonly known as:  CATAPRES Take 0.1 mg by mouth at bedtime.   esomeprazole 40 MG capsule Commonly known as:  NEXIUM Take 40 mg by mouth daily at 12 noon.   losartan-hydrochlorothiazide 100-12.5 MG tablet Commonly known as:  HYZAAR TAKE 1 TABLET BY MOUTH DAILY   montelukast 10 MG tablet Commonly known as:  SINGULAIR Take 1 tablet (10 mg total) by mouth at bedtime.   predniSONE 20 MG tablet Commonly known as:  DELTASONE Take 2 tablets (40 mg total) by mouth daily. What changed:  when  to take this  reasons to take this   sertraline 100 MG tablet Commonly known as:  ZOLOFT take 1 tablet by mouth once daily       Allergies  Allergen Reactions  . Aspirin Shortness Of Breath    REACTION: wheezing problems  . Codeine Sulfate Hives and Nausea And Vomiting    REACTION: rash, itiching  . Hydrocodone-Acetaminophen Itching and Rash    Consultations:   Physician/Group None    Procedures/Studies: Dg Chest Portable 1 View  Result Date: 08/04/2017 CLINICAL DATA:  Shortness of breath and asthma. EXAM: PORTABLE CHEST 1 VIEW COMPARISON:  04/16/2017 FINDINGS: Lungs are clear without airspace disease pulmonary edema. Heart and mediastinum are within normal limits. The trachea is midline. Negative for a pneumothorax. Bony thorax is intact. IMPRESSION: No active disease. Electronically Signed   By: Richarda Overlie M.D.   On: 08/04/2017 09:57   EKG sinus rhythm no ACS, troponin negative White count 10.9    Subjective:   Discharge Exam: Vitals:   08/04/17 1130 08/04/17 1132  BP: 136/89   Pulse: 99   Resp: 18   Temp:    SpO2: 93% 100%   Vitals:   08/04/17 0945 08/04/17 0952 08/04/17  1130 08/04/17 1132  BP: 115/65  136/89   Pulse: 80  99   Resp: 13  18   Temp:      TempSrc:      SpO2: 97% 97% 93% 100%  Weight:      Height:        General: Pt is alert, awake, not in acute distress Cardiovascular: RRR, S1/S2 +, no rubs, no gallops Respiratory: CTA  Without wheezing,  Rhonchi or rales  Abdominal: Soft, NT, ND, bowel sounds + Extremities: no edema, no cyanosis    The results of significant diagnostics from this hospitalization (including imaging, microbiology, ancillary and laboratory) are listed below for reference.     Microbiology: No results found for this or any previous visit (from the past 240 hour(s)).   Labs: BNP (last 3 results) No results for input(s): BNP in the last 8760 hours. Basic Metabolic Panel:  Recent Labs Lab 08/04/17 0826  NA  139  K 3.5  CL 105  CO2 21*  GLUCOSE 107*  BUN 12  CREATININE 0.94  CALCIUM 9.3   Liver Function Tests: No results for input(s): AST, ALT, ALKPHOS, BILITOT, PROT, ALBUMIN in the last 168 hours. No results for input(s): LIPASE, AMYLASE in the last 168 hours. No results for input(s): AMMONIA in the last 168 hours. CBC:  Recent Labs Lab 08/04/17 0826  WBC 10.9*  NEUTROABS 5.0  HGB 13.1  HCT 39.3  MCV 83.1  PLT 319   Cardiac Enzymes: No results for input(s): CKTOTAL, CKMB, CKMBINDEX, TROPONINI in the last 168 hours. BNP: Invalid input(s): POCBNP CBG: No results for input(s): GLUCAP in the last 168 hours. D-Dimer No results for input(s): DDIMER in the last 72 hours. Hgb A1c No results for input(s): HGBA1C in the last 72 hours. Lipid Profile No results for input(s): CHOL, HDL, LDLCALC, TRIG, CHOLHDL, LDLDIRECT in the last 72 hours. Thyroid function studies No results for input(s): TSH, T4TOTAL, T3FREE, THYROIDAB in the last 72 hours.  Invalid input(s): FREET3 Anemia work up No results for input(s): VITAMINB12, FOLATE, FERRITIN, TIBC, IRON, RETICCTPCT in the last 72 hours. Urinalysis    Component Value Date/Time   COLORURINE AMBER (A) 03/08/2017 1550   APPEARANCEUR TURBID (A) 03/08/2017 1550   LABSPEC 1.031 (H) 03/08/2017 1550   PHURINE 5.0 03/08/2017 1550   GLUCOSEU NEGATIVE 03/08/2017 1550   HGBUR LARGE (A) 03/08/2017 1550   BILIRUBINUR NEGATIVE 03/08/2017 1550   KETONESUR 5 (A) 03/08/2017 1550   PROTEINUR 30 (A) 03/08/2017 1550   UROBILINOGEN 0.2 11/12/2014 1308   NITRITE NEGATIVE 03/08/2017 1550   LEUKOCYTESUR NEGATIVE 03/08/2017 1550   Sepsis Labs Invalid input(s): PROCALCITONIN,  WBC,  LACTICIDVEN Microbiology No results found for this or any previous visit (from the past 240 hour(s)).   Time coordinating discharge: Over 30 minutes  SIGNED: Marcos Eke, MD  [facility] 08/04/2017, 2:49 PM   If 7PM-7AM, please contact  night-coverage [email or web address] Password [email or web address]

## 2017-08-04 NOTE — ED Notes (Signed)
Family at bedside. 

## 2017-08-04 NOTE — ED Notes (Signed)
Hospitalist at bedside 

## 2017-08-04 NOTE — Discharge Instructions (Addendum)
You were evaluated and treated for a severe asthma exacerbation. This appears to be well controlled at time of urine discharge from hospital. Please take 40 mg of prednisone every morning with breakfast for the next 4-5 days. You have a refill on this prescription and would recommend you use it in the future as needed. Please also use your inhaler every 4 hours for the next 24 hours then every 4 hours as needed thereafter.

## 2017-08-04 NOTE — H&P (Signed)
History and Physical    [name] ZOX:[id number] DOB: 04-24-1966 DOA: 08/04/2017   PCP: [name] Covey, MD   Patient coming from:  Home    Chief Complaint: Shortness of Breath and Wheezing   HPI: [name] is a 51 y.o. female with medical history significant for HTN,GERD, anxiety, depression, presenting with increasing shortness of breath, beginning last night, accompanied by moderate wheezing. She also reports some dry cough, without hemoptysis. She denies any fever, chills or night sweats. She denies any chest pain or palpitations. She denies any recent long distance trips, or anywhere of any changes in her environment. She denies any sick contacts. She does not smoke. She tried at home her inhaler regimen, without significant help. Her last exacerbation was in June of this year. In the past, the patient had asthma exacerbation requiring admission, last 3 years ago. The patient reports being compliant with her meds. She states that in the past, she had taken oral steroids, with better control of her asthma. No new stresses.  ED Course:  BP 115/65   Pulse 80   Temp (!) 97.5 F (36.4 C) (Temporal)   Resp 13   Ht  (1.676 m)   Wt 77.1 kg (170 lb)   SpO2 97%   BMI 27.44 kg/m    On transport, the patient received Solu-Medrol 125 mg IV 1, albuterol and Atrovent, without significant response. On presentation, the patient was still wheezing. She was placed on continuous nebulizer treatment with albuterol,MAgnesium Sulfate added with some improvement. No BiPAP was necessary. EKG sinus rhythm no ACS, troponin negative White count 10.9 Chest x-ray NAD  Review of Systems:  As per HPI otherwise all other systems reviewed and are negative  Past Medical History:  Diagnosis Date  . Asthma   . Blood transfusion   . Depression   . GERD (gastroesophageal reflux disease)   . Hepatitis A   . Hypertension   . Jaundice   . Migraines   . Ovarian cyst   . UTI (urinary  tract infection)     Past Surgical History:  Procedure Laterality Date  . APPENDECTOMY  1994  . OPEN REDUCTION INTERNAL FIXATION (ORIF) METACARPAL Right 08/15/2016   Procedure: OPEN REDUCTION INTERNAL FIXATION (ORIF) right ring finger middle phalanx fracture;  Surgeon: Betha Loa, MD;  Location: [facility];  Service: Orthopedics;  Laterality: Right;  . OVARIAN CYST REMOVAL    . TONSILLECTOMY AND ADENOIDECTOMY      Social History Social History   Social History  . Marital status: Married    Spouse name: N/A  . Number of children: 2  . Years of education: N/A   Occupational History  . Building surveyor Rep for [employer]   .  [employer]   Social History Main Topics  . Smoking status: Never Smoker  . Smokeless tobacco: Never Used  . Alcohol use Yes     Comment: several times monthly  . Drug use: No  . Sexual activity: Yes    Birth control/ protection: None     Comment: husband has had a vasectomy   Other Topics Concern  . Not on file   Social History Narrative  . No narrative on file     Allergies  Allergen Reactions  . Aspirin Shortness Of Breath    REACTION: wheezing problems  . Codeine Sulfate Hives and Nausea And Vomiting    REACTION: rash, itiching  . Hydrocodone-Acetaminophen Itching and Rash  Family History  Problem Relation Age of Onset  . Asthma Son   . Hypertension Other   . Crohn's disease Father   . Crohn's disease Maternal Aunt   . Esophageal cancer Maternal Uncle   . Breast cancer Maternal Grandmother       Prior to Admission medications   Medication Sig Start Date End Date Taking? Authorizing Provider  albuterol (PROVENTIL) (2.5 MG/3ML) 0.083% nebulizer solution Take 3 mLs (2.5 mg total) by nebulization every 6 (six) hours as needed for wheezing or shortness of breath. 06/14/16  Yes Burchette, Elberta Fortis, MD  albuterol (VENTOLIN HFA) 108 (90 Base) MCG/ACT inhaler Inhale 2 puffs into the lungs every 4 (four) hours  as needed for wheezing or shortness of breath. 12/25/16  Yes Burchette, Elberta Fortis, MD  amphetamine-dextroamphetamine (ADDERALL XR) 25 MG 24 hr capsule Take 25 mg by mouth daily. 07/24/17  Yes [provider]  Beclomethasone Diprop HFA (QVAR REDIHALER) 80 MCG/ACT AERB Inhale 80 mcg into the lungs 2 (two) times daily. 01/15/17  Yes Burchette, Elberta Fortis, MD  cloNIDine (CATAPRES) 0.1 MG tablet Take 0.1 mg by mouth at bedtime. 06/09/17  Yes [provider]  esomeprazole (NEXIUM) 40 MG capsule Take 40 mg by mouth daily at 12 noon.   Yes [provider]  losartan-hydrochlorothiazide (HYZAAR) 100-12.5 MG tablet TAKE 1 TABLET BY MOUTH DAILY 12/03/16  Yes Burchette, Elberta Fortis, MD  montelukast (SINGULAIR) 10 MG tablet Take 1 tablet (10 mg total) by mouth at bedtime. 05/10/16  Yes Burchette, Elberta Fortis, MD    predniSONE (DELTASONE) 20 MG tablet Take 2 tablets (40 mg total) by mouth daily. Patient taking differently: Take 40 mg by mouth daily as needed (for sickness).  04/16/17  Yes Kirichenko, Tatyana, PA-C  sertraline (ZOLOFT) 100 MG tablet take 1 tablet by mouth once daily 10/04/16  Yes Burchette, Elberta Fortis, MD  amphetamine-dextroamphetamine (ADDERALL XR) 20 MG 24 hr capsule Take 1 capsule (20 mg total) by mouth daily. Fill on or after 06/10/17 06/03/17   [name] Covey, MD  amphetamine-dextroamphetamine (ADDERALL XR) 20 MG 24 hr capsule Take 1 capsule (20 mg total) by mouth every morning. 06/03/17   Burchette, Elberta Fortis, MD  amphetamine-dextroamphetamine (ADDERALL XR) 20 MG 24 hr capsule Take 1 capsule (20 mg total) by mouth daily. Fill on or after 08/10/17 06/03/17   Burchette, Elberta Fortis, MD  amphetamine-dextroamphetamine (ADDERALL) 10 MG tablet Take One Tablet daily After Lunch Daily. 06/25/17   Burchette, Elberta Fortis, MD  amphetamine-dextroamphetamine (ADDERALL) 10 MG tablet Take one tablet daily after lunch - fill in one month 06/25/17   Burchette, Elberta Fortis, MD  amphetamine-dextroamphetamine (ADDERALL) 10 MG  tablet Take one table daily after lunch - fill in two months 06/25/17   Burchette, Elberta Fortis, MD  VENTOLIN HFA 108 (90 Base) MCG/ACT inhaler INHALE 2-3 PUFFS EVERY 6 HOURS IF NEEDED FOR WHEEZING OR SHORTNESS OF BREATH Patient not taking: Reported on 04/16/2017 01/13/17   [name] Covey, MD  VENTOLIN HFA 108 (90 Base) MCG/ACT inhaler INHALE 2 TO 3 PUFFS INTO THE LUNGS BY MOUTH EVERY 6 HOURS AS NEEDED FOR WHEEZING OR SHORTNESS OF BREATH Patient not taking: Reported on 04/16/2017 04/07/17   [name] Covey, MD  VENTOLIN HFA 108 (90 Base) MCG/ACT inhaler inhale 2 to 3 puffs by mouth every 6 hours if needed for shortness of breath 08/04/17   [name] Covey, MD    Physical Exam:  Vitals:   08/04/17 0915 08/04/17 0930 08/04/17 0945 08/04/17  0952  BP: (!) 119/97 122/76 115/65   Pulse: 84 85 80   Resp: Temp:      TempSrc:      SpO2: 99% 98% 97% 97%  Weight:      Height:       Constitutional: NAD, Somewhat anxious, alert and conversant  Eyes: PERRL, lids and conjunctivae normal ENMT: Mucous membranes are moist, without exudate or lesions  Neck: normal, supple, no masses, no thyromegaly Respiratory: Significant wheezing throughout, no rhonchi or rails. No accessory muscle use.  Cardiovascular: Regular rate and rhythm,  murmur, rubs or gallops. No extremity edema. 2+ pedal pulses. No carotid bruits.  Abdomen: Soft, non tender, No hepatosplenomegaly. Bowel sounds positive.  Musculoskeletal: no clubbing / cyanosis. Moves all extremities Skin: no jaundice, No lesions.  Neurologic: Sensation intact  Strength equal in all extremities Psychiatric:   Alert and oriented x 3. Anxious mood      Labs on Admission: I have personally reviewed following labs and imaging studies  CBC:  Recent Labs Lab 08/04/17 0826  WBC 10.9*  NEUTROABS 5.0  HGB 13.1  HCT 39.3  MCV 83.1  PLT 319    Basic Metabolic Panel:  Recent Labs Lab 08/04/17 0826  NA 139  K 3.5  CL 105  CO2 21*    GLUCOSE 107*  BUN 12  CREATININE 0.94  CALCIUM 9.3    GFR: Estimated Creatinine Clearance: 75.1 mL/min (by C-G formula based on SCr of 0.94 mg/dL).  Liver Function Tests: No results for input(s): AST, ALT, ALKPHOS, BILITOT, PROT, ALBUMIN in the last 168 hours. No results for input(s): LIPASE, AMYLASE in the last 168 hours. No results for input(s): AMMONIA in the last 168 hours.  Coagulation Profile: No results for input(s): INR, PROTIME in the last 168 hours.  Cardiac Enzymes: No results for input(s): CKTOTAL, CKMB, CKMBINDEX, TROPONINI in the last 168 hours.  BNP (last 3 results) No results for input(s): PROBNP in the last 8760 hours.  HbA1C: No results for input(s): HGBA1C in the last 72 hours.  CBG: No results for input(s): GLUCAP in the last 168 hours.  Lipid Profile: No results for input(s): CHOL, HDL, LDLCALC, TRIG, CHOLHDL, LDLDIRECT in the last 72 hours.  Thyroid Function Tests: No results for input(s): TSH, T4TOTAL, FREET4, T3FREE, THYROIDAB in the last 72 hours.  Anemia Panel: No results for input(s): VITAMINB12, FOLATE, FERRITIN, TIBC, IRON, RETICCTPCT in the last 72 hours.  Urine analysis:    Component Value Date/Time   COLORURINE AMBER (A) 03/08/2017 1550   APPEARANCEUR TURBID (A) 03/08/2017 1550   LABSPEC 1.031 (H) 03/08/2017 1550   PHURINE 5.0 03/08/2017 1550   GLUCOSEU NEGATIVE 03/08/2017 1550   HGBUR LARGE (A) 03/08/2017 1550   BILIRUBINUR NEGATIVE 03/08/2017 1550   KETONESUR 5 (A) 03/08/2017 1550   PROTEINUR 30 (A) 03/08/2017 1550   UROBILINOGEN 0.2 11/12/2014 1308   NITRITE NEGATIVE 03/08/2017 1550   LEUKOCYTESUR NEGATIVE 03/08/2017 1550    Sepsis Labs: (procalcitonin:4,lacticidven:4) )No results found for this or any previous visit (from the past 240 hour(s)).   Radiological Exams on Admission: Dg Chest Portable 1 View  Result Date: 08/04/2017 CLINICAL DATA:  Shortness of breath and asthma. EXAM: PORTABLE CHEST 1 VIEW  COMPARISON:  04/16/2017 FINDINGS: Lungs are clear without airspace disease pulmonary edema. Heart and mediastinum are within normal limits. The trachea is midline. Negative for a pneumothorax. Bony thorax is intact. IMPRESSION: No active disease. Electronically Signed   By: Richarda Overlie  M.D.   On: 08/04/2017 09:57    EKG: Independently reviewed.  Assessment/Plan Active Problems:   Anxiety state   Attention deficit disorder   Hypertension   GERD   Acute respiratory distress due to Asthma exacerbation: somewhat improved after steroids, magnesium sulfate and nebs treatment. CXR w/o evidence of acute process.O2 sats normal. WBC 10.9 . Afebrile. VSS med surg obs  Duonebs Q4  Albuterol q 2 prn  Solumedrol 60 Q8  PFTs as outpt Anticipated that if symptoms improved late today, she may be discharged in stable condition, would need to follow-up with PCP versus pulmonary for med adjustment. She may benefit from oral prednisone to prevent further asthma exacerbation  Anxiety and Depression Continue Zoloft   ADD Continue Adderal   Hypertension BP 115/65   Pulse 80 . She took her HCTZ today  Controlled Continue home anti-hypertensive medications   GERD, no acute symptoms Continue PPI    DVT prophylaxis:  Lovenox Code Status:    Full Family Communication:  Discussed with patient Disposition Plan: Expect patient to be discharged to home after condition improves Consults called:    None Admission status: MedSurg Obs   Delani Kohli E, PA-C [facility]   08/04/2017, 10:24 AM

## 2017-09-03 ENCOUNTER — Other Ambulatory Visit: Payer: Self-pay | Admitting: Family Medicine

## 2017-09-03 NOTE — Telephone Encounter (Signed)
Pt request refill  amphetamine-dextroamphetamine (ADDERALL XR) 20 MG 24 hr capsule   3 mo supply  Pt has appt 11/07

## 2017-09-05 NOTE — Telephone Encounter (Signed)
Called the pt and left a voice mail to call back. Need to inform the pt that Dr. Caryl NeverBurchette is out until 11/5. Could she hold off on this Rx being refilled until her next app 11/7?

## 2017-09-08 NOTE — Telephone Encounter (Signed)
Pt advised.

## 2017-09-10 ENCOUNTER — Ambulatory Visit: Payer: BLUE CROSS/BLUE SHIELD | Admitting: Family Medicine

## 2017-09-12 ENCOUNTER — Telehealth: Payer: Self-pay | Admitting: Family Medicine

## 2017-09-12 NOTE — Telephone Encounter (Signed)
Pt request refill  amphetamine-dextroamphetamine (ADDERALL XR) 20 MG 24 hr capsule  amphetamine-dextroamphetamine (ADDERALL) 10 MG tablet  Pt was originally going to pick up rx at [facility] this past, but her mother fell had to to go hospital, mom is bedridden, and she has had to look after her.  Pt has rescheduled to next week, but hopes to pick up her scripts today because she has to work out of town next week and is completely out

## 2017-09-14 NOTE — Telephone Encounter (Signed)
Refill at follow up visit.

## 2017-09-15 NOTE — Telephone Encounter (Signed)
Left message on machine for patient to return our call to schedule an appointment to refill medication.

## 2017-09-15 NOTE — Telephone Encounter (Signed)
Pt has appt on Friday 11/16

## 2017-09-19 ENCOUNTER — Ambulatory Visit: Payer: BLUE CROSS/BLUE SHIELD | Admitting: Family Medicine

## 2017-09-26 ENCOUNTER — Ambulatory Visit (HOSPITAL_COMMUNITY)
Admission: EM | Admit: 2017-09-26 | Discharge: 2017-09-26 | Disposition: A | Discharge status: Home / Self Care | Payer: BLUE CROSS/BLUE SHIELD | Attending: Physician Assistant | Admitting: Physician Assistant

## 2017-09-26 ENCOUNTER — Other Ambulatory Visit: Payer: Self-pay | Admitting: Family Medicine

## 2017-09-26 ENCOUNTER — Encounter (HOSPITAL_COMMUNITY): Payer: Self-pay | Admitting: Emergency Medicine

## 2017-09-26 ENCOUNTER — Other Ambulatory Visit: Payer: Self-pay

## 2017-09-26 DIAGNOSIS — R05 Cough: Secondary | ICD-10-CM | POA: Diagnosis not present

## 2017-09-26 DIAGNOSIS — R059 Cough, unspecified: Secondary | ICD-10-CM

## 2017-09-26 DIAGNOSIS — Z8709 Personal history of other diseases of the respiratory system: Secondary | ICD-10-CM

## 2017-09-26 MED ORDER — ALBUTEROL SULFATE HFA 108 (90 BASE) MCG/ACT IN AERS: 1.0000 | INHALATION_SPRAY | Freq: Four times a day (QID) | RESPIRATORY_TRACT | 0 refills | Status: DC | PRN

## 2017-09-26 NOTE — Discharge Instructions (Signed)
Okay to schedule your inhlaer to every 4-6 hours as needed.

## 2017-09-26 NOTE — ED Provider Notes (Signed)
09/26/2017 12:25 PM   DOB: Apr 04, 1966 / MRN: [id number]  SUBJECTIVE:  [name] is a 51 y.o. female presenting for cough.  Has a history of asthma that has required hospitalization.  No history of diabetes.  Would like for her inhaler to be refilled and feels that she does not need anything else today. She does take QVAR faithfully and does not need this refilled today.  Is taking pseudoephedrine at this time.     She is allergic to aspirin; codeine sulfate; and hydrocodone-acetaminophen.   She  has a past medical history of Asthma, Blood transfusion, Depression, GERD (gastroesophageal reflux disease), Hepatitis A, Hypertension, Jaundice, Migraines, Ovarian cyst, and UTI (urinary tract infection).    She  reports that  has never smoked. she has never used smokeless tobacco. She reports that she drinks alcohol. She reports that she does not use drugs. She  reports that she currently engages in sexual activity. She reports using the following method of birth control/protection: None. The patient  has a past surgical history that includes Appendectomy (1994); Ovarian cyst removal; Tonsillectomy and adenoidectomy; and Open reduction internal fixation (orif) metacarpal (Right, 08/15/2016).  Her family history includes Asthma in her son; Breast cancer in her maternal grandmother; Crohn's disease in her father and maternal aunt; Esophageal cancer in her maternal uncle; Hypertension in her other.  Review of Systems  Constitutional: Negative for chills and fever.  Respiratory: Positive for cough. Negative for hemoptysis, sputum production, shortness of breath and wheezing.   Cardiovascular: Negative for chest pain and leg swelling.  Gastrointestinal: Negative for nausea.  Skin: Negative for rash.    OBJECTIVE:  BP (!) 156/91   Pulse 97   Temp 98.5 F (36.9 C)   Resp 18   SpO2 100%   Physical Exam  Constitutional: She is active.  Non-toxic appearance.  Cardiovascular: Normal rate,  regular rhythm, S1 normal, S2 normal, normal heart sounds and intact distal pulses. Exam reveals no gallop, no friction rub and no decreased pulses.  No murmur heard. Pulmonary/Chest: Effort normal and breath sounds normal. No tachypnea. No respiratory distress. She has no wheezes. She has no rales. She exhibits no tenderness.  Abdominal: She exhibits no distension.  Musculoskeletal: She exhibits no edema.  Neurological: She is alert.  Skin: Skin is warm and dry. She is not diaphoretic. No pallor.    No results found for this or any previous visit (from the past 72 hour(s)).  No results found.  ASSESSMENT AND PLAN:  The primary encounter diagnosis was Cough. A diagnosis of History of asthma was also pertinent to this visit. She is here for a refill for albuterol and her presentation at this time does not really lend to an asthma exacerbation. Will refill her med per her request.     The patient is advised to call or return to clinic if she does not see an improvement in symptoms, or to seek the care of the closest emergency department if she worsens with the above plan.   Deliah BostonMichael Areen Trautner, MHS, PA-C 09/26/2017 12:25 PM    [name], [name] Bhatt L, PA-C 09/26/17 1225

## 2017-09-26 NOTE — ED Triage Notes (Signed)
Pt c/o bad cough, cold symptoms, taking mucinex, pt lost her inhaler, hx of asthma. Needs inhaler to go home with.

## 2017-10-07 ENCOUNTER — Other Ambulatory Visit: Payer: Self-pay | Admitting: Family Medicine

## 2017-10-09 ENCOUNTER — Emergency Department (HOSPITAL_BASED_OUTPATIENT_CLINIC_OR_DEPARTMENT_OTHER)
Admission: EM | Admit: 2017-10-09 | Discharge: 2017-10-09 | Disposition: A | Discharge status: Home / Self Care | Payer: BLUE CROSS/BLUE SHIELD | Attending: Emergency Medicine | Admitting: Emergency Medicine

## 2017-10-09 ENCOUNTER — Other Ambulatory Visit: Payer: Self-pay

## 2017-10-09 ENCOUNTER — Encounter (HOSPITAL_BASED_OUTPATIENT_CLINIC_OR_DEPARTMENT_OTHER): Payer: Self-pay

## 2017-10-09 DIAGNOSIS — R062 Wheezing: Secondary | ICD-10-CM | POA: Insufficient documentation

## 2017-10-09 DIAGNOSIS — J45909 Unspecified asthma, uncomplicated: Secondary | ICD-10-CM | POA: Diagnosis not present

## 2017-10-09 DIAGNOSIS — Z79899 Other long term (current) drug therapy: Secondary | ICD-10-CM | POA: Insufficient documentation

## 2017-10-09 DIAGNOSIS — R05 Cough: Secondary | ICD-10-CM | POA: Diagnosis present

## 2017-10-09 DIAGNOSIS — R0602 Shortness of breath: Secondary | ICD-10-CM | POA: Diagnosis not present

## 2017-10-09 DIAGNOSIS — J069 Acute upper respiratory infection, unspecified: Secondary | ICD-10-CM | POA: Diagnosis not present

## 2017-10-09 DIAGNOSIS — R059 Cough, unspecified: Secondary | ICD-10-CM

## 2017-10-09 DIAGNOSIS — I10 Essential (primary) hypertension: Secondary | ICD-10-CM | POA: Insufficient documentation

## 2017-10-09 MED ORDER — ALBUTEROL SULFATE HFA 108 (90 BASE) MCG/ACT IN AERS
INHALATION_SPRAY | RESPIRATORY_TRACT | Status: AC
  Administered 2017-10-09: 2
  Filled 2017-10-09: qty 6.7

## 2017-10-09 MED ORDER — PREDNISONE 10 MG PO TABS: 40.0000 mg | ORAL_TABLET | Freq: Every day | ORAL | 0 refills | Status: AC

## 2017-10-09 MED ORDER — IPRATROPIUM-ALBUTEROL 0.5-2.5 (3) MG/3ML IN SOLN
RESPIRATORY_TRACT | Status: AC
  Administered 2017-10-09: 3 mL
  Filled 2017-10-09: qty 3

## 2017-10-09 MED ORDER — ALBUTEROL SULFATE (2.5 MG/3ML) 0.083% IN NEBU
INHALATION_SOLUTION | RESPIRATORY_TRACT | Status: AC
  Administered 2017-10-09: 2.5 mg
  Filled 2017-10-09: qty 3

## 2017-10-09 NOTE — ED Provider Notes (Signed)
[facility] EMERGENCY DEPARTMENT Provider Note   CSN: [id number] Arrival date & time: 10/09/17  1551     History   Chief Complaint Chief Complaint  Patient presents with  . Asthma    HPI [name] is a 51 y.o. female presenting with SOB.   Patient states that for the past several weeks, she has had URI symptoms.  She initially had sore throat, nasal congestion, and ear pain, but this has improved, and she just has a lingering cough.  Cough is mildly productive and worse at night.  She was at work today when she developed some worsening shortness of breath which felt like her asthma attack.  She did not have her inhaler with her.  She was given a treatment in the ER, and states that her breathing is improved.  She states her husband also has URI symptoms.  She denies fevers, chills, eye pain, ear pain, sore throat, nasal congestion, chest pain, nausea, vomiting, abdominal pain, urinary symptoms, or abnormal bowel movements.  Pt states that she does not have a lot of time to spend in the ER, as she has to pick up her child from school.  HPI  Past Medical History:  Diagnosis Date  . Asthma   . Blood transfusion   . Depression   . GERD (gastroesophageal reflux disease)   . Hepatitis A   . Hypertension   . Jaundice   . Migraines   . Ovarian cyst   . UTI (urinary tract infection)     Patient Active Problem List   Diagnosis Date Noted  . Asthma exacerbation 08/04/2017  . Respiratory distress   . ALLERGIC RHINITIS 01/29/2010  . Attention deficit disorder 12/14/2009  . Anxiety state 10/23/2009  . Adjustment disorder with mixed anxiety and depressed mood 05/25/2009  . Hypertension 05/25/2009  . Asthma 05/25/2009  . GERD 05/25/2009    Past Surgical History:  Procedure Laterality Date  . APPENDECTOMY  1994  . OPEN REDUCTION INTERNAL FIXATION (ORIF) METACARPAL Right 08/15/2016   Procedure: OPEN REDUCTION INTERNAL FIXATION (ORIF) right ring finger middle  phalanx fracture;  Surgeon: Betha LoaKevin Kuzma, MD;  Location: [facility];  Service: Orthopedics;  Laterality: Right;  . OVARIAN CYST REMOVAL    . TONSILLECTOMY AND ADENOIDECTOMY      OB History    No data available       Home Medications    Prior to Admission medications   Medication Sig Start Date End Date Taking? Authorizing Provider  albuterol (PROVENTIL HFA;VENTOLIN HFA) 108 (90 Base) MCG/ACT inhaler Inhale 1-2 puffs into the lungs every 6 (six) hours as needed for wheezing or shortness of breath. 09/26/17   Ofilia Neaslark, Michael L, PA-C  albuterol (PROVENTIL) (2.5 MG/3ML) 0.083% nebulizer solution Take 3 mLs (2.5 mg total) by nebulization every 6 (six) hours as needed for wheezing or shortness of breath. 06/14/16   Burchette, Elberta FortisBruce W, MD  albuterol (VENTOLIN HFA) 108 (90 Base) MCG/ACT inhaler Inhale 2 puffs into the lungs every 4 (four) hours as needed for wheezing or shortness of breath. 12/25/16   Burchette, Elberta FortisBruce W, MD  amphetamine-dextroamphetamine (ADDERALL XR) 20 MG 24 hr capsule Take 1 capsule (20 mg total) by mouth daily. Fill on or after 06/10/17 Patient taking differently: Take 20 mg by mouth 2 (two) times daily. Fill on or after 06/10/17 06/03/17   Burchette, Elberta FortisBruce W, MD  amphetamine-dextroamphetamine (ADDERALL XR) 20 MG 24 hr capsule Take 1 capsule (20 mg total) by mouth every morning.  06/03/17   Burchette, Elberta Fortis, MD  amphetamine-dextroamphetamine (ADDERALL XR) 20 MG 24 hr capsule Take 1 capsule (20 mg total) by mouth daily. Fill on or after 08/10/17 06/03/17   Burchette, Elberta Fortis, MD  amphetamine-dextroamphetamine (ADDERALL XR) 25 MG 24 hr capsule Take 25 mg by mouth daily. 07/24/17   [provider]  amphetamine-dextroamphetamine (ADDERALL) 10 MG tablet Take One Tablet daily After Lunch Daily. 06/25/17   Burchette, Elberta Fortis, MD  amphetamine-dextroamphetamine (ADDERALL) 10 MG tablet Take one tablet daily after lunch - fill in one month 06/25/17   Burchette, Elberta Fortis, MD    amphetamine-dextroamphetamine (ADDERALL) 10 MG tablet Take one table daily after lunch - fill in two months 06/25/17   Burchette, Elberta Fortis, MD  Beclomethasone Diprop HFA (QVAR REDIHALER) 80 MCG/ACT AERB Inhale 80 mcg into the lungs 2 (two) times daily. 01/15/17   Burchette, Elberta Fortis, MD  cloNIDine (CATAPRES) 0.1 MG tablet Take 0.1 mg by mouth at bedtime. 06/09/17   [provider]  esomeprazole (NEXIUM) 40 MG capsule Take 40 mg by mouth daily at 12 noon.    [provider]  losartan-hydrochlorothiazide (HYZAAR) 100-12.5 MG tablet TAKE 1 TABLET BY MOUTH DAILY 12/03/16   Burchette, Elberta Fortis, MD  montelukast (SINGULAIR) 10 MG tablet Take 1 tablet (10 mg total) by mouth at bedtime. 05/10/16   Burchette, Elberta Fortis, MD  predniSONE (DELTASONE) 10 MG tablet Take 4 tablets (40 mg total) by mouth daily for 5 days. 10/09/17 10/14/17  [name], PA-C  sertraline (ZOLOFT) 100 MG tablet take 1 tablet by mouth once daily 10/04/16   Burchette, Elberta Fortis, MD  VENTOLIN HFA 108 (90 Base) MCG/ACT inhaler INHALE 2-3 PUFFS EVERY 6 HOURS IF NEEDED FOR WHEEZING OR SHORTNESS OF BREATH Patient not taking: Reported on 04/16/2017 01/13/17   Burchette, Elberta Fortis, MD  VENTOLIN HFA 108 (90 Base) MCG/ACT inhaler INHALE 2 TO 3 PUFFS INTO THE LUNGS BY MOUTH EVERY 6 HOURS AS NEEDED FOR WHEEZING OR SHORTNESS OF BREATH Patient not taking: Reported on 04/16/2017 04/07/17   Kristian Covey, MD  VENTOLIN HFA 108 [phone] Base) MCG/ACT inhaler inhale 2 to 3 puffs by mouth every 6 hours if needed for shortness of breath 08/04/17   Burchette, Elberta Fortis, MD    Family History Family History  Problem Relation Age of Onset  . Asthma Son   . Hypertension Other   . Crohn's disease Father   . Crohn's disease Maternal Aunt   . Esophageal cancer Maternal Uncle   . Breast cancer Maternal Grandmother     Social History Social History   Tobacco Use  . Smoking status: Never Smoker  . Smokeless tobacco: Never Used  Substance Use Topics  .  Alcohol use: Yes    Comment: several times monthly  . Drug use: No     Allergies   Aspirin; Codeine sulfate; and Hydrocodone-acetaminophen   Review of Systems Review of Systems  Constitutional: Negative for chills and fever.  HENT: Negative for congestion and sore throat.   Respiratory: Positive for cough, chest tightness, shortness of breath and wheezing. Negative for stridor.   Cardiovascular: Negative for chest pain, palpitations and leg swelling.     Physical Exam Updated Vital Signs BP (!) 159/91 (BP Location: Right Arm)   Pulse 87   Temp 98.1 F (36.7 C) (Oral)   Resp 18   Ht 5\' 6"  (1.676 m)   Wt 70.3 kg (155 lb)   LMP  (LMP Unknown)   SpO2  99%   BMI 25.02 kg/m   Physical Exam  Constitutional: She is oriented to person, place, and time. She appears well-developed and well-nourished. No distress.  HENT:  Head: Normocephalic and atraumatic.  Right Ear: Tympanic membrane, external ear and ear canal normal.  Left Ear: Tympanic membrane, external ear and ear canal normal.  Nose: Nose normal. Right sinus exhibits no maxillary sinus tenderness and no frontal sinus tenderness. Left sinus exhibits no maxillary sinus tenderness and no frontal sinus tenderness.  Mouth/Throat: Uvula is midline, oropharynx is clear and moist and mucous membranes are normal. No tonsillar exudate.  Eyes: Conjunctivae and EOM are normal. Pupils are equal, round, and reactive to light.  Neck: Normal range of motion.  Cardiovascular: Normal rate, regular rhythm and intact distal pulses.  Pulmonary/Chest: Effort normal. She has no decreased breath sounds. She has wheezes. She has no rhonchi. She has no rales.  Pt speaking in full sentences without difficulty.  Mild scattered wheezing.  Abdominal: Soft. She exhibits no distension. There is no tenderness.  Musculoskeletal: Normal range of motion.  Lymphadenopathy:    She has no cervical adenopathy.  Neurological: She is alert and oriented to  person, place, and time.  Skin: Skin is warm.  Psychiatric: She has a normal mood and affect.  Nursing note and vitals reviewed.    ED Treatments / Results  Labs (all labs ordered are listed, but only abnormal results are displayed) Labs Reviewed - No data to display  EKG  EKG Interpretation None       Radiology No results found.  Procedures Procedures (including critical care time)  Medications Ordered in ED Medications  albuterol (PROVENTIL) (2.5 MG/3ML) 0.083% nebulizer solution (2.5 mg  Given 10/09/17 1611)  ipratropium-albuterol (DUONEB) 0.5-2.5 (3) MG/3ML nebulizer solution (3 mLs  Given 10/09/17 1611)  albuterol (PROVENTIL HFA;VENTOLIN HFA) 108 (90 Base) MCG/ACT inhaler (2 puffs  Given 10/09/17 1626)     Initial Impression / Assessment and Plan / ED Course  I have reviewed the triage vital signs and the nursing notes.  Pertinent labs & imaging results that were available during my care of the patient were reviewed by me and considered in my medical decision making (see chart for details).     Patient presenting for continued cough and shortness of breath.  Physical exam reassuring, she is afebrile not tachycardic.  Initial exam showed mild wheezing, improved after butyryl treatment.  Patient appears nontoxic.  She does not want a chest x-ray today.  She is requesting refill of albuterol.  At this time, doubt PE or bacterial infection. Likely URI causing flare of asthma sxs.  Will give albuterol and prednisone for home use.  Patient to follow-up with primary care if symptoms are not improving.  Patient to return if symptoms worsen.  Discussed importance of preventing cough.  At this time, patient present for discharge.  Return precautions given.  Patient states she understands and agrees to plan.   Final Clinical Impressions(s) / ED Diagnoses   Final diagnoses:  Cough  Upper respiratory tract infection, unspecified type    ED Discharge Orders        Ordered     predniSONE (DELTASONE) 10 MG tablet  Daily     10/09/17 1640       Alveria ApleyCaccavale, Donnie Panik, PA-C 10/10/17 0040    Maia PlanLong, Joshua G, MD 10/10/17 1013

## 2017-10-09 NOTE — ED Triage Notes (Signed)
Pt c/o "asthma"-states she has had cough x 3 weeks-worse x today-used Qvar inhaler, albuterol inhaler PTA with no relief

## 2017-10-09 NOTE — ED Notes (Signed)
RT and PA in triage to assess/treat pt

## 2017-10-09 NOTE — ED Notes (Signed)
O2 Sat 96% on RA at registration.  HR 101.  Pt in NAD.

## 2017-10-09 NOTE — Discharge Instructions (Signed)
Take steroids daily for the next 5 days. Use cough syrup at night to help prevent coughing. You likely have a viral illness.  This should be treated symptomatically. Use Tylenol or ibuprofen as needed for fevers or body aches. Make sure you stay well-hydrated with water. Wash your hands frequently to prevent spread of infection. Follow-up with your primary care doctor in 1 week if your symptoms are not improving. Return to the emergency room if you develop chest pain, difficulty breathing, or any new or worsening symptoms.

## 2017-10-23 ENCOUNTER — Other Ambulatory Visit: Payer: Self-pay | Admitting: Physician Assistant

## 2017-10-23 ENCOUNTER — Other Ambulatory Visit: Payer: Self-pay | Admitting: Family Medicine

## 2017-10-23 MED ORDER — ALBUTEROL SULFATE HFA 108 (90 BASE) MCG/ACT IN AERS: 2.0000 | INHALATION_SPRAY | Freq: Four times a day (QID) | RESPIRATORY_TRACT | 0 refills | Status: DC | PRN

## 2017-10-23 NOTE — Telephone Encounter (Unsigned)
Copied from CRM [id number]. Topic: Quick Communication - Rx Refill/Question >> Oct 23, 2017 11:19 AM Crist InfanteHarrald, Kathy J wrote: Has the patient contacted their pharmacy? {yes  (pt requesting if you could please refill her albuterol (PROVENTIL HFA;VENTOLIN HFA) 108 (90 Base) MCG/ACT inhaler  Pt has made appt for 12/27, but states she really needs her inhaler.  Pt has had a rough time breathing.  Pt states she has had to work over, and [place], whic [place] it hard to come for appt.

## 2017-10-23 NOTE — Telephone Encounter (Signed)
I tried to reach pt, left a voice message Dr. Caryl NeverBurchette is out of the office today, will be back  tomorrow and will forward this message for review.

## 2017-10-23 NOTE — Telephone Encounter (Signed)
Last fill of inhaler was 11/23 at ED. Patient has appt 12/27. Not sure about RF.

## 2017-10-23 NOTE — Telephone Encounter (Signed)
Pt received a sample size inhaler proventil when she was at the inhaler.she has had bronchitis and has had to use inhaler more often.  cb number is [phone].  [facility] [address]

## 2017-10-23 NOTE — Telephone Encounter (Signed)
Review only

## 2017-10-23 NOTE — Telephone Encounter (Signed)
Patient called in requesting a refill of her proventil inhaler, patient stated she "has an appointment on 10/30/17 and if I don't get the refill, I will just go to the ED because I am becoming short of breath," appointment in chart was verified, proventil can be refilled per protocol, Dr. Caryl NeverBurchette sent a note to approve the refill request by another nurse, patient verbalized being in no acute distress at this time.

## 2017-10-23 NOTE — Telephone Encounter (Signed)
Pt does have upcoming appointment with Dr. Burchette, I sent script foCaryl Neverr [facility] and spoke with pt.

## 2017-10-30 ENCOUNTER — Ambulatory Visit: Payer: BLUE CROSS/BLUE SHIELD | Admitting: Family Medicine

## 2017-10-31 ENCOUNTER — Ambulatory Visit: Payer: BLUE CROSS/BLUE SHIELD | Admitting: Family Medicine

## 2017-11-03 ENCOUNTER — Telehealth: Payer: Self-pay | Admitting: *Deleted

## 2017-11-03 NOTE — Telephone Encounter (Signed)
Returned call to pharmacy. The last time we filled Adderall rx on Dec. 24 was in 2015. Pharmacist states rx looks very old and the last two digits of the year are cut off.  Per last phone note, patient needs appointment for further refills. She had appt scheduled on 10/31/17, but no showed.  Called patient and left message to return call to reschedule appointment.

## 2017-11-03 NOTE — Telephone Encounter (Signed)
Patient Name: Melton KrebsCHRISTINE Garczynski Gender: Female DOB: [birth year] Age: [age] Y 9 M 13 D Return Phone Number: [phone] (Primary) Address: City[place]State/Zip: [place] [id number] Client [facility] Night - Client Client Site [facility] - Night Physician Evelena PeatBurchette, Bruce - MD Contact Type Call Who Is Calling Pharmacy Call Type Pharmacy Send to RN Chief Complaint Prescription Refill or Medication Request (non symptomatic) Reason for Call Request to speak to Physician Initial Comment Caller states she is Asher MuirJamie with [facility] and patient information for a prescription for a controlled substance. Additional Comment [facility] Pharmacist Name Babson ParkJamie Pharmacy Number [phone] Translation No Nurse Assessment Nurse: Judith PartUrcheck, RN, Santina Evansatherine Date/Time Lamount Cohen(Eastern Time): 11/01/2017 1:16:42 PM Please select the assessment type ---Pharmacy clarification Additional Documentation ---adderall prescription is torn and the year is not shown on script-invalid. reads 20mg  date 10/28/19?Marland Kitchen. Pt is waiting at pharmacy to fill script. Pharmacist unsure if this can wait until Monday. Is there an on-call physician for the client? ---Yes Do the client directives allow paging the on call for medication concerns? ---Yes Document information that requires clarification. ---see previous Guidelines Guideline Title Affirmed Question Affirmed Notes Nurse Date/Time (Eastern Time) Disp. Time Lamount Cohen(Eastern Time) Disposition Final User 11/01/2017 1:20:48 PM [facility] Urcheck, RN, Santina EvansCatherine 11/01/2017 1:44:34 PM Pharmacy Call Judith PartUrcheck, RN, Santina Evansatherine Reason: spoke to pharmacist jamie 11/01/2017 1:44:44 PM Clinical Call Yes Judith PartUrcheck, RN, Santina Evansatherine PLEASE NOTE: All timestamps contained within this report are represented as [place] Standard Time. CONFIDENTIALTY NOTICE: This fax transmission is intended only for the addressee. It contains information that is  legally privileged, confidential or otherwise protected from use or disclosure. If you are not the intended recipient, you are strictly prohibited from reviewing, disclosing, copying using or disseminating any of this information or taking any action in reliance on or regarding this information. If you have received this fax in error, please notify us immediately by telephone so that we can arrange for its return to us. Phone: [phone], Toll-Free: [phone], Fax: [phone] Page: 2 of 2 Call Id: [id number] Comments User: Darnelle Goingatherine, Urcheck, RN Date/Time Lamount Cohen(Eastern Time): 11/01/2017 1:44:17 PM Pharmacist Asher MuirJamie notified of Dr advice Paging DoctorName Phone DateTime Result/Outcome Message Type Notes Roxy Mannsower, [place] [phone] 11/01/2017 1:20:48 PM Paged On Call Back to Call Center Doctor Paged Santina EvansCatherine RN [facility] [phone] medication/pharmacy clarification Roxy Mannsower, [place] - MD 11/01/2017 1:40:59 PM Spoke with On Call - General Message Result Dr is unable to verify a prescription from 10/27/2017 in epic. She advised pt call Dr office Monday

## 2017-11-09 ENCOUNTER — Encounter (HOSPITAL_COMMUNITY): Payer: Self-pay | Admitting: Emergency Medicine

## 2017-11-09 ENCOUNTER — Other Ambulatory Visit: Payer: Self-pay

## 2017-11-09 DIAGNOSIS — J45909 Unspecified asthma, uncomplicated: Secondary | ICD-10-CM | POA: Diagnosis not present

## 2017-11-09 DIAGNOSIS — Z5321 Procedure and treatment not carried out due to patient leaving prior to being seen by health care provider: Secondary | ICD-10-CM | POA: Insufficient documentation

## 2017-11-09 MED ORDER — IPRATROPIUM-ALBUTEROL 0.5-2.5 (3) MG/3ML IN SOLN
3.0000 mL | Freq: Once | RESPIRATORY_TRACT | Status: AC
  Administered 2017-11-09: 3 mL via RESPIRATORY_TRACT
  Filled 2017-11-09: qty 3

## 2017-11-09 NOTE — ED Triage Notes (Signed)
Pt c/o diff breathing for several days, MDI not helping at home.  insp and exp wheezing

## 2017-11-10 ENCOUNTER — Emergency Department (HOSPITAL_COMMUNITY)
Admission: EM | Admit: 2017-11-10 | Discharge: 2017-11-10 | Disposition: A | Discharge status: Home / Self Care | Payer: BLUE CROSS/BLUE SHIELD | Source: Home / Self Care

## 2017-11-10 ENCOUNTER — Other Ambulatory Visit: Payer: Self-pay | Admitting: Family Medicine

## 2017-11-10 ENCOUNTER — Encounter (HOSPITAL_COMMUNITY): Payer: Self-pay | Admitting: Emergency Medicine

## 2017-11-10 ENCOUNTER — Emergency Department (HOSPITAL_COMMUNITY)
Admission: EM | Admit: 2017-11-10 | Discharge: 2017-11-10 | Discharge status: Against Medical Advice | Payer: BLUE CROSS/BLUE SHIELD | Attending: Emergency Medicine | Admitting: Emergency Medicine

## 2017-11-10 ENCOUNTER — Ambulatory Visit (HOSPITAL_COMMUNITY)
Admission: EM | Admit: 2017-11-10 | Discharge: 2017-11-10 | Disposition: A | Discharge status: Home / Self Care | Payer: BLUE CROSS/BLUE SHIELD | Source: Home / Self Care | Attending: Family Medicine | Admitting: Family Medicine

## 2017-11-10 ENCOUNTER — Other Ambulatory Visit: Payer: Self-pay

## 2017-11-10 DIAGNOSIS — Z760 Encounter for issue of repeat prescription: Secondary | ICD-10-CM | POA: Diagnosis not present

## 2017-11-10 DIAGNOSIS — R062 Wheezing: Secondary | ICD-10-CM

## 2017-11-10 DIAGNOSIS — J4521 Mild intermittent asthma with (acute) exacerbation: Secondary | ICD-10-CM

## 2017-11-10 DIAGNOSIS — Z5321 Procedure and treatment not carried out due to patient leaving prior to being seen by health care provider: Secondary | ICD-10-CM | POA: Insufficient documentation

## 2017-11-10 MED ORDER — ALBUTEROL SULFATE HFA 108 (90 BASE) MCG/ACT IN AERS
2.0000 | INHALATION_SPRAY | Freq: Once | RESPIRATORY_TRACT | Status: AC
  Administered 2017-11-10: 2 via RESPIRATORY_TRACT

## 2017-11-10 MED ORDER — ALBUTEROL SULFATE (2.5 MG/3ML) 0.083% IN NEBU
5.0000 mg | INHALATION_SOLUTION | Freq: Once | RESPIRATORY_TRACT | Status: AC
  Administered 2017-11-10: 5 mg via RESPIRATORY_TRACT

## 2017-11-10 MED ORDER — ALBUTEROL SULFATE HFA 108 (90 BASE) MCG/ACT IN AERS
INHALATION_SPRAY | RESPIRATORY_TRACT | Status: AC
  Filled 2017-11-10: qty 6.7

## 2017-11-10 NOTE — Discharge Instructions (Signed)
No wheezing on exam.  Albuterol as needed for shortness of breath and wheezing.  Continue Qvar as directed.  Follow-up with PCP for further refills, evaluation and management needed. You can take overt the counter flonase, zyrtec-D for nasal congestion. You can use over the counter nasal saline rinse such as neti pot for nasal congestion. Keep hydrated, your urine should be clear to pale yellow in color. Tylenol/motrin for fever and pain. Monitor for any worsening of symptoms, chest pain, shortness of breath, wheezing, swelling of the throat, follow up for reevaluation.

## 2017-11-10 NOTE — ED Triage Notes (Signed)
Reports asthma has been acting up for 2 days.  See note from ed

## 2017-11-10 NOTE — ED Provider Notes (Signed)
[facility]    CSN: [id number] Arrival date & time: 11/10/17  1944     History   Chief Complaint Chief Complaint  Patient presents with  . Asthma    HPI [name] is a 52 y.o. female.   52 year old female with history of asthma, hepatitis A, HTN, depression, anxiety, comes in for medication refill due to possible asthma exacerbation.  Patient states she is recently been getting over a cold, and has felt increased wheezing.  She has run out of her albuterol and has not been able to use it.  She states she recently got her Qvar, and took it prior to arrival.  States she got anxious because she was not feeling better, but thinks Qvar has started to work, as she started to feel better in the exam room.  She denies any fever, chills, night sweats.  States she missed her appointment with PCP to refill albuterol.  She went to the emergency department for medication refill, but had to leave due to family obligations.  Denies chest pain, shortness of breath.  States wheezing has resolved now that Qvar is working.      Past Medical History:  Diagnosis Date  . Asthma   . Blood transfusion   . Depression   . GERD (gastroesophageal reflux disease)   . Hepatitis A   . Hypertension   . Jaundice   . Migraines   . Ovarian cyst   . UTI (urinary tract infection)     Patient Active Problem List   Diagnosis Date Noted  . Asthma exacerbation 08/04/2017  . Respiratory distress   . ALLERGIC RHINITIS 01/29/2010  . Attention deficit disorder 12/14/2009  . Anxiety state 10/23/2009  . Adjustment disorder with mixed anxiety and depressed mood 05/25/2009  . Hypertension 05/25/2009  . Asthma 05/25/2009  . GERD 05/25/2009    Past Surgical History:  Procedure Laterality Date  . APPENDECTOMY  1994  . OPEN REDUCTION INTERNAL FIXATION (ORIF) METACARPAL Right 08/15/2016   Procedure: OPEN REDUCTION INTERNAL FIXATION (ORIF) right ring finger middle phalanx fracture;  Surgeon:  Betha LoaKevin Kuzma, MD;  Location: [facility];  Service: Orthopedics;  Laterality: Right;  . OVARIAN CYST REMOVAL    . TONSILLECTOMY AND ADENOIDECTOMY      OB History    No data available       Home Medications    Prior to Admission medications   Medication Sig Start Date End Date Taking? Authorizing Provider  Beclomethasone Diprop HFA (QVAR REDIHALER) 80 MCG/ACT AERB Inhale 80 mcg into the lungs 2 (two) times daily. 01/15/17  Yes Burchette, Elberta FortisBruce W, MD  losartan-hydrochlorothiazide (HYZAAR) 100-12.5 MG tablet TAKE 1 TABLET BY MOUTH DAILY 12/03/16  Yes Burchette, Elberta FortisBruce W, MD  albuterol (VENTOLIN HFA) 108 (90 Base) MCG/ACT inhaler Inhale 2 puffs into the lungs every 6 (six) hours as needed for wheezing or shortness of breath. 10/23/17   Burchette, Elberta FortisBruce W, MD  amphetamine-dextroamphetamine (ADDERALL XR) 20 MG 24 hr capsule Take 1 capsule (20 mg total) by mouth daily. Fill on or after 06/10/17 Patient taking differently: Take 20 mg by mouth 2 (two) times daily. Fill on or after 06/10/17 06/03/17   Kristian CoveyBurchette, Bruce W, MD  amphetamine-dextroamphetamine (ADDERALL XR) 20 MG 24 hr capsule Take 1 capsule (20 mg total) by mouth every morning. 06/03/17   Burchette, Elberta FortisBruce W, MD  amphetamine-dextroamphetamine (ADDERALL XR) 20 MG 24 hr capsule Take 1 capsule (20 mg total) by mouth daily. Fill on or after 08/10/17  06/03/17   Burchette, Elberta Fortis, MD  amphetamine-dextroamphetamine (ADDERALL XR) 25 MG 24 hr capsule Take 25 mg by mouth daily. 07/24/17   [provider]  amphetamine-dextroamphetamine (ADDERALL) 10 MG tablet Take One Tablet daily After Lunch Daily. 06/25/17   Burchette, Elberta Fortis, MD  amphetamine-dextroamphetamine (ADDERALL) 10 MG tablet Take one tablet daily after lunch - fill in one month 06/25/17   Burchette, Elberta Fortis, MD  amphetamine-dextroamphetamine (ADDERALL) 10 MG tablet Take one table daily after lunch - fill in two months 06/25/17   Burchette, Elberta Fortis, MD  esomeprazole (NEXIUM) 40 MG  capsule Take 40 mg by mouth daily at 12 noon.    [provider]  sertraline (ZOLOFT) 100 MG tablet take 1 tablet by mouth once daily 10/04/16   Burchette, Elberta Fortis, MD    Family History Family History  Problem Relation Age of Onset  . Asthma Son   . Hypertension Other   . Crohn's disease Father   . Crohn's disease Maternal Aunt   . Esophageal cancer Maternal Uncle   . Breast cancer Maternal Grandmother     Social History Social History   Tobacco Use  . Smoking status: Never Smoker  . Smokeless tobacco: Never Used  Substance Use Topics  . Alcohol use: Yes    Comment: several times monthly  . Drug use: No     Allergies   Aspirin; Codeine sulfate; and Hydrocodone-acetaminophen   Review of Systems Review of Systems  Reason unable to perform ROS: See HPI as above.     Physical Exam Triage Vital Signs ED Triage Vitals [11/10/17 2027]  Enc Vitals Group     BP (!) 168/116     Pulse Rate 86     Resp (!) 22     Temp 97.7 F (36.5 C)     Temp Source Oral     SpO2 97 %     Weight      Height      Head Circumference      Peak Flow      Pain Score      Pain Loc      Pain Edu?      Excl. in GC?    No data found.  Updated Vital Signs BP (!) 168/116 (BP Location: Right Arm)   Pulse 86   Temp 97.7 F (36.5 C) (Oral)   Resp (!) 22   SpO2 97%   Physical Exam  Constitutional: She is oriented to person, place, and time. She appears well-developed and well-nourished. No distress.  HENT:  Head: Normocephalic and atraumatic.  Right Ear: Tympanic membrane, external ear and ear canal normal. Tympanic membrane is not erythematous and not bulging.  Left Ear: Tympanic membrane, external ear and ear canal normal. Tympanic membrane is not erythematous and not bulging.  Nose: Nose normal. Right sinus exhibits no maxillary sinus tenderness and no frontal sinus tenderness. Left sinus exhibits no maxillary sinus tenderness and no frontal sinus tenderness.  Mouth/Throat:  Uvula is midline, oropharynx is clear and moist and mucous membranes are normal.  Eyes: Conjunctivae are normal. Pupils are equal, round, and reactive to light.  Neck: Normal range of motion. Neck supple.  Cardiovascular: Normal rate, regular rhythm and normal heart sounds. Exam reveals no gallop and no friction rub.  No murmur heard. Pulmonary/Chest: Effort normal and breath sounds normal. She has no decreased breath sounds. She has no wheezes. She has no rhonchi. She has no rales.  Lymphadenopathy:    She  has no cervical adenopathy.  Neurological: She is alert and oriented to person, place, and time.  Skin: Skin is warm and dry.  Psychiatric: She has a normal mood and affect. Her behavior is normal. Judgment normal.     UC Treatments / Results  Labs (all labs ordered are listed, but only abnormal results are displayed) Labs Reviewed - No data to display  EKG  EKG Interpretation None       Radiology No results found.  Procedures Procedures (including critical care time)  Medications Ordered in UC Medications  albuterol (PROVENTIL HFA;VENTOLIN HFA) 108 (90 Base) MCG/ACT inhaler 2 puff (2 puffs Inhalation Given 11/10/17 2049)     Initial Impression / Assessment and Plan / UC Course  I have reviewed the triage vital signs and the nursing notes.  Pertinent labs & imaging results that were available during my care of the patient were reviewed by me and considered in my medical decision making (see chart for details).    Lungs clear to auscultation bilaterally without adventitious lung sounds.  Patient is afebrile, O2 sat of 97%, in no acute distress.  Reasonable to refill albuterol.  Patient to follow-up with PCP for further evaluation and treatment needed.  Return precautions given.  Patient expresses understanding and agrees to plan.  Final Clinical Impressions(s) / UC Diagnoses   Final diagnoses:  Mild intermittent asthma with acute exacerbation    ED Discharge Orders     None        Lurline Idol 11/10/17 2053

## 2017-11-10 NOTE — ED Triage Notes (Addendum)
Pt seen here last for asthma and does feel any better, pt still having wheezing. Out of her inhaler. Had to leave prior to receiving meds last night.

## 2017-11-10 NOTE — ED Notes (Signed)
Pt called, no answer, not seen in waiting room.

## 2017-12-11 ENCOUNTER — Other Ambulatory Visit: Payer: Self-pay | Admitting: Family Medicine

## 2018-02-17 ENCOUNTER — Ambulatory Visit: Payer: Self-pay | Admitting: Family Medicine

## 2018-07-28 DIAGNOSIS — Z79891 Long term (current) use of opiate analgesic: Secondary | ICD-10-CM | POA: Diagnosis not present

## 2018-08-10 ENCOUNTER — Encounter (HOSPITAL_COMMUNITY): Payer: Self-pay | Admitting: Emergency Medicine

## 2018-08-10 ENCOUNTER — Other Ambulatory Visit: Payer: Self-pay

## 2018-08-10 ENCOUNTER — Ambulatory Visit (HOSPITAL_COMMUNITY)
Admission: EM | Admit: 2018-08-10 | Discharge: 2018-08-10 | Disposition: A | Discharge status: Home / Self Care | Payer: 59 | Attending: Family Medicine | Admitting: Family Medicine

## 2018-08-10 DIAGNOSIS — J4521 Mild intermittent asthma with (acute) exacerbation: Secondary | ICD-10-CM | POA: Diagnosis not present

## 2018-08-10 MED ORDER — ALBUTEROL SULFATE HFA 108 (90 BASE) MCG/ACT IN AERS: 2.0000 | INHALATION_SPRAY | Freq: Four times a day (QID) | RESPIRATORY_TRACT | 0 refills | Status: DC | PRN

## 2018-08-10 MED ORDER — IPRATROPIUM-ALBUTEROL 0.5-2.5 (3) MG/3ML IN SOLN
RESPIRATORY_TRACT | Status: AC
  Filled 2018-08-10: qty 3

## 2018-08-10 MED ORDER — PREDNISONE 20 MG PO TABS: ORAL_TABLET | ORAL | 0 refills | Status: DC

## 2018-08-10 MED ORDER — BECLOMETHASONE DIPROP HFA 80 MCG/ACT IN AERB: 2.0000 | INHALATION_SPRAY | Freq: Two times a day (BID) | RESPIRATORY_TRACT | 0 refills | Status: DC

## 2018-08-10 MED ORDER — IPRATROPIUM-ALBUTEROL 0.5-2.5 (3) MG/3ML IN SOLN
3.0000 mL | Freq: Once | RESPIRATORY_TRACT | Status: AC
  Administered 2018-08-10: 3 mL via RESPIRATORY_TRACT

## 2018-08-10 MED ORDER — CETIRIZINE HCL 10 MG PO TABS: 10.0000 mg | ORAL_TABLET | Freq: Every day | ORAL | 0 refills | Status: DC

## 2018-08-10 NOTE — ED Provider Notes (Signed)
[facility]    CSN: [id number] Arrival date & time: 08/10/18  0801     History   Chief Complaint Chief Complaint  Patient presents with  . Cough    HPI AZUL COFFIE is a 52 y.o. female.   Demia presents today with complaints of asthma exacerbation. Cough and wheezing for the past two weeks. Out of her qvar due to recent lapse in health insurance related to new job. Uses albuterol which does help. Wheezing worse at night. Felt shortness of breath  Last night. No chest pain . Some congestion but minimal. Cough is non productive. No fevers. No sore throat or ear pain. No gi/gu complaints. Has not taken any other medications for symptoms. Son also with some URI. States has similar flair every October. Doesn't smoke. Hx of asthma, depression, hep a, htn, migraines, add.     ROS per HPI.      Past Medical History:  Diagnosis Date  . Asthma   . Blood transfusion   . Depression   . GERD (gastroesophageal reflux disease)   . Hepatitis A   . Hypertension   . Jaundice   . Migraines   . Ovarian cyst   . UTI (urinary tract infection)     Patient Active Problem List   Diagnosis Date Noted  . Asthma exacerbation 08/04/2017  . Respiratory distress   . ALLERGIC RHINITIS 01/29/2010  . Attention deficit disorder 12/14/2009  . Anxiety state 10/23/2009  . Adjustment disorder with mixed anxiety and depressed mood 05/25/2009  . Hypertension 05/25/2009  . Asthma 05/25/2009  . GERD 05/25/2009    Past Surgical History:  Procedure Laterality Date  . APPENDECTOMY  1994  . OPEN REDUCTION INTERNAL FIXATION (ORIF) METACARPAL Right 08/15/2016   Procedure: OPEN REDUCTION INTERNAL FIXATION (ORIF) right ring finger middle phalanx fracture;  Surgeon: Betha Loa, MD;  Location: [facility];  Service: Orthopedics;  Laterality: Right;  . OVARIAN CYST REMOVAL    . TONSILLECTOMY AND ADENOIDECTOMY      OB History   None      Home Medications     Prior to Admission medications   Medication Sig Start Date End Date Taking? Authorizing Provider  albuterol (VENTOLIN HFA) 108 (90 Base) MCG/ACT inhaler Inhale 2 puffs into the lungs every 6 (six) hours as needed for wheezing or shortness of breath. 08/10/18   Georgetta Haber, NP  amphetamine-dextroamphetamine (ADDERALL XR) 20 MG 24 hr capsule Take 1 capsule (20 mg total) by mouth daily. Fill on or after 06/10/17 06/03/17   Kristian Covey, MD  amphetamine-dextroamphetamine (ADDERALL XR) 20 MG 24 hr capsule Take 1 capsule (20 mg total) by mouth every morning. 06/03/17   Burchette, Elberta Fortis, MD  amphetamine-dextroamphetamine (ADDERALL XR) 20 MG 24 hr capsule Take 1 capsule (20 mg total) by mouth daily. Fill on or after 08/10/17 06/03/17   Burchette, Elberta Fortis, MD  amphetamine-dextroamphetamine (ADDERALL XR) 25 MG 24 hr capsule Take 25 mg by mouth daily. 07/24/17   [provider]  amphetamine-dextroamphetamine (ADDERALL) 10 MG tablet Take One Tablet daily After Lunch Daily. 06/25/17   Burchette, Elberta Fortis, MD  amphetamine-dextroamphetamine (ADDERALL) 10 MG tablet Take one tablet daily after lunch - fill in one month 06/25/17   Burchette, Elberta Fortis, MD  amphetamine-dextroamphetamine (ADDERALL) 10 MG tablet Take one table daily after lunch - fill in two months 06/25/17   Burchette, Elberta Fortis, MD  beclomethasone (QVAR REDIHALER) 80 MCG/ACT inhaler Inhale 2 puffs into the  lungs 2 (two) times daily. 08/10/18   Georgetta Haber, NP  cetirizine (ZYRTEC) 10 MG tablet Take 1 tablet (10 mg total) by mouth daily. 08/10/18   Georgetta Haber, NP  esomeprazole (NEXIUM) 40 MG capsule Take 40 mg by mouth daily at 12 noon.    [provider]  losartan-hydrochlorothiazide (HYZAAR) 100-12.5 MG tablet TAKE 1 TABLET BY MOUTH DAILY Patient not taking: Reported on 08/10/2018 12/03/16   Kristian Covey, MD  predniSONE (DELTASONE) 20 MG tablet 3-3-3-2-2-2-1-1-1 08/10/18   Linus Mako B, NP  sertraline (ZOLOFT) 100 MG  tablet take 1 tablet by mouth once daily Patient not taking: Reported on 08/10/2018 10/04/16   Kristian Covey, MD  ipratropium (ATROVENT) 0.02 % nebulizer solution Take 2.5 mLs (0.5 mg total) by nebulization 4 (four) times daily. Patient not taking: Reported on 01/15/2016 09/12/14 01/15/16  Arby Barrette, MD    Family History Family History  Problem Relation Age of Onset  . Asthma Son   . Hypertension Other   . Crohn's disease Father   . Crohn's disease Maternal Aunt   . Esophageal cancer Maternal Uncle   . Breast cancer Maternal Grandmother     Social History Social History   Tobacco Use  . Smoking status: Never Smoker  . Smokeless tobacco: Never Used  Substance Use Topics  . Alcohol use: Yes    Comment: several times monthly  . Drug use: No     Allergies   Aspirin; Codeine sulfate; and Hydrocodone-acetaminophen   Review of Systems Review of Systems   Physical Exam Triage Vital Signs ED Triage Vitals  Enc Vitals Group     BP 08/10/18 0816 (!) 162/94     Pulse Rate 08/10/18 0816 81     Resp 08/10/18 0816 18     Temp 08/10/18 0816 97.9 F (36.6 C)     Temp Source 08/10/18 0816 Oral     SpO2 08/10/18 0816 100 %     Weight --      Height --      Head Circumference --      Peak Flow --      Pain Score 08/10/18 0825 0     Pain Loc --      Pain Edu? --      Excl. in GC? --    No data found.  Updated Vital Signs BP (!) 162/94 (BP Location: Left Arm)   Pulse 81   Temp 97.9 F (36.6 C) (Oral)   Resp 18   SpO2 100%   Visual Acuity Right Eye Distance:   Left Eye Distance:   Bilateral Distance:    Right Eye Near:   Left Eye Near:    Bilateral Near:     Physical Exam  Constitutional: She is oriented to person, place, and time. She appears well-developed and well-nourished. No distress.  HENT:  Head: Normocephalic and atraumatic.  Right Ear: Tympanic membrane, external ear and ear canal normal.  Left Ear: Tympanic membrane, external ear and ear  canal normal.  Nose: Nose normal.  Mouth/Throat: Uvula is midline, oropharynx is clear and moist and mucous membranes are normal. No tonsillar exudate.  Eyes: Pupils are equal, round, and reactive to light. Conjunctivae and EOM are normal.  Cardiovascular: Normal rate, regular rhythm and normal heart sounds.  Pulmonary/Chest: Effort normal. She has wheezes in the right upper field and the right lower field.  Neurological: She is alert and oriented to person, place, and time.  Skin: Skin is  warm and dry.     UC Treatments / Results  Labs (all labs ordered are listed, but only abnormal results are displayed) Labs Reviewed - No data to display  EKG None  Radiology No results found.  Procedures Procedures (including critical care time)  Medications Ordered in UC Medications  ipratropium-albuterol (DUONEB) 0.5-2.5 (3) MG/3ML nebulizer solution 3 mL (has no administration in time range)    Initial Impression / Assessment and Plan / UC Course  I have reviewed the triage vital signs and the nursing notes.  Pertinent labs & imaging results that were available during my care of the patient were reviewed by me and considered in my medical decision making (see chart for details).     Some wheezing noted. No increased work of breathing. No fevers and afebrile here today. No tachycardia, tachypnea or hypoxia noted. Neb provided in clinic today. Refill on qvar provided. Prednisone taper provided. Return precautions provided. If symptoms worsen or do not improve in the next week to return to be seen or to follow up with PCP.  Patient verbalized understanding and agreeable to plan.   Final Clinical Impressions(s) / UC Diagnoses   Final diagnoses:  Mild intermittent asthma with exacerbation     Discharge Instructions     Zyrtec and qvar daily.  Albuterol as needed.  Prednisone taper.  If symptoms worsen or do not improve in the next week to return to be seen or to follow up with your  PCP.     ED Prescriptions    Medication Sig Dispense Auth. Provider   beclomethasone (QVAR REDIHALER) 80 MCG/ACT inhaler Inhale 2 puffs into the lungs 2 (two) times daily. 10.6 g Linus Mako B, NP   albuterol (VENTOLIN HFA) 108 (90 Base) MCG/ACT inhaler Inhale 2 puffs into the lungs every 6 (six) hours as needed for wheezing or shortness of breath. 18 g Linus Mako B, NP   predniSONE (DELTASONE) 20 MG tablet 3-3-3-2-2-2-1-1-1 18 tablet Linus Mako B, NP   cetirizine (ZYRTEC) 10 MG tablet Take 1 tablet (10 mg total) by mouth daily. 30 tablet Georgetta Haber, NP     Controlled Substance Prescriptions [place] Controlled Substance Registry consulted? Not Applicable   Georgetta Haber, NP 08/10/18 [phone]

## 2018-08-10 NOTE — Discharge Instructions (Signed)
Zyrtec and qvar daily.  Albuterol as needed.  Prednisone taper.  If symptoms worsen or do not improve in the next week to return to be seen or to follow up with your PCP.

## 2018-08-10 NOTE — ED Triage Notes (Signed)
Onset 2 weeks ago of a cough, cough has worsened.  Patient reports wheezing.  Patient feels she has difficulty catching breath.  Has a slight runny nose todaydenies sore throat or ear pain

## 2018-08-10 NOTE — ED Notes (Signed)
Patient not ready for discharge-patient is scheduled for a breathing treatment

## 2018-08-14 ENCOUNTER — Emergency Department (HOSPITAL_COMMUNITY): Payer: 59

## 2018-08-14 ENCOUNTER — Emergency Department (HOSPITAL_COMMUNITY)
Admission: EM | Admit: 2018-08-14 | Discharge: 2018-08-14 | Disposition: A | Discharge status: Home / Self Care | Payer: 59 | Attending: Emergency Medicine | Admitting: Emergency Medicine

## 2018-08-14 ENCOUNTER — Other Ambulatory Visit: Payer: Self-pay

## 2018-08-14 ENCOUNTER — Encounter (HOSPITAL_COMMUNITY): Payer: Self-pay | Admitting: *Deleted

## 2018-08-14 DIAGNOSIS — J4521 Mild intermittent asthma with (acute) exacerbation: Secondary | ICD-10-CM | POA: Insufficient documentation

## 2018-08-14 DIAGNOSIS — Z79899 Other long term (current) drug therapy: Secondary | ICD-10-CM | POA: Insufficient documentation

## 2018-08-14 DIAGNOSIS — I10 Essential (primary) hypertension: Secondary | ICD-10-CM | POA: Diagnosis not present

## 2018-08-14 DIAGNOSIS — F909 Attention-deficit hyperactivity disorder, unspecified type: Secondary | ICD-10-CM | POA: Insufficient documentation

## 2018-08-14 DIAGNOSIS — F329 Major depressive disorder, single episode, unspecified: Secondary | ICD-10-CM | POA: Diagnosis not present

## 2018-08-14 DIAGNOSIS — R0602 Shortness of breath: Secondary | ICD-10-CM | POA: Diagnosis not present

## 2018-08-14 DIAGNOSIS — F4323 Adjustment disorder with mixed anxiety and depressed mood: Secondary | ICD-10-CM | POA: Insufficient documentation

## 2018-08-14 DIAGNOSIS — R062 Wheezing: Secondary | ICD-10-CM | POA: Diagnosis not present

## 2018-08-14 MED ORDER — ALBUTEROL SULFATE (2.5 MG/3ML) 0.083% IN NEBU: 5.0000 mg | INHALATION_SOLUTION | Freq: Once | RESPIRATORY_TRACT | Status: DC

## 2018-08-14 NOTE — ED Notes (Signed)
Bed: ZO10 Expected date:  Expected time:  Means of arrival:  Comments: 52 yo SOB

## 2018-08-14 NOTE — ED Notes (Signed)
Pt ambulating without noted distress or SHOB

## 2018-08-14 NOTE — Discharge Instructions (Signed)
Continue taking the prednisone that was prescribed to you as directed. Take Qvar daily.  Use albuterol as needed for wheezing. Return to ED for worsening symptoms, vomiting or coughing up blood, chest pain, worsening shortness of breath or wheezing.

## 2018-08-14 NOTE — ED Provider Notes (Signed)
[name]-EMERGENCY DEPT Provider Note   CSN: [id number] Arrival date & time: 08/14/18  7829     History   Chief Complaint Chief Complaint  Patient presents with  . Shortness of Breath    HPI [name] is a 52 y.o. female with a past medical history of hypertension, asthma presents to ED for evaluation of shortness of breath, cough and wheezing that began several hours prior to arrival.  Patient endorses dry cough, URI symptoms for the past week.  She was seen and evaluated by her PCP 5 days ago and was given a prednisone Dosepak, Qvar and albuterol inhaler.  She has been using this as directed.  However, states that symptoms suddenly worsen just prior to arrival.  No sick contacts with similar symptoms.  Reports significant improvement with neb treatments given by EMS.  She reports compliance with the prednisone prescribed by her PCP.  States that she gets similar asthma flareups during this time of the year.  Denies any hemoptysis, chest pain, fever, abdominal pain, vomiting, leg swelling.  History of PE, MI, recent surgeries, recent prolonged travel.  HPI  Past Medical History:  Diagnosis Date  . Asthma   . Blood transfusion   . Depression   . GERD (gastroesophageal reflux disease)   . Hepatitis A   . Hypertension   . Jaundice   . Migraines   . Ovarian cyst   . UTI (urinary tract infection)     Patient Active Problem List   Diagnosis Date Noted  . Asthma exacerbation 08/04/2017  . Respiratory distress   . ALLERGIC RHINITIS 01/29/2010  . Attention deficit disorder 12/14/2009  . Anxiety state 10/23/2009  . Adjustment disorder with mixed anxiety and depressed mood 05/25/2009  . Hypertension 05/25/2009  . Asthma 05/25/2009  . GERD 05/25/2009    Past Surgical History:  Procedure Laterality Date  . APPENDECTOMY  1994  . OPEN REDUCTION INTERNAL FIXATION (ORIF) METACARPAL Right 08/15/2016   Procedure: OPEN REDUCTION INTERNAL FIXATION  (ORIF) right ring finger middle phalanx fracture;  Surgeon: Betha Loa, MD;  Location: [facility];  Service: Orthopedics;  Laterality: Right;  . OVARIAN CYST REMOVAL    . TONSILLECTOMY AND ADENOIDECTOMY       OB History   None      Home Medications    Prior to Admission medications   Medication Sig Start Date End Date Taking? Authorizing Provider  albuterol (VENTOLIN HFA) 108 (90 Base) MCG/ACT inhaler Inhale 2 puffs into the lungs every 6 (six) hours as needed for wheezing or shortness of breath. 08/10/18   Georgetta Haber, NP  amphetamine-dextroamphetamine (ADDERALL XR) 20 MG 24 hr capsule Take 1 capsule (20 mg total) by mouth daily. Fill on or after 06/10/17 06/03/17   Kristian Covey, MD  amphetamine-dextroamphetamine (ADDERALL XR) 20 MG 24 hr capsule Take 1 capsule (20 mg total) by mouth every morning. 06/03/17   Burchette, Elberta Fortis, MD  amphetamine-dextroamphetamine (ADDERALL XR) 20 MG 24 hr capsule Take 1 capsule (20 mg total) by mouth daily. Fill on or after 08/10/17 06/03/17   Burchette, Elberta Fortis, MD  amphetamine-dextroamphetamine (ADDERALL XR) 25 MG 24 hr capsule Take 25 mg by mouth daily. 07/24/17   [provider]  amphetamine-dextroamphetamine (ADDERALL) 10 MG tablet Take One Tablet daily After Lunch Daily. 06/25/17   Burchette, Elberta Fortis, MD  amphetamine-dextroamphetamine (ADDERALL) 10 MG tablet Take one tablet daily after lunch - fill in one month 06/25/17   Evelena Peat  W, MD  amphetamine-dextroamphetamine (ADDERALL) 10 MG tablet Take one table daily after lunch - fill in two months 06/25/17   Burchette, Elberta Fortis, MD  beclomethasone (QVAR REDIHALER) 80 MCG/ACT inhaler Inhale 2 puffs into the lungs 2 (two) times daily. 08/10/18   Georgetta Haber, NP  cetirizine (ZYRTEC) 10 MG tablet Take 1 tablet (10 mg total) by mouth daily. 08/10/18   Georgetta Haber, NP  esomeprazole (NEXIUM) 40 MG capsule Take 40 mg by mouth daily at 12 noon.    [provider]    losartan-hydrochlorothiazide (HYZAAR) 100-12.5 MG tablet TAKE 1 TABLET BY MOUTH DAILY Patient not taking: Reported on 08/10/2018 12/03/16   Kristian Covey, MD  predniSONE (DELTASONE) 20 MG tablet 3-3-3-2-2-2-1-1-1 08/10/18   Linus Mako B, NP  sertraline (ZOLOFT) 100 MG tablet take 1 tablet by mouth once daily Patient not taking: Reported on 08/10/2018 10/04/16   Kristian Covey, MD    Family History Family History  Problem Relation Age of Onset  . Asthma Son   . Hypertension Other   . Crohn's disease Father   . Crohn's disease Maternal Aunt   . Esophageal cancer Maternal Uncle   . Breast cancer Maternal Grandmother     Social History Social History   Tobacco Use  . Smoking status: Never Smoker  . Smokeless tobacco: Never Used  Substance Use Topics  . Alcohol use: Yes    Comment: several times monthly  . Drug use: No     Allergies   Aspirin; Codeine sulfate; and Hydrocodone-acetaminophen   Review of Systems Review of Systems  Constitutional: Negative for appetite change, chills and fever.  HENT: Positive for congestion. Negative for ear pain, rhinorrhea, sneezing and sore throat.   Eyes: Negative for photophobia and visual disturbance.  Respiratory: Positive for cough, shortness of breath and wheezing. Negative for chest tightness.   Cardiovascular: Negative for chest pain and palpitations.  Gastrointestinal: Negative for abdominal pain, blood in stool, constipation, diarrhea, nausea and vomiting.  Genitourinary: Negative for dysuria, hematuria and urgency.  Musculoskeletal: Negative for myalgias.  Skin: Negative for rash.  Neurological: Negative for dizziness, weakness and light-headedness.     Physical Exam Updated Vital Signs BP (!) 176/104 (BP Location: Right Arm)   Pulse 70   Temp 98.3 F (36.8 C) (Oral)   Resp 20   Ht 5\' 6"  (1.676 m)   Wt 68 kg   SpO2 98%   BMI 24.21 kg/m   Physical Exam  Constitutional: She appears well-developed and  well-nourished. No distress.  HENT:  Head: Normocephalic and atraumatic.  Nose: Nose normal.  Eyes: Conjunctivae and EOM are normal. Left eye exhibits no discharge. No scleral icterus.  Neck: Normal range of motion. Neck supple.  Cardiovascular: Normal rate, regular rhythm, normal heart sounds and intact distal pulses. Exam reveals no gallop and no friction rub.  No murmur heard. Pulmonary/Chest: Effort normal. No respiratory distress. She has wheezes (faint) in the right middle field, the right lower field, the left middle field and the left lower field.  Abdominal: Soft. Bowel sounds are normal. She exhibits no distension. There is no tenderness. There is no guarding.  Musculoskeletal: Normal range of motion. She exhibits no edema.  Neurological: She is alert. She exhibits normal muscle tone. Coordination normal.  Skin: Skin is warm and dry. No rash noted.  Psychiatric: She has a normal mood and affect.  Nursing note and vitals reviewed.    ED Treatments / Results  Labs (all labs  ordered are listed, but only abnormal results are displayed) Labs Reviewed - No data to display  EKG None  Radiology Dg Chest 2 View  Result Date: 08/14/2018 CLINICAL DATA:  Shortness of breath. EXAM: CHEST - 2 VIEW COMPARISON:  August 04, 2017 FINDINGS: There is no edema or consolidation. The heart size and pulmonary vascularity are normal. No adenopathy. No pneumothorax. No bone lesions. IMPRESSION: No edema or consolidation. Electronically Signed   By: Bretta Bang III M.D.   On: 08/14/2018 08:27    Procedures Procedures (including critical care time)  Medications Ordered in ED Medications - No data to display   Initial Impression / Assessment and Plan / ED Course  I have reviewed the triage vital signs and the nursing notes.  Pertinent labs & imaging results that were available during my care of the patient were reviewed by me and considered in my medical decision making (see chart for  details).  Clinical Course as of Aug 14 844  Fri Aug 14, 2018  1610 Patient reports improvement with nebulizer treatments given by EMS.  She declines additional nebulizer treatment for now.   [HK]  [id number] Ambulatory with normal WOB.   [HK]    Clinical Course User Index [HK] Dietrich Pates, PA-C    52 year old female with past medical history of hypertension and asthma presents to ED for evaluation of shortness of breath, cough and wheezing that began several hours prior to arrival.  Seen and evaluated at PCPs office 5 days ago (however chart review shows urgent care visit on 10/7) and was given prednisone, Qvar and albuterol.  She has been using these as instructed.  States that the cough, shortness of breath and wheezing got worse prior to arrival.  Reports significant improvement in her symptoms with nebulizer treatment given by EMS which include 1 DuoNeb and one albuterol.  She was also given IV Solu-Medrol.  Patient declines further breathing treatment after arrival in the ED.  Chest x-ray shows no concerning findings. EKG with no ischemic changes.  Patient remains in NAD, normal vital signs.  She is not tachycardic, tachypneic or hypoxic.  She is afebrile with no history of fever.  She ambulates here without any difficulty.  Will advise her to continue steroids (she is about halfway through the course), inhalers as prescribed earlier in the week.  Suspect asthma exacerbation in setting of URI.  Denies any chest pain that could concern me for cardiac etiology or PE. No respiratory distress noted. Advised to return to ED for any severe worsening symptoms.  Portions of this note were generated with Scientist, clinical (histocompatibility and immunogenetics). Dictation errors may occur despite best attempts at proofreading.  Final Clinical Impressions(s) / ED Diagnoses   Final diagnoses:  Mild intermittent asthma with exacerbation    ED Discharge Orders    None       Dietrich Pates, PA-C 08/14/18 0846    Gerhard Munch,  MD 08/14/18 0930

## 2018-08-14 NOTE — ED Triage Notes (Signed)
EMS reports difficulty breathing, recent dx of Asthma, Presently taking Prednisone and Albuterol inhaler. One albuterol and a duo neb. # 20 Left hand Soul u medrol 125mg  given. 98%-160/87-90-18

## 2018-10-12 DIAGNOSIS — R0902 Hypoxemia: Secondary | ICD-10-CM | POA: Diagnosis not present

## 2018-10-12 DIAGNOSIS — I10 Essential (primary) hypertension: Secondary | ICD-10-CM | POA: Diagnosis not present

## 2018-10-14 ENCOUNTER — Encounter (HOSPITAL_COMMUNITY): Payer: Self-pay | Admitting: Emergency Medicine

## 2018-10-14 ENCOUNTER — Emergency Department (HOSPITAL_COMMUNITY): Payer: 59

## 2018-10-14 ENCOUNTER — Other Ambulatory Visit: Payer: Self-pay

## 2018-10-14 ENCOUNTER — Emergency Department (HOSPITAL_COMMUNITY)
Admission: EM | Admit: 2018-10-14 | Discharge: 2018-10-14 | Disposition: A | Discharge status: Home / Self Care | Payer: 59 | Attending: Emergency Medicine | Admitting: Emergency Medicine

## 2018-10-14 DIAGNOSIS — Z79899 Other long term (current) drug therapy: Secondary | ICD-10-CM | POA: Insufficient documentation

## 2018-10-14 DIAGNOSIS — I10 Essential (primary) hypertension: Secondary | ICD-10-CM | POA: Diagnosis not present

## 2018-10-14 DIAGNOSIS — J45909 Unspecified asthma, uncomplicated: Secondary | ICD-10-CM | POA: Diagnosis not present

## 2018-10-14 DIAGNOSIS — R1013 Epigastric pain: Secondary | ICD-10-CM

## 2018-10-14 DIAGNOSIS — K529 Noninfective gastroenteritis and colitis, unspecified: Secondary | ICD-10-CM

## 2018-10-14 DIAGNOSIS — K449 Diaphragmatic hernia without obstruction or gangrene: Secondary | ICD-10-CM | POA: Diagnosis not present

## 2018-10-14 DIAGNOSIS — R1031 Right lower quadrant pain: Secondary | ICD-10-CM | POA: Diagnosis not present

## 2018-10-14 DIAGNOSIS — R109 Unspecified abdominal pain: Secondary | ICD-10-CM | POA: Diagnosis not present

## 2018-10-14 LAB — CBC
HCT: 45.7 % (ref 36.0–46.0)
Hemoglobin: 14.4 g/dL (ref 12.0–15.0)
MCH: 26.5 pg (ref 26.0–34.0)
MCHC: 31.5 g/dL (ref 30.0–36.0)
MCV: 84 fL (ref 80.0–100.0)
Platelets: 399 10*3/uL (ref 150–400)
RBC: 5.44 MIL/uL — ABNORMAL HIGH (ref 3.87–5.11)
RDW: 15.4 % (ref 11.5–15.5)
WBC: 19.6 10*3/uL — ABNORMAL HIGH (ref 4.0–10.5)
nRBC: 0 % (ref 0.0–0.2)

## 2018-10-14 LAB — I-STAT BETA HCG BLOOD, ED (MC, WL, AP ONLY): I-stat hCG, quantitative: <5 m[IU]/mL (ref <5)

## 2018-10-14 LAB — URINALYSIS, ROUTINE W REFLEX MICROSCOPIC
Bilirubin Urine: NEGATIVE
Glucose, UA: NEGATIVE mg/dL
Hgb urine dipstick: NEGATIVE
Ketones, ur: NEGATIVE mg/dL
Leukocytes, UA: NEGATIVE
Nitrite: NEGATIVE
Protein, ur: NEGATIVE mg/dL
Specific Gravity, Urine: 1.03 (ref 1.005–1.030)
pH: 5 (ref 5.0–8.0)

## 2018-10-14 LAB — COMPREHENSIVE METABOLIC PANEL
ALT: 15 U/L (ref 0–44)
AST: 16 U/L (ref 15–41)
Albumin: 3.6 g/dL (ref 3.5–5.0)
Alkaline Phosphatase: 90 U/L (ref 38–126)
Anion gap: 10 (ref 5–15)
BUN: 24 mg/dL — ABNORMAL HIGH (ref 6–20)
CO2: 24 mmol/L (ref 22–32)
Calcium: 8.9 mg/dL (ref 8.9–10.3)
Chloride: 104 mmol/L (ref 98–111)
Creatinine, Ser: 0.88 mg/dL (ref 0.44–1.00)
GFR calc Af Amer: >60 mL/min (ref >60)
GFR calc non Af Amer: >60 mL/min (ref >60)
Glucose, Bld: 113 mg/dL — ABNORMAL HIGH (ref 70–99)
Potassium: 3.9 mmol/L (ref 3.5–5.1)
Sodium: 138 mmol/L (ref 135–145)
Total Bilirubin: 0.6 mg/dL (ref 0.3–1.2)
Total Protein: 7.1 g/dL (ref 6.5–8.1)

## 2018-10-14 LAB — LIPASE, BLOOD: Lipase: 31 U/L (ref 11–51)

## 2018-10-14 MED ORDER — FAMOTIDINE IN NACL 20-0.9 MG/50ML-% IV SOLN
20.0000 mg | Freq: Once | INTRAVENOUS | Status: AC
  Administered 2018-10-14: 20 mg via INTRAVENOUS
  Filled 2018-10-14: qty 50

## 2018-10-14 MED ORDER — MORPHINE SULFATE (PF) 4 MG/ML IV SOLN
6.0000 mg | Freq: Once | INTRAVENOUS | Status: AC
  Administered 2018-10-14: 6 mg via INTRAVENOUS
  Filled 2018-10-14: qty 2

## 2018-10-14 MED ORDER — SODIUM CHLORIDE 0.9 % IV BOLUS
1000.0000 mL | Freq: Once | INTRAVENOUS | Status: AC
  Administered 2018-10-14: 1000 mL via INTRAVENOUS

## 2018-10-14 MED ORDER — HYDROCHLOROTHIAZIDE 25 MG PO TABS
12.5000 mg | ORAL_TABLET | Freq: Once | ORAL | Status: AC
  Administered 2018-10-14: 12.5 mg via ORAL
  Filled 2018-10-14: qty 1

## 2018-10-14 MED ORDER — SUCRALFATE 1 GM/10ML PO SUSP: 1.0000 g | Freq: Three times a day (TID) | ORAL | 0 refills | Status: DC

## 2018-10-14 MED ORDER — LOSARTAN POTASSIUM 50 MG PO TABS
100.0000 mg | ORAL_TABLET | Freq: Once | ORAL | Status: AC
  Administered 2018-10-14: 100 mg via ORAL
  Filled 2018-10-14: qty 2

## 2018-10-14 MED ORDER — IOPAMIDOL (ISOVUE-300) INJECTION 61%
100.0000 mL | Freq: Once | INTRAVENOUS | Status: AC | PRN
  Administered 2018-10-14: 100 mL via INTRAVENOUS

## 2018-10-14 MED ORDER — FAMOTIDINE 20 MG PO TABS: 20.0000 mg | ORAL_TABLET | Freq: Two times a day (BID) | ORAL | 0 refills | Status: DC

## 2018-10-14 MED ORDER — ONDANSETRON 4 MG PO TBDP: 4.0000 mg | ORAL_TABLET | Freq: Three times a day (TID) | ORAL | 0 refills | Status: DC | PRN

## 2018-10-14 MED ORDER — ONDANSETRON 4 MG PO TBDP
4.0000 mg | ORAL_TABLET | Freq: Once | ORAL | Status: AC | PRN
  Administered 2018-10-14: 4 mg via ORAL
  Filled 2018-10-14: qty 1

## 2018-10-14 MED ORDER — MORPHINE SULFATE (PF) 4 MG/ML IV SOLN
4.0000 mg | Freq: Once | INTRAVENOUS | Status: AC
  Administered 2018-10-14: 4 mg via INTRAVENOUS
  Filled 2018-10-14: qty 1

## 2018-10-14 NOTE — ED Notes (Signed)
Patient verbalized understanding of discharge instructions and denies any further needs or questions at this time. VS stable. Patient ambulatory with steady gait.  

## 2018-10-14 NOTE — ED Provider Notes (Addendum)
[facility] Provider Note   CSN: [id number] Arrival date & time: 10/14/18  0449     History   Chief Complaint Chief Complaint  Patient presents with  . Abdominal Pain    HPI [name] is a 52 y.o. female with history of asthma, GERD, hypertension, ovarian cyst is here for evaluation of abdominal pain.  Onset Monday.  It comes in waves but is worsening.  It is constantly mild 2/10 but sometimes can be 10/10.  It is located in epigastrium.  She cannot describe how it feels.  Eating makes it worse.  States her acid reflux has been bad lately, frequently vomits in the middle of the night. She is on nexium and compliant without benefit. No formal GI eval.  Reports recent cold symptoms including cough, runny nose, yellow phlegm since Monday as well that she attributes to an asthma/cold flare.  She has intermittent nausea and nbnb emesis x 1 yesterday. Has felt intermittent tingling to entire right hand as well but states she is on the computer a lot.  She called paramedics on Monday for her asthma flare and she was giving Solu-Medrol IV.  Cold symptoms are improving but abdominal pain has persisted.  No interventions OTC.  S/p ovarian cyst removal and appendectomy. No h/o pancreatitis, gallstones, kidney stones. Gastritis. Does not use NSAID, aspirin, ETOH on a regular basis.   No associated fever, chills, CP, SOB, hematemesis, melena, changes to BM, hematuria, dysuria. She has h/o HTN but has not taken her BP meds in 4 days.  HPI  Past Medical History:  Diagnosis Date  . Asthma   . Blood transfusion   . Depression   . GERD (gastroesophageal reflux disease)   . Hepatitis A   . Hypertension   . Jaundice   . Migraines   . Ovarian cyst   . UTI (urinary tract infection)     Patient Active Problem List   Diagnosis Date Noted  . Asthma exacerbation 08/04/2017  . Respiratory distress   . ALLERGIC RHINITIS 01/29/2010  . Attention deficit  disorder 12/14/2009  . Anxiety state 10/23/2009  . Adjustment disorder with mixed anxiety and depressed mood 05/25/2009  . Hypertension 05/25/2009  . Asthma 05/25/2009  . GERD 05/25/2009    Past Surgical History:  Procedure Laterality Date  . APPENDECTOMY  1994  . OPEN REDUCTION INTERNAL FIXATION (ORIF) METACARPAL Right 08/15/2016   Procedure: OPEN REDUCTION INTERNAL FIXATION (ORIF) right ring finger middle phalanx fracture;  Surgeon: Betha LoaKevin Kuzma, MD;  Location: [facility];  Service: Orthopedics;  Laterality: Right;  . OVARIAN CYST REMOVAL    . TONSILLECTOMY AND ADENOIDECTOMY       OB History   None      Home Medications    Prior to Admission medications   Medication Sig Start Date End Date Taking? Authorizing Provider  albuterol (VENTOLIN HFA) 108 (90 Base) MCG/ACT inhaler Inhale 2 puffs into the lungs every 6 (six) hours as needed for wheezing or shortness of breath. 08/10/18  Yes Burky, Dorene GrebeNatalie B, NP  amphetamine-dextroamphetamine (ADDERALL XR) 25 MG 24 hr capsule Take 25 mg by mouth daily. 07/24/17  Yes [provider]  beclomethasone (QVAR REDIHALER) 80 MCG/ACT inhaler Inhale 2 puffs into the lungs 2 (two) times daily. 08/10/18  Yes Burky, Dorene GrebeNatalie B, NP  esomeprazole (NEXIUM) 40 MG capsule Take 40 mg by mouth daily at 12 noon.   Yes [provider]  FLUoxetine (PROZAC) 40 MG capsule Take  40 mg by mouth daily. 10/05/18  Yes [provider]  losartan-hydrochlorothiazide (HYZAAR) 100-12.5 MG tablet TAKE 1 TABLET BY MOUTH DAILY 12/03/16  Yes Burchette, Elberta Fortis, MD  naproxen sodium (ALEVE) 220 MG tablet Take 440 mg by mouth as needed (pain).   Yes [provider]  amphetamine-dextroamphetamine (ADDERALL XR) 20 MG 24 hr capsule Take 1 capsule (20 mg total) by mouth daily. Fill on or after 06/10/17 Patient not taking: Reported on 10/14/2018 06/03/17   Kristian Covey, MD  amphetamine-dextroamphetamine (ADDERALL XR) 20 MG 24 hr capsule  Take 1 capsule (20 mg total) by mouth every morning. Patient not taking: Reported on 10/14/2018 06/03/17   Kristian Covey, MD  amphetamine-dextroamphetamine (ADDERALL XR) 20 MG 24 hr capsule Take 1 capsule (20 mg total) by mouth daily. Fill on or after 08/10/17 Patient not taking: Reported on 10/14/2018 06/03/17   Kristian Covey, MD  amphetamine-dextroamphetamine (ADDERALL) 10 MG tablet Take One Tablet daily After Lunch Daily. Patient not taking: Reported on 10/14/2018 06/25/17   Kristian Covey, MD  amphetamine-dextroamphetamine (ADDERALL) 10 MG tablet Take one tablet daily after lunch - fill in one month Patient not taking: Reported on 10/14/2018 06/25/17   Kristian Covey, MD  amphetamine-dextroamphetamine (ADDERALL) 10 MG tablet Take one table daily after lunch - fill in two months Patient not taking: Reported on 10/14/2018 06/25/17   Kristian Covey, MD  cetirizine (ZYRTEC) 10 MG tablet Take 1 tablet (10 mg total) by mouth daily. Patient not taking: Reported on 10/14/2018 08/10/18   Linus Mako B, NP  famotidine (PEPCID) 20 MG tablet Take 1 tablet (20 mg total) by mouth 2 (two) times daily. 10/14/18   Liberty Handy, PA-C  ondansetron (ZOFRAN ODT) 4 MG disintegrating tablet Take 1 tablet (4 mg total) by mouth every 8 (eight) hours as needed for nausea or vomiting. 10/14/18   Liberty Handy, PA-C  predniSONE (DELTASONE) 20 MG tablet 3-3-3-2-2-2-1-1-1 Patient not taking: Reported on 10/14/2018 08/10/18   Linus Mako B, NP  sertraline (ZOLOFT) 100 MG tablet take 1 tablet by mouth once daily Patient not taking: Reported on 08/10/2018 10/04/16   Kristian Covey, MD  sucralfate (CARAFATE) 1 GM/10ML suspension Take 10 mLs (1 g total) by mouth 4 (four) times daily -  with meals and at bedtime. 10/14/18   Liberty Handy, PA-C    Family History Family History  Problem Relation Age of Onset  . Asthma Son   . Hypertension Other   . Crohn's disease Father   . Crohn's  disease Maternal Aunt   . Esophageal cancer Maternal Uncle   . Breast cancer Maternal Grandmother     Social History Social History   Tobacco Use  . Smoking status: Never Smoker  . Smokeless tobacco: Never Used  Substance Use Topics  . Alcohol use: Yes    Comment: several times monthly  . Drug use: No     Allergies   Aspirin; Codeine sulfate; and Hydrocodone-acetaminophen   Review of Systems Review of Systems  HENT: Positive for congestion and rhinorrhea.   Respiratory: Positive for cough.   Gastrointestinal: Positive for abdominal pain, nausea and vomiting.  All other systems reviewed and are negative.    Physical Exam Updated Vital Signs BP (!) 164/90 (BP Location: Right Arm)   Pulse 68   Temp 98.6 F (37 C) (Oral)   Resp 14   Ht 5\' 6"  (1.676 m)   Wt 68 kg   LMP 10/14/2017  SpO2 99%   BMI 24.21 kg/m   Physical Exam  Constitutional: She is oriented to person, place, and time. She appears well-developed and well-nourished.  Non toxic  HENT:  Head: Normocephalic and atraumatic.  Nose: Nose normal.  Eyes: Pupils are equal, round, and reactive to light. Conjunctivae and EOM are normal.  Neck: Normal range of motion.  Cardiovascular: Normal rate and regular rhythm.  1+ radial and DP pulses bilaterally. No murmur.   Pulmonary/Chest: Effort normal and breath sounds normal.  Abdominal: Soft. Bowel sounds are normal. There is tenderness (moderate).  No G/R/R. No suprapubic or CVA tenderness. Negative Murphy's and McBurney's. Active BS to lower quadrants.   Musculoskeletal: Normal range of motion.  Neurological: She is alert and oriented to person, place, and time.  Sensation to light touch intact in hands and feet. 5/5 strength in hands and feet.   Skin: Skin is warm and dry. Capillary refill takes less than 2 seconds.  No rash to abdomen   Psychiatric: She has a normal mood and affect. Her behavior is normal.  Nursing note and vitals reviewed.    ED  Treatments / Results  Labs (all labs ordered are listed, but only abnormal results are displayed) Labs Reviewed  COMPREHENSIVE METABOLIC PANEL - Abnormal; Notable for the following components:      Result Value   Glucose, Bld 113 (*)    BUN 24 (*)    All other components within normal limits  CBC - Abnormal; Notable for the following components:   WBC 19.6 (*)    RBC 5.44 (*)    All other components within normal limits  URINALYSIS, ROUTINE W REFLEX MICROSCOPIC - Abnormal; Notable for the following components:   APPearance CLOUDY (*)    All other components within normal limits  LIPASE, BLOOD  I-STAT BETA HCG BLOOD, ED ([facility], [facility], [facility] ONLY)    EKG None  Radiology US Abdomen Complete  Result Date: 10/14/2018 CLINICAL DATA:  Pain for 3 days EXAM: ABDOMEN ULTRASOUND COMPLETE COMPARISON:  None. FINDINGS: Gallbladder: No gallstones or wall thickening visualized. There is no pericholecystic fluid. No sonographic Murphy sign noted by sonographer. Common bile duct: Diameter: 5 mm. No intrahepatic, common hepatic, or common bile duct dilatation. Liver: No focal lesion identified. Within normal limits in parenchymal echogenicity. Portal vein is patent on color Doppler imaging with normal direction of blood flow towards the liver. IVC: No abnormality visualized. Pancreas: Visualized portion unremarkable. Portions of the tail of the pancreas are obscured by gas. Spleen: Size and appearance within normal limits. Right Kidney: Length: 10.6 cm. Echogenicity within normal limits. No mass or hydronephrosis visualized. Left Kidney: Length: 11.4 cm. Echogenicity within normal limits. No mass or hydronephrosis visualized. Abdominal aorta: No aneurysm visualized. Other findings: No demonstrable ascites. IMPRESSION: 1. Portions of pancreas obscured by gas. Visualized portions of pancreas appear normal. 2.  Study otherwise unremarkable. Electronically Signed   By: Bretta Bang III M.D.   On: 10/14/2018 08:52    Ct Abdomen Pelvis W Contrast  Result Date: 10/14/2018 CLINICAL DATA:  Abdominal pain EXAM: CT ABDOMEN AND PELVIS WITH CONTRAST TECHNIQUE: Multidetector CT imaging of the abdomen and pelvis was performed using the standard protocol following bolus administration of intravenous contrast. CONTRAST:  ISOVUE-300 IOPAMIDOL (ISOVUE-300) INJECTION 61% COMPARISON:  Ultrasound earlier today FINDINGS: Lower chest: Moderate-sized hiatal hernia. Lung bases are clear. No effusions. Heart is normal size. Hepatobiliary: No focal hepatic abnormality. Gallbladder unremarkable. Pancreas: No focal abnormality or ductal dilatation. Spleen: No focal  abnormality.  Normal size. Adrenals/Urinary Tract: No adrenal abnormality. No focal renal abnormality. No stones or hydronephrosis. Urinary bladder is unremarkable. Stomach/Bowel: Abnormal appearance of the 4th portion of the duodenum to the ligament of Treitz with circumferential wall thickening and surrounding inflammation/stranding. No evidence of bowel obstruction. Remainder of the small bowel, stomach and large bowel unremarkable. Prior appendectomy. Vascular/Lymphatic: Aortic atherosclerosis. No enlarged abdominal or pelvic lymph nodes. Reproductive: Uterus and adnexa unremarkable.  No mass. Other: No free fluid or free air. Musculoskeletal: No acute bony abnormality. Degenerative disc disease at L5-S1. Bilateral L5 pars defects with grade 1 anterolisthesis. IMPRESSION: Abnormal appearance of the distal duodenum with circumferential wall thickening and surrounding inflammation. Findings compatible with infectious or inflammatory enteritis. Moderate-sized hiatal hernia. Aortic atherosclerosis. Electronically Signed   By: Charlett Nose M.D.   On: 10/14/2018 11:00    Procedures Procedures (including critical care time)  Medications Ordered in ED Medications  ondansetron (ZOFRAN-ODT) disintegrating tablet 4 mg (4 mg Oral Given 10/14/18 0507)  sodium chloride 0.9 % bolus  1,000 mL (0 mLs Intravenous Stopped 10/14/18 0952)  morphine 4 MG/ML injection 4 mg (4 mg Intravenous Given 10/14/18 0756)  famotidine (PEPCID) IVPB 20 mg premix (0 mg Intravenous Stopped 10/14/18 0826)  losartan (COZAAR) tablet 100 mg (100 mg Oral Given 10/14/18 0759)  hydrochlorothiazide (HYDRODIURIL) tablet 12.5 mg (12.5 mg Oral Given 10/14/18 0757)  morphine 4 MG/ML injection 6 mg (6 mg Intravenous Given 10/14/18 1001)  iopamidol (ISOVUE-300) 61 % injection 100 mL (100 mLs Intravenous Contrast Given 10/14/18 1056)     Initial Impression / Assessment and Plan / ED Course  I have reviewed the triage vital signs and the nursing notes.  Pertinent labs & imaging results that were available during my care of the patient were reviewed by me and considered in my medical decision making (see chart for details).  Clinical Course as of Oct 15 1119  Wed Oct 14, 2018  0704 WBC(!): 19.6 [CG]  0708 WBC(!): 19.6 [CG]  1104 IMPRESSION: Abnormal appearance of the distal duodenum with circumferential wall thickening and surrounding inflammation. Findings compatible with infectious or inflammatory enteritis.  Moderate-sized hiatal hernia.  Aortic atherosclerosis.  CT ABDOMEN PELVIS W CONTRAST [CG]    Clinical Course User Index [CG] Liberty Handy, PA-C   Considering viral process given associated cold like symptoms.  Given location, also considering GERD vs gastritis vs PUD vs pancreatitis vs cholecystitis/biliary colic.  She reports poorly controlled acid reflux despite nexium which makes my suspicion higher for gastritis/PUD/GERD.  She is s/p appendectomy and has no lower abd tenderness. No urinary symptoms or changes in BM.  She described colicky type pain but has no renal stone history, blood in urine so renal stone is less likely in this clinical presentation.  She is hypertensive but has been non compliant with BP meds x 4 days.  I considered dissection but this is less likely. Pain could  be contributing to her elevated BP as well.  She has no Cp, SOB, distal paresthesias, asymmetric pulses.    0800: WBC 19.6 in setting of recent solumedrol.  She is afebrile w/o tachycardia. Lipase, LFTs, creatinine, UA unremarkable.  Moderate epigastric tenderness w/o peritonitis on exam.  We will give meds and reassess. We will obtain complete abd [place] to evaluate for aneurysm, gall bladder, pancreas, renal etiology.  If symptoms refractory or any changes in clinical exam, consider CTAP. Discussed this with pt who is in agreement.   0940: [place] negative. Pain significantly improved,  repeat abd exam reassuring. BP improved with BP med and pain control. Discussed results with pt. Strong suspicion for PUD/gastritis/GERD.  We will dc with omeprazole, famotidine, carafate, diet changes, GI f/u.  I discussed possibility of dissection unlikely with patient and mother.  They are aware of s/s that would warrant immediate return to ER. I don't see indication for further imaging such as CT today.   Final Clinical Impressions(s) / ED Diagnoses   1000: RN notified me of pts return of pain.  She has mild epigastric tenderness. Given refractory symptoms, will obtain CTAP. Pt and wife ok with this.   1120: CTAP shows enteritis. Discussed results with pt. Supportive care.  Final diagnoses:  Epigastric abdominal pain  Elevated blood pressure reading with diagnosis of hypertension  Enteritis    ED Discharge Orders         Ordered    sucralfate (CARAFATE) 1 GM/10ML suspension  3 times daily with meals & bedtime     10/14/18 0947    famotidine (PEPCID) 20 MG tablet  2 times daily     10/14/18 0950    ondansetron (ZOFRAN ODT) 4 MG disintegrating tablet  Every 8 hours PRN     10/14/18 1111             Jerrell Mylar 10/14/18 1120    Azalia Bilis, MD 10/16/18 [phone]

## 2018-10-14 NOTE — ED Notes (Signed)
Patient notified she is up for discharge, c/o pain worsening to what it was when she came in. EDP aware and at bedside.

## 2018-10-14 NOTE — ED Notes (Signed)
Patient transported to US 

## 2018-10-14 NOTE — ED Notes (Signed)
Patient transported to CT 

## 2018-10-14 NOTE — ED Triage Notes (Signed)
Pt reports generalized abd pain since Monday, states the pain comes in waves and they don't stop. Pt reports intermittent nausea with one episode of vomiting yesterday. Denies diarrhea. Hx of asthma, GERD and htn, not compliant with medications.

## 2018-10-14 NOTE — Discharge Instructions (Addendum)
You were seen in the ER for abdominal pain.   Labs, ultrasound and urine looked normal. CT showed inflammation of intestine consistent with enteritis or viral infection of gut.  Your blood pressure improved after pain control.   Given your symptoms, history of acid reflux I suspect pain is from poorly controlled acid reflux, gastritis or ulcer. All are treated similarly.  Supportive care for viral gut infection.   Take omeprazole 40 mg every morning on empty stomach, wait 20-30 min to eat or drink.  Take famotidine 20 mg every morning and night. Additionally, carafate suspension before every meal and before bed time. Avoid irritating foods and drinks, alcohol, aspirin, ibuprofen. Zofran for nausea. Tylenol for pain.   You need formal GI evaluation.   Return for worsening, constant pain, chest pain, shortness of breath, blood in vomit or stool, black stools, fevers, chills, one sided persistent tingling, numbness or weakness, tearing abdominal or back pain.

## 2018-11-02 ENCOUNTER — Encounter (HOSPITAL_COMMUNITY): Payer: Self-pay | Admitting: Emergency Medicine

## 2018-11-02 ENCOUNTER — Emergency Department (HOSPITAL_COMMUNITY)
Admission: EM | Admit: 2018-11-02 | Discharge: 2018-11-02 | Disposition: A | Discharge status: Home / Self Care | Payer: 59 | Attending: Emergency Medicine | Admitting: Emergency Medicine

## 2018-11-02 ENCOUNTER — Emergency Department (HOSPITAL_COMMUNITY): Payer: 59

## 2018-11-02 DIAGNOSIS — J4521 Mild intermittent asthma with (acute) exacerbation: Secondary | ICD-10-CM | POA: Diagnosis not present

## 2018-11-02 DIAGNOSIS — R062 Wheezing: Secondary | ICD-10-CM | POA: Diagnosis not present

## 2018-11-02 DIAGNOSIS — I10 Essential (primary) hypertension: Secondary | ICD-10-CM | POA: Diagnosis not present

## 2018-11-02 DIAGNOSIS — Z79899 Other long term (current) drug therapy: Secondary | ICD-10-CM | POA: Insufficient documentation

## 2018-11-02 DIAGNOSIS — R0602 Shortness of breath: Secondary | ICD-10-CM | POA: Diagnosis not present

## 2018-11-02 DIAGNOSIS — J45909 Unspecified asthma, uncomplicated: Secondary | ICD-10-CM | POA: Diagnosis not present

## 2018-11-02 DIAGNOSIS — R0902 Hypoxemia: Secondary | ICD-10-CM | POA: Diagnosis not present

## 2018-11-02 MED ORDER — IPRATROPIUM-ALBUTEROL 0.5-2.5 (3) MG/3ML IN SOLN
3.0000 mL | Freq: Once | RESPIRATORY_TRACT | Status: AC
  Administered 2018-11-02: 3 mL via RESPIRATORY_TRACT
  Filled 2018-11-02: qty 3

## 2018-11-02 MED ORDER — BENZONATATE 100 MG PO CAPS: 200.0000 mg | ORAL_CAPSULE | Freq: Three times a day (TID) | ORAL | 0 refills | Status: DC

## 2018-11-02 MED ORDER — ALBUTEROL SULFATE (2.5 MG/3ML) 0.083% IN NEBU: 2.5000 mg | INHALATION_SOLUTION | Freq: Four times a day (QID) | RESPIRATORY_TRACT | 12 refills | Status: DC | PRN

## 2018-11-02 MED ORDER — PREDNISONE 10 MG PO TABS: 40.0000 mg | ORAL_TABLET | Freq: Every day | ORAL | 0 refills | Status: AC

## 2018-11-02 NOTE — ED Triage Notes (Signed)
Brought by ems from home for c/o SOB.  Reports having a cold for a few days which usually sets off her asthma.  Using inhaler at home with not much relief.  Reports sleeping for a while tonight and waking up around 0430 unable to catch her breath.  Per ems tight with faint wheezing on arrival.  Given Mag 2gm IV, Solumedrol 125mg  IV, albuterol 15mg  and atrovent 1mg .

## 2018-11-02 NOTE — ED Provider Notes (Signed)
[facility] EMERGENCY DEPARTMENT Provider Note   CSN: [id number] Arrival date & time: 11/02/18  0609     History   Chief Complaint Chief Complaint  Patient presents with  . Shortness of Breath    HPI [name] is a 52 y.o. female with a past medical history of asthma, who presents to ED for evaluation of shortness of breath, wheezing. Patient has been sick with URI symptoms including cough, nasal congestion, rhinorrhea for the past week.  Sick contacts including her family members with similar symptoms.  She has been using her albuterol inhaler with no improvement in her symptoms.  States that she began feeling better yesterday.  However, she woke up this morning with worsening of her wheezing and shortness of breath.  States that this feels similar to her prior asthma exacerbations.  She has a nebulizer machine at home but ran out of medication for it.  She denies any productive cough, fever, chest pain, leg swelling.  HPI  Past Medical History:  Diagnosis Date  . Asthma   . Blood transfusion   . Depression   . GERD (gastroesophageal reflux disease)   . Hepatitis A   . Hypertension   . Jaundice   . Migraines   . Ovarian cyst   . UTI (urinary tract infection)     Patient Active Problem List   Diagnosis Date Noted  . Asthma exacerbation 08/04/2017  . Respiratory distress   . ALLERGIC RHINITIS 01/29/2010  . Attention deficit disorder 12/14/2009  . Anxiety state 10/23/2009  . Adjustment disorder with mixed anxiety and depressed mood 05/25/2009  . Hypertension 05/25/2009  . Asthma 05/25/2009  . GERD 05/25/2009    Past Surgical History:  Procedure Laterality Date  . APPENDECTOMY  1994  . OPEN REDUCTION INTERNAL FIXATION (ORIF) METACARPAL Right 08/15/2016   Procedure: OPEN REDUCTION INTERNAL FIXATION (ORIF) right ring finger middle phalanx fracture;  Surgeon: Betha Loa, MD;  Location: [facility];  Service: Orthopedics;   Laterality: Right;  . OVARIAN CYST REMOVAL    . TONSILLECTOMY AND ADENOIDECTOMY       OB History   No obstetric history on file.      Home Medications    Prior to Admission medications   Medication Sig Start Date End Date Taking? Authorizing Provider  albuterol (PROVENTIL) (2.5 MG/3ML) 0.083% nebulizer solution Take 3 mLs (2.5 mg total) by nebulization every 6 (six) hours as needed for wheezing or shortness of breath. 11/02/18   Donis Kotowski, PA-C  amphetamine-dextroamphetamine (ADDERALL XR) 20 MG 24 hr capsule Take 1 capsule (20 mg total) by mouth daily. Fill on or after 06/10/17 Patient not taking: Reported on 10/14/2018 06/03/17   Kristian Covey, MD  amphetamine-dextroamphetamine (ADDERALL XR) 20 MG 24 hr capsule Take 1 capsule (20 mg total) by mouth every morning. Patient not taking: Reported on 10/14/2018 06/03/17   Kristian Covey, MD  amphetamine-dextroamphetamine (ADDERALL XR) 20 MG 24 hr capsule Take 1 capsule (20 mg total) by mouth daily. Fill on or after 08/10/17 Patient not taking: Reported on 10/14/2018 06/03/17   Kristian Covey, MD  amphetamine-dextroamphetamine (ADDERALL XR) 25 MG 24 hr capsule Take 25 mg by mouth daily. 07/24/17   [provider]  amphetamine-dextroamphetamine (ADDERALL) 10 MG tablet Take One Tablet daily After Lunch Daily. Patient not taking: Reported on 10/14/2018 06/25/17   Kristian Covey, MD  amphetamine-dextroamphetamine (ADDERALL) 10 MG tablet Take one tablet daily after lunch - fill in one month  Patient not taking: Reported on 10/14/2018 06/25/17   Kristian CoveyBurchette, Bruce W, MD  amphetamine-dextroamphetamine (ADDERALL) 10 MG tablet Take one table daily after lunch - fill in two months Patient not taking: Reported on 10/14/2018 06/25/17   Kristian CoveyBurchette, Bruce W, MD  beclomethasone (QVAR REDIHALER) 80 MCG/ACT inhaler Inhale 2 puffs into the lungs 2 (two) times daily. 08/10/18   Georgetta HaberBurky, Natalie B, NP  benzonatate (TESSALON) 100 MG capsule Take 2  capsules (200 mg total) by mouth every 8 (eight) hours. 11/02/18   Yuvin Bussiere, PA-C  cetirizine (ZYRTEC) 10 MG tablet Take 1 tablet (10 mg total) by mouth daily. Patient not taking: Reported on 10/14/2018 08/10/18   Linus MakoBurky, Natalie B, NP  esomeprazole (NEXIUM) 40 MG capsule Take 40 mg by mouth daily at 12 noon.    [provider]  famotidine (PEPCID) 20 MG tablet Take 1 tablet (20 mg total) by mouth 2 (two) times daily. 10/14/18   Liberty HandyGibbons, Claudia J, PA-C  FLUoxetine (PROZAC) 40 MG capsule Take 40 mg by mouth daily. 10/05/18   [provider]  losartan-hydrochlorothiazide (HYZAAR) 100-12.5 MG tablet TAKE 1 TABLET BY MOUTH DAILY 12/03/16   Burchette, Elberta FortisBruce W, MD  naproxen sodium (ALEVE) 220 MG tablet Take 440 mg by mouth as needed (pain).    [provider]  ondansetron (ZOFRAN ODT) 4 MG disintegrating tablet Take 1 tablet (4 mg total) by mouth every 8 (eight) hours as needed for nausea or vomiting. 10/14/18   Liberty HandyGibbons, Claudia J, PA-C  predniSONE (DELTASONE) 10 MG tablet Take 4 tablets (40 mg total) by mouth daily for 4 days. 11/02/18 11/06/18  Dietrich PatesKhatri, Antuane Eastridge, PA-C  sertraline (ZOLOFT) 100 MG tablet take 1 tablet by mouth once daily Patient not taking: Reported on 08/10/2018 10/04/16   Kristian CoveyBurchette, Bruce W, MD  sucralfate (CARAFATE) 1 GM/10ML suspension Take 10 mLs (1 g total) by mouth 4 (four) times daily -  with meals and at bedtime. 10/14/18   Liberty HandyGibbons, Claudia J, PA-C    Family History Family History  Problem Relation Age of Onset  . Asthma Son   . Hypertension Other   . Crohn's disease Father   . Crohn's disease Maternal Aunt   . Esophageal cancer Maternal Uncle   . Breast cancer Maternal Grandmother     Social History Social History   Tobacco Use  . Smoking status: Never Smoker  . Smokeless tobacco: Never Used  Substance Use Topics  . Alcohol use: Yes    Comment: several times monthly  . Drug use: No     Allergies   Aspirin; Codeine sulfate; and  Hydrocodone-acetaminophen   Review of Systems Review of Systems  Constitutional: Negative for appetite change, chills and fever.  HENT: Negative for ear pain, rhinorrhea, sneezing and sore throat.   Eyes: Negative for photophobia and visual disturbance.  Respiratory: Positive for shortness of breath and wheezing. Negative for cough and chest tightness.   Cardiovascular: Negative for chest pain and palpitations.  Gastrointestinal: Negative for abdominal pain, blood in stool, constipation, diarrhea, nausea and vomiting.  Genitourinary: Negative for dysuria, hematuria and urgency.  Musculoskeletal: Negative for myalgias.  Skin: Negative for rash.  Neurological: Negative for dizziness, weakness and light-headedness.     Physical Exam Updated Vital Signs BP (!) 187/102   Pulse 68   Temp 97.8 F (36.6 C) (Oral)   Resp 13   Ht 5\' 6"  (1.676 m)   Wt 68 kg   SpO2 98%   BMI 24.20 kg/m   Physical Exam  Vitals signs and nursing note reviewed.  Constitutional:      General: She is not in acute distress.    Appearance: She is well-developed.  HENT:     Head: Normocephalic and atraumatic.     Right Ear: A middle ear effusion is present.     Left Ear: A middle ear effusion is present.     Nose: Nose normal.     Mouth/Throat:     Tonsils: No tonsillar exudate or tonsillar abscesses.  Eyes:     General: No scleral icterus.       Left eye: No discharge.     Conjunctiva/sclera: Conjunctivae normal.  Neck:     Musculoskeletal: Normal range of motion and neck supple.  Cardiovascular:     Rate and Rhythm: Normal rate and regular rhythm.     Heart sounds: Normal heart sounds. No murmur. No friction rub. No gallop.   Pulmonary:     Effort: Pulmonary effort is normal. No tachypnea or respiratory distress.     Breath sounds: Examination of the right-upper field reveals wheezing. Examination of the left-upper field reveals wheezing. Examination of the right-middle field reveals wheezing.  Examination of the left-middle field reveals wheezing. Examination of the right-lower field reveals wheezing. Examination of the left-lower field reveals wheezing. Wheezing present.  Abdominal:     General: Bowel sounds are normal. There is no distension.     Palpations: Abdomen is soft.     Tenderness: There is no abdominal tenderness. There is no guarding.  Musculoskeletal: Normal range of motion.  Skin:    General: Skin is warm and dry.     Findings: No rash.  Neurological:     Mental Status: She is alert.     Motor: No abnormal muscle tone.     Coordination: Coordination normal.      ED Treatments / Results  Labs (all labs ordered are listed, but only abnormal results are displayed) Labs Reviewed - No data to display  EKG EKG Interpretation  Date/Time:  Monday November 02 2018 06:17:13 EST Ventricular Rate:  87 PR Interval:    QRS Duration: 95 QT Interval:  413 QTC Calculation: 497 R Axis:     Text Interpretation:  Sinus rhythm Borderline repolarization abnormality Borderline prolonged QT interval Baseline wander in lead(s) II III aVF V3 No significant change since last tracing Confirmed by Rochele Raring [phone]) on 11/02/2018 6:22:26 AM   Radiology Dg Chest 2 View  Result Date: 11/02/2018 CLINICAL DATA:  Acute onset of shortness of breath. EXAM: CHEST - 2 VIEW COMPARISON:  Chest radiograph performed 08/14/2018 FINDINGS: The lungs are well-aerated and clear. There is no evidence of focal opacification, pleural effusion or pneumothorax. The heart is normal in size; the mediastinal contour is within normal limits. No acute osseous abnormalities are seen. IMPRESSION: No acute cardiopulmonary process seen. Electronically Signed   By: Roanna Raider M.D.   On: 11/02/2018 06:48    Procedures Procedures (including critical care time)  Medications Ordered in ED Medications  ipratropium-albuterol (DUONEB) 0.5-2.5 (3) MG/3ML nebulizer solution 3 mL (3 mLs Nebulization Given  11/02/18 0649)     Initial Impression / Assessment and Plan / ED Course  I have reviewed the triage vital signs and the nursing notes.  Pertinent labs & imaging results that were available during my care of the patient were reviewed by me and considered in my medical decision making (see chart for details).     52 year old female presents to ED for wheezing and  shortness of breath that worsened after she woke up this morning.  She reports having URI symptoms for the past week along with other family members in the home.  She woke up with worsening of her symptoms.  No improvement with her home albuterol inhaler.  She ran out of her nebulizer solution so has been unable to use this.  She was given 2 mg of magnesium, IV Solu-Medrol, 15 mg of albuterol and 1 mg of Atrovent by EMS.  On my exam patient still has persistent wheezing throughout bilateral lung fields and chest tightness noted.  She is satting at 97% on room air.  She is not tachycardic.  She is afebrile with no recent use of antipyretics.  Plan is to obtain chest x-ray, EKG, give additional DuoNeb and reassess.  Patient does note that her symptoms feel similar to her prior asthma exacerbations.  Denies any chest pain.  7:15 AM On recheck, patient resting comfortably.  Reports significant improvement in her wheezing and shortness of breath with the DuoNeb given here.  Breath sounds and vital signs have improved.  She is requesting discharge home with steroids.  We will also give refill of nebulizer solution and antitussives. Suspect that symptoms were due to asthma exacerbation in the setting of viral URI. Doubt cardiac cause of symptoms.  Advised her to return to ED for any severe or worsening symptoms.  Patient is hemodynamically stable, in NAD, and able to ambulate in the ED. Evaluation does not show pathology that would require ongoing emergent intervention or inpatient treatment. I explained the diagnosis to the patient. Pain has been  managed and has no complaints prior to discharge. Patient is comfortable with above plan and is stable for discharge at this time. All questions were answered prior to disposition. Strict return precautions for returning to the ED were discussed. Encouraged follow up with PCP.    Portions of this note were generated with Scientist, clinical (histocompatibility and immunogenetics)Dragon dictation software. Dictation errors may occur despite best attempts at proofreading.  Final Clinical Impressions(s) / ED Diagnoses   Final diagnoses:  Mild intermittent asthma with exacerbation    ED Discharge Orders         Ordered    albuterol (PROVENTIL) (2.5 MG/3ML) 0.083% nebulizer solution  Every 6 hours PRN     11/02/18 0720    predniSONE (DELTASONE) 10 MG tablet  Daily     11/02/18 0720    benzonatate (TESSALON) 100 MG capsule  Every 8 hours     11/02/18 0720           Dietrich PatesKhatri, Rainie Crenshaw, PA-C 11/02/18 0723    Ward, Layla MawKristen N, DO 11/02/18 [phone]

## 2018-11-02 NOTE — Discharge Instructions (Signed)
Take your steroids beginning tomorrow. Return to ED for worsening symptoms, trouble breathing, increased wheezing, chest pain, coughing up blood.

## 2018-11-02 NOTE — ED Notes (Signed)
Being Transported to Xray.

## 2018-11-16 ENCOUNTER — Ambulatory Visit (HOSPITAL_COMMUNITY)
Admission: EM | Admit: 2018-11-16 | Discharge: 2018-11-16 | Disposition: A | Discharge status: Home / Self Care | Payer: 59 | Attending: Family Medicine | Admitting: Family Medicine

## 2018-11-16 ENCOUNTER — Encounter (HOSPITAL_COMMUNITY): Payer: Self-pay | Admitting: Family Medicine

## 2018-11-16 DIAGNOSIS — J4551 Severe persistent asthma with (acute) exacerbation: Secondary | ICD-10-CM | POA: Insufficient documentation

## 2018-11-16 MED ORDER — FLUTICASONE-SALMETEROL 250-50 MCG/DOSE IN AEPB: 1.0000 | INHALATION_SPRAY | Freq: Two times a day (BID) | RESPIRATORY_TRACT | 12 refills | Status: DC

## 2018-11-16 MED ORDER — ALBUTEROL SULFATE (2.5 MG/3ML) 0.083% IN NEBU: 2.5000 mg | INHALATION_SOLUTION | Freq: Four times a day (QID) | RESPIRATORY_TRACT | 12 refills | Status: DC | PRN

## 2018-11-16 MED ORDER — IPRATROPIUM-ALBUTEROL 0.5-2.5 (3) MG/3ML IN SOLN
3.0000 mL | Freq: Once | RESPIRATORY_TRACT | Status: AC
  Administered 2018-11-16: 3 mL via RESPIRATORY_TRACT

## 2018-11-16 MED ORDER — PREDNISONE 20 MG PO TABS: ORAL_TABLET | ORAL | 0 refills | Status: DC

## 2018-11-16 MED ORDER — IPRATROPIUM-ALBUTEROL 0.5-2.5 (3) MG/3ML IN SOLN
RESPIRATORY_TRACT | Status: AC
  Filled 2018-11-16: qty 3

## 2018-11-16 NOTE — ED Provider Notes (Signed)
[facility]    CSN: [id number] Arrival date & time: 11/16/18  1545     History   Chief Complaint No chief complaint on file.   HPI [name] is a 53 y.o. female.   This is an established [name] urgent care patient, 53 years old, female, and asthmatic.  She is having multiple trips to the emergency room and has had ambulance rides twice in the last year.  She continues to wheeze although she did get somewhat better after the last exacerbation when she took prednisone.  She never fully cleared.  She is currently on a nebulizer and Qvar..  She is had no fever and a cough is nonproductive.     Past Medical History:  Diagnosis Date  . Asthma   . Blood transfusion   . Depression   . GERD (gastroesophageal reflux disease)   . Hepatitis A   . Hypertension   . Jaundice   . Migraines   . Ovarian cyst   . UTI (urinary tract infection)     Patient Active Problem List   Diagnosis Date Noted  . Asthma exacerbation 08/04/2017  . Respiratory distress   . ALLERGIC RHINITIS 01/29/2010  . Attention deficit disorder 12/14/2009  . Anxiety state 10/23/2009  . Adjustment disorder with mixed anxiety and depressed mood 05/25/2009  . Hypertension 05/25/2009  . Asthma 05/25/2009  . GERD 05/25/2009    Past Surgical History:  Procedure Laterality Date  . APPENDECTOMY  1994  . OPEN REDUCTION INTERNAL FIXATION (ORIF) METACARPAL Right 08/15/2016   Procedure: OPEN REDUCTION INTERNAL FIXATION (ORIF) right ring finger middle phalanx fracture;  Surgeon: Betha Loa, MD;  Location: [facility];  Service: Orthopedics;  Laterality: Right;  . OVARIAN CYST REMOVAL    . TONSILLECTOMY AND ADENOIDECTOMY      OB History   No obstetric history on file.      Home Medications    Prior to Admission medications   Medication Sig Start Date End Date Taking? Authorizing Provider  albuterol (PROVENTIL) (2.5 MG/3ML) 0.083% nebulizer solution Take 3 mLs (2.5 mg  total) by nebulization every 6 (six) hours as needed for wheezing or shortness of breath. 11/16/18   Elvina Sidle, MD  benzonatate (TESSALON) 100 MG capsule Take 2 capsules (200 mg total) by mouth every 8 (eight) hours. 11/02/18   Khatri, Hina, PA-C  cetirizine (ZYRTEC) 10 MG tablet Take 1 tablet (10 mg total) by mouth daily. Patient not taking: Reported on 10/14/2018 08/10/18   Linus Mako B, NP  esomeprazole (NEXIUM) 40 MG capsule Take 40 mg by mouth daily at 12 noon.    [provider]  famotidine (PEPCID) 20 MG tablet Take 1 tablet (20 mg total) by mouth 2 (two) times daily. 10/14/18   Liberty Handy, PA-C  FLUoxetine (PROZAC) 40 MG capsule Take 40 mg by mouth daily. 10/05/18   [provider]  Fluticasone-Salmeterol (ADVAIR DISKUS) 250-50 MCG/DOSE AEPB Inhale 1 puff into the lungs 2 (two) times daily. 11/16/18   Elvina Sidle, MD  losartan-hydrochlorothiazide [facility]) 100-12.5 MG tablet TAKE 1 TABLET BY MOUTH DAILY 12/03/16   Burchette, Elberta Fortis, MD  naproxen sodium (ALEVE) 220 MG tablet Take 440 mg by mouth as needed (pain).    [provider]  ondansetron (ZOFRAN ODT) 4 MG disintegrating tablet Take 1 tablet (4 mg total) by mouth every 8 (eight) hours as needed for nausea or vomiting. 10/14/18   Liberty Handy, PA-C  predniSONE (DELTASONE) 20 MG  tablet Take 3 PO QAM x3days, 2 PO QAM x3days, 1 PO QAM x3days 11/16/18   Elvina SidleLauenstein, Theon Sobotka, MD  sucralfate (CARAFATE) 1 GM/10ML suspension Take 10 mLs (1 g total) by mouth 4 (four) times daily -  with meals and at bedtime. 10/14/18   Liberty HandyGibbons, Claudia J, PA-C    Family History Family History  Problem Relation Age of Onset  . Asthma Son   . Hypertension Other   . Crohn's disease Father   . Crohn's disease Maternal Aunt   . Esophageal cancer Maternal Uncle   . Breast cancer Maternal Grandmother     Social History Social History   Tobacco Use  . Smoking status: Never Smoker  . Smokeless tobacco: Never  Used  Substance Use Topics  . Alcohol use: Yes    Comment: several times monthly  . Drug use: No     Allergies   Aspirin; Codeine sulfate; and Hydrocodone-acetaminophen   Review of Systems Review of Systems   Physical Exam Triage Vital Signs ED Triage Vitals  Enc Vitals Group     BP      Pulse      Resp      Temp      Temp src      SpO2      Weight      Height      Head Circumference      Peak Flow      Pain Score      Pain Loc      Pain Edu?      Excl. in GC?    No data found.  Updated Vital Signs There were no vitals taken for this visit.   Physical Exam Vitals signs and nursing note reviewed.  Constitutional:      Appearance: Normal appearance.  HENT:     Head: Normocephalic.     Nose: Nose normal.     Mouth/Throat:     Pharynx: Oropharynx is clear.  Eyes:     Conjunctiva/sclera: Conjunctivae normal.  Neck:     Musculoskeletal: Normal range of motion and neck supple.  Pulmonary:     Effort: Respiratory distress present.     Breath sounds: Wheezing present.  Musculoskeletal: Normal range of motion.  Skin:    General: Skin is warm and dry.  Neurological:     General: No focal deficit present.     Mental Status: She is alert and oriented to person, place, and time.  Psychiatric:        Mood and Affect: Mood normal.        Behavior: Behavior normal.      UC Treatments / Results  Labs (all labs ordered are listed, but only abnormal results are displayed) Labs Reviewed - No data to display  EKG None  Radiology No results found.  Procedures Procedures (including critical care time)  Medications Ordered in UC Medications  ipratropium-albuterol (DUONEB) 0.5-2.5 (3) MG/3ML nebulizer solution 3 mL (has no administration in time range)    Initial Impression / Assessment and Plan / UC Course  I have reviewed the triage vital signs and the nursing notes.  Pertinent labs & imaging results that were available during my care of the patient  were reviewed by me and considered in my medical decision making (see chart for details).    Final Clinical Impressions(s) / UC Diagnoses   Final diagnoses:  Severe persistent asthma with acute exacerbation     Discharge Instructions     I have  refilled your albuterol nebulizer solution for the next year.  We are switching you from Qvar to Advair which adds along acting bronchodilator to the steroid.  We will giving you another round of prednisone.    ED Prescriptions    Medication Sig Dispense Auth. Provider   albuterol (PROVENTIL) (2.5 MG/3ML) 0.083% nebulizer solution Take 3 mLs (2.5 mg total) by nebulization every 6 (six) hours as needed for wheezing or shortness of breath. 75 mL Elvina SidleLauenstein, Sherlyn Ebbert, MD   predniSONE (DELTASONE) 20 MG tablet Take 3 PO QAM x3days, 2 PO QAM x3days, 1 PO QAM x3days 18 tablet Elvina SidleLauenstein, [place] Depaz, MD   Fluticasone-Salmeterol (ADVAIR DISKUS) 250-50 MCG/DOSE AEPB Inhale 1 puff into the lungs 2 (two) times daily. 60 each Elvina SidleLauenstein, Tamber Burtch, MD     Controlled Substance Prescriptions [place] Controlled Substance Registry consulted? Not Applicable   Elvina SidleLauenstein, Giah Fickett, MD 11/16/18 1800

## 2018-11-16 NOTE — Discharge Instructions (Addendum)
I have refilled your albuterol nebulizer solution for the next year.  We are switching you from Qvar to Advair which adds along acting bronchodilator to the steroid.  We will giving you another round of prednisone.

## 2018-11-16 NOTE — ED Triage Notes (Signed)
Pt here for increased asthma sx

## 2018-11-19 DIAGNOSIS — Z23 Encounter for immunization: Secondary | ICD-10-CM | POA: Diagnosis not present

## 2019-01-14 ENCOUNTER — Ambulatory Visit (INDEPENDENT_AMBULATORY_CARE_PROVIDER_SITE_OTHER): Payer: 59 | Admitting: Internal Medicine

## 2019-01-14 ENCOUNTER — Ambulatory Visit (INDEPENDENT_AMBULATORY_CARE_PROVIDER_SITE_OTHER): Payer: 59

## 2019-01-14 ENCOUNTER — Ambulatory Visit: Payer: Self-pay

## 2019-01-14 ENCOUNTER — Other Ambulatory Visit: Payer: Self-pay

## 2019-01-14 ENCOUNTER — Encounter: Payer: Self-pay | Admitting: Internal Medicine

## 2019-01-14 VITALS — BP 150/90 | HR 102 | Temp 98.8°F | Wt 177.6 lb

## 2019-01-14 DIAGNOSIS — J45901 Unspecified asthma with (acute) exacerbation: Secondary | ICD-10-CM

## 2019-01-14 DIAGNOSIS — R05 Cough: Secondary | ICD-10-CM | POA: Diagnosis not present

## 2019-01-14 DIAGNOSIS — R0602 Shortness of breath: Secondary | ICD-10-CM | POA: Diagnosis not present

## 2019-01-14 MED ORDER — PREDNISONE 10 MG (21) PO TBPK: ORAL_TABLET | ORAL | 0 refills | Status: DC

## 2019-01-14 MED ORDER — METHYLPREDNISOLONE ACETATE 80 MG/ML IJ SUSP
80.0000 mg | Freq: Once | INTRAMUSCULAR | Status: AC
  Administered 2019-01-14: 80 mg via INTRAMUSCULAR

## 2019-01-14 MED ORDER — CEFTRIAXONE SODIUM 1 G IJ SOLR
1.0000 g | Freq: Once | INTRAMUSCULAR | Status: AC
  Administered 2019-01-14: 1 g via INTRAMUSCULAR

## 2019-01-14 MED ORDER — AZITHROMYCIN 500 MG PO TABS: 500.0000 mg | ORAL_TABLET | Freq: Every day | ORAL | 0 refills | Status: DC

## 2019-01-14 MED ORDER — ALBUTEROL SULFATE HFA 108 (90 BASE) MCG/ACT IN AERS: 2.0000 | INHALATION_SPRAY | Freq: Four times a day (QID) | RESPIRATORY_TRACT | 2 refills | Status: DC | PRN

## 2019-01-14 MED ORDER — ALBUTEROL SULFATE (2.5 MG/3ML) 0.083% IN NEBU
2.5000 mg | INHALATION_SOLUTION | RESPIRATORY_TRACT | Status: AC
  Administered 2019-01-14: 2.5 mg via RESPIRATORY_TRACT

## 2019-01-14 MED ORDER — ALBUTEROL SULFATE (2.5 MG/3ML) 0.083% IN NEBU: 2.5000 mg | INHALATION_SOLUTION | Freq: Four times a day (QID) | RESPIRATORY_TRACT | 12 refills | Status: DC | PRN

## 2019-01-14 NOTE — Progress Notes (Signed)
Established Patient Office Visit     CC/Reason for Visit: Cough, wheezing  HPI: [name] is a 53 y.o. female who is coming in today for the above mentioned reasons. Past Medical History is significant for: asthma, she has not seen Dr. Caryl Never since 2018. Her son was seen yesterday due to respiratory symptoms and was told he had a URI. Today she has developed some of the same symptoms: wheezing, mild SOB, cough productive of yellow sputum, she has had no fever or chills. +sinus drainage and stuffy nose. She wanted a Rx for prednisone to be sent into the office and was told she needed to come in for evaluation.   Past Medical/Surgical History: Past Medical History:  Diagnosis Date  . Asthma   . Blood transfusion   . Depression   . GERD (gastroesophageal reflux disease)   . Hepatitis A   . Hypertension   . Jaundice   . Migraines   . Ovarian cyst   . UTI (urinary tract infection)     Past Surgical History:  Procedure Laterality Date  . APPENDECTOMY  1994  . OPEN REDUCTION INTERNAL FIXATION (ORIF) METACARPAL Right 08/15/2016   Procedure: OPEN REDUCTION INTERNAL FIXATION (ORIF) right ring finger middle phalanx fracture;  Surgeon: Betha Loa, MD;  Location: [facility];  Service: Orthopedics;  Laterality: Right;  . OVARIAN CYST REMOVAL    . TONSILLECTOMY AND ADENOIDECTOMY      Social History:  reports that she has never smoked. She has never used smokeless tobacco. She reports current alcohol use. She reports that she does not use drugs.  Allergies: Allergies  Allergen Reactions  . Aspirin Shortness Of Breath    REACTION: wheezing problems  . Codeine Sulfate Hives and Nausea And Vomiting    REACTION: rash, itiching  . Hydrocodone-Acetaminophen Itching and Rash    Family History:  Family History  Problem Relation Age of Onset  . Asthma Son   . Hypertension Other   . Crohn's disease Father   . Crohn's disease Maternal Aunt   .  Esophageal cancer Maternal Uncle   . Breast cancer Maternal Grandmother      Current Outpatient Medications:  .  albuterol (PROVENTIL) (2.5 MG/3ML) 0.083% nebulizer solution, Take 3 mLs (2.5 mg total) by nebulization every 6 (six) hours as needed for wheezing or shortness of breath., Disp: 75 mL, Rfl: 12 .  esomeprazole (NEXIUM) 40 MG capsule, Take 40 mg by mouth daily at 12 noon., Disp: , Rfl:  .  FLUoxetine (PROZAC) 40 MG capsule, Take 40 mg by mouth daily., Disp: , Rfl: 1 .  Fluticasone-Salmeterol (ADVAIR DISKUS) 250-50 MCG/DOSE AEPB, Inhale 1 puff into the lungs 2 (two) times daily., Disp: 60 each, Rfl: 12 .  naproxen sodium (ALEVE) 220 MG tablet, Take 440 mg by mouth as needed (pain)., Disp: , Rfl:  .  albuterol (PROVENTIL HFA;VENTOLIN HFA) 108 (90 Base) MCG/ACT inhaler, Inhale 2 puffs into the lungs every 6 (six) hours as needed for wheezing or shortness of breath., Disp: 1 Inhaler, Rfl: 2 .  azithromycin (ZITHROMAX) 500 MG tablet, Take 1 tablet (500 mg total) by mouth daily., Disp: 5 tablet, Rfl: 0 .  cetirizine (ZYRTEC) 10 MG tablet, Take 1 tablet (10 mg total) by mouth daily. (Patient not taking: Reported on 10/14/2018), Disp: 30 tablet, Rfl: 0 .  losartan-hydrochlorothiazide (HYZAAR) 100-12.5 MG tablet, TAKE 1 TABLET BY MOUTH DAILY (Patient not taking: Reported on 01/14/2019), Disp: 90 tablet, Rfl: 2 .  predniSONE (STERAPRED UNI-PAK 21 TAB) 10 MG (21) TBPK tablet, Take as directed, Disp: 21 tablet, Rfl: 0  Current Facility-Administered Medications:  .  cefTRIAXone (ROCEPHIN) injection 1 g, 1 g, Intramuscular, Once, Philip Aspen, Limmie Patricia, MD .  methylPREDNISolone acetate (DEPO-MEDROL) injection 80 mg, 80 mg, Intramuscular, Once, Philip Aspen, Limmie Patricia, MD  Review of Systems:  Constitutional: Denies fever, chills, diaphoresis, appetite change and fatigue.  HEENT: Denies photophobia, eye pain, redness, hearing loss, ear pain,  sneezing, mouth sores, trouble swallowing, neck  pain, neck stiffness and tinnitus.   Respiratory: Denies  chest tightness.  Cardiovascular: Denies chest pain, palpitations and leg swelling.  Gastrointestinal: Denies nausea, vomiting, abdominal pain, diarrhea, constipation, blood in stool and abdominal distention.  Genitourinary: Denies dysuria, urgency, frequency, hematuria, flank pain and difficulty urinating.  Endocrine: Denies: hot or cold intolerance, sweats, changes in hair or nails, polyuria, polydipsia. Musculoskeletal: Denies myalgias, back pain, joint swelling, arthralgias and gait problem.  Skin: Denies pallor, rash and wound.  Neurological: Denies dizziness, seizures, syncope, weakness, light-headedness, numbness and headaches.  Hematological: Denies adenopathy. Easy bruising, personal or family bleeding history  Psychiatric/Behavioral: Denies suicidal ideation, mood changes, confusion, nervousness, sleep disturbance and agitation    Physical Exam: Vitals:   01/14/19 1255  BP: (!) 150/90  Pulse: (!) 102  Temp: 98.8 F (37.1 C)  TempSrc: Oral  SpO2: 97%  Weight: 177 lb 9.6 oz (80.6 kg)    Body mass index is 28.67 kg/m.   Constitutional: NAD, calm, comfortable Eyes: PERRL, lids and conjunctivae normal ENMT: Mucous membranes are moist. Posterior pharynx erythematous but clear of any exudate or lesions. Normal dentition. Tympanic membrane is pearly white, no erythema or bulging. Neck: normal, supple, no masses, no thyromegaly, no LAD Respiratory: bilateral wheezing and coarse BS Cardiovascular: Regular rate and rhythm, no murmurs / rubs / gallops. No extremity edema. 2+ pedal pulses. No carotid bruits.  Psychiatric: Normal judgment and insight. Alert and oriented x 3. Normal mood.    Impression and Plan:  Severe asthma with exacerbation, unspecified whether persistent  -She has a mild asthma exacerbation, likely due to a URI. -She was given rocephin, methylprednisolone and a breathing treatment in the office with  some improvement. -CXR done and results pending -Will send home with a Rx for prednisone and azithromycin. -She will be asked to work from home for the next week and have written a note to reflect this. -She has been advised to RTC if no improvement in 7-10 days and to go to the ER if increased SOB and fever.  Time spent: 40 minutes. Greater than 50% of this time was spent in direct contact with the patient, coordinating care and discussing relevant ongoing clinical issues, including office evaluation, ordering and administering treatments in the office, reviewing results of CXR and reassessing patient following completion of treatment.    Patient Instructions  -Hope you feel better soon!!  -Prednisone pack for 6 days.  -Azithromycin 500 mg for 5 days.  -Schedule follow up if no improvement in 7-10 days.  -If fever, worsening SOB or cough, please proceed to the emergency room.       Chaya Jan, MD [facility]

## 2019-01-14 NOTE — Addendum Note (Signed)
Addended by: Kern Reap B on: 01/14/2019 01:48 PM   Modules accepted: Orders

## 2019-01-14 NOTE — Telephone Encounter (Signed)
Returned call to patient who is requesting medication to be phoned in for her symptoms of cold and asthma flare. She has a runny nose and cough. No fever. She is wheezing.  She states that she was in yesterday with her son and he was given medication by Dr Caryl Never for the same problem.  Call placed to office [facility].  Per Okey Regal pt will need appointment. Pt was informed and appointment scheduled per protocol.  Reason for Disposition . Wheezing is present  Answer Assessment - Initial Assessment Questions 1. ONSET: "When did the cough begin?"     Cold symptoms started yesterday 2. SEVERITY: "How bad is the cough today?"      asmatic symptoms 3. RESPIRATORY DISTRESS: "Describe your breathing."      wheezes 4. FEVER: "Do you have a fever?" If so, ask: "What is your temperature, how was it measured, and when did it start?"    No 5. SPUTUM: "Describe the color of your sputum" (clear, white, yellow, green)     clear 6. HEMOPTYSIS: "Are you coughing up any blood?" If so ask: "How much?" (flecks, streaks, tablespoons, etc.)    no 7. CARDIAC HISTORY: "Do you have any history of heart disease?" (e.g., heart attack, congestive heart failure)      no 8. LUNG HISTORY: "Do you have any history of lung disease?"  (e.g., pulmonary embolus, asthma, emphysema)    Asthma 9. PE RISK FACTORS: "Do you have a history of blood clots?" (or: recent major surgery, recent prolonged travel, bedridden)    no 10. OTHER SYMPTOMS: "Do you have any other symptoms?" (e.g., runny nose, wheezing, chest pain)      Nose is runny 11. PREGNANCY: "Is there any chance you are pregnant?" "When was your last menstrual period?"       N/A 12. TRAVEL: "Have you traveled out of the country in the last month?" (e.g., travel history, exposures)      No  Protocols used: COUGH - ACUTE PRODUCTIVE-A-AH

## 2019-01-14 NOTE — Patient Instructions (Addendum)
-  Hope you feel better soon!!  -Prednisone pack for 6 days.  -Azithromycin 500 mg for 5 days.  -Schedule follow up if no improvement in 7-10 days.  -If fever, worsening SOB or cough, please proceed to the emergency room.

## 2019-01-15 DIAGNOSIS — R000 Tachycardia, unspecified: Secondary | ICD-10-CM | POA: Diagnosis not present

## 2019-01-15 DIAGNOSIS — R0902 Hypoxemia: Secondary | ICD-10-CM | POA: Diagnosis not present

## 2019-01-15 DIAGNOSIS — R0602 Shortness of breath: Secondary | ICD-10-CM | POA: Diagnosis not present

## 2019-01-15 NOTE — Telephone Encounter (Signed)
FYI patient saw Dr. Ardyth Harps yesterday.

## 2019-02-16 ENCOUNTER — Other Ambulatory Visit: Payer: Self-pay | Admitting: Family Medicine

## 2019-02-16 NOTE — Telephone Encounter (Signed)
Needs follow up visit. Last seen in 2018 for BP

## 2019-02-17 ENCOUNTER — Telehealth: Payer: Self-pay

## 2019-02-17 NOTE — Telephone Encounter (Signed)
Called patient and was NOT able to leave a detailed voice message on her cell phone due to a full mailbox. Patient needs to set up a Doxy appointment with Dr. Caryl Never for her blood pressure medicine. Has not been seen for this since 2018.  OK for [facility] to discuss/advise/schedule appointment for Blood Pressure follow up.  CRM Created.

## 2019-02-17 NOTE — Telephone Encounter (Signed)
Called patient and was NOT able to leave a detailed voice message on her cell phone due to a full mailbox. Patient needs to set up a Doxy appointment with Dr. Burchette for her blood pressure medicine. Has not been seen for this since 2018.  OK for PEC to discuss/advise/schedule appointment for Blood Pressure follow up.  CRM Created. 

## 2019-03-09 ENCOUNTER — Other Ambulatory Visit: Payer: Self-pay | Admitting: Internal Medicine

## 2019-03-09 DIAGNOSIS — J45901 Unspecified asthma with (acute) exacerbation: Secondary | ICD-10-CM

## 2019-03-09 NOTE — Telephone Encounter (Signed)
Patient needs to schedule a followup appointment.

## 2019-03-09 NOTE — Telephone Encounter (Signed)
Patient has an appointment on 03/10/19 at 1:30pm

## 2019-03-10 ENCOUNTER — Ambulatory Visit: Payer: 59 | Admitting: Family Medicine

## 2019-03-12 NOTE — Telephone Encounter (Signed)
Patient canceled her appointment. I called and left her another message to reschedule so we can fill her medications.

## 2019-05-26 ENCOUNTER — Ambulatory Visit: Payer: 59 | Admitting: Family Medicine

## 2019-05-26 ENCOUNTER — Other Ambulatory Visit: Payer: Self-pay

## 2019-07-19 IMAGING — US US ABDOMEN COMPLETE
1 series · 14 of 25 positions shown · non-contrast
Comparison: None.

CLINICAL DATA: Pain for 3 days

EXAM:
ABDOMEN ULTRASOUND COMPLETE

[Series 1: us abdomen complete · 0.23mm/px · 14 of 90 slices shown]
[im 1/90]
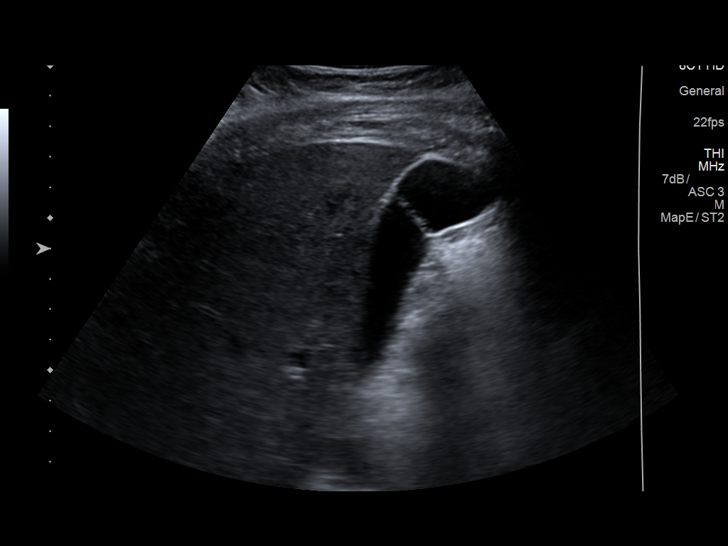
[im 8/90]
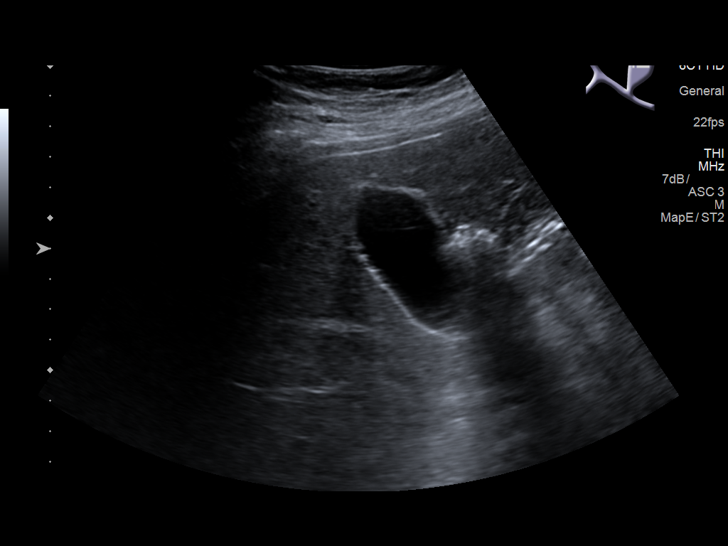
[im 15/90]
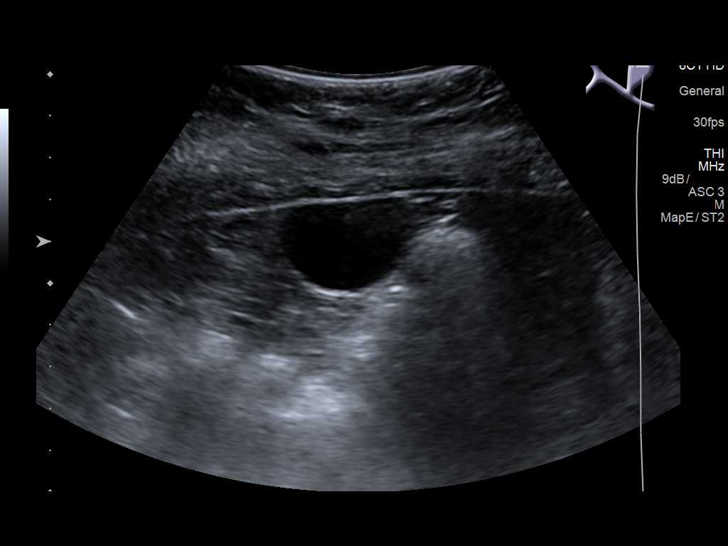
[im 23/90]
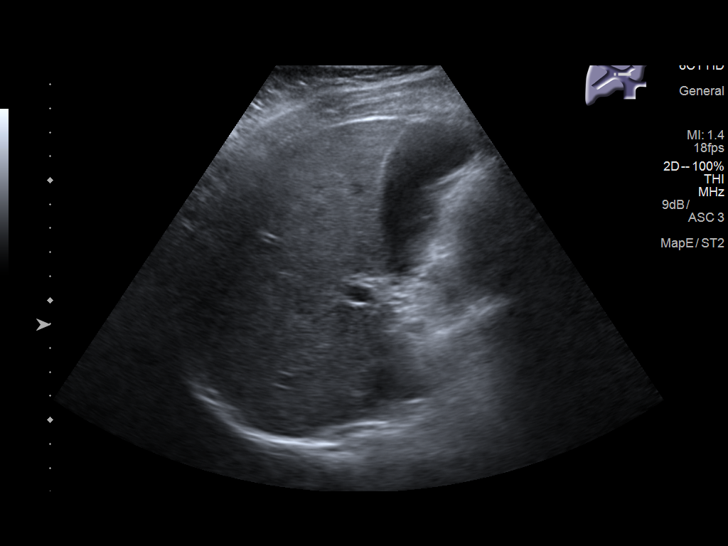
[im 30/90]
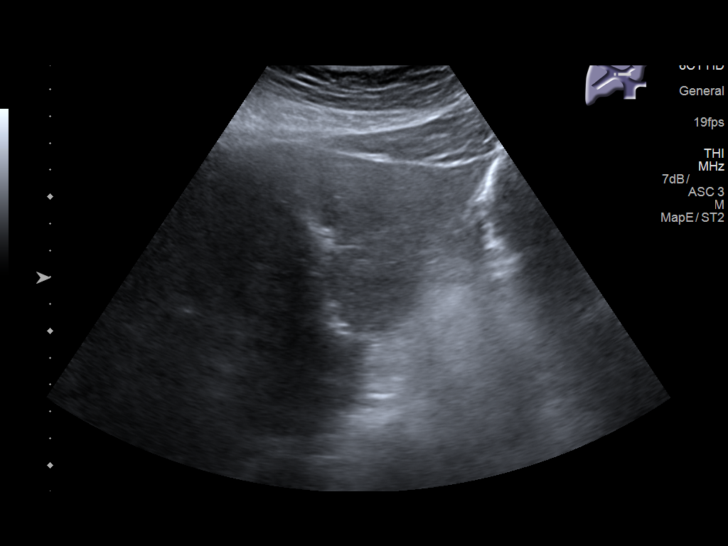
[im 34/90]
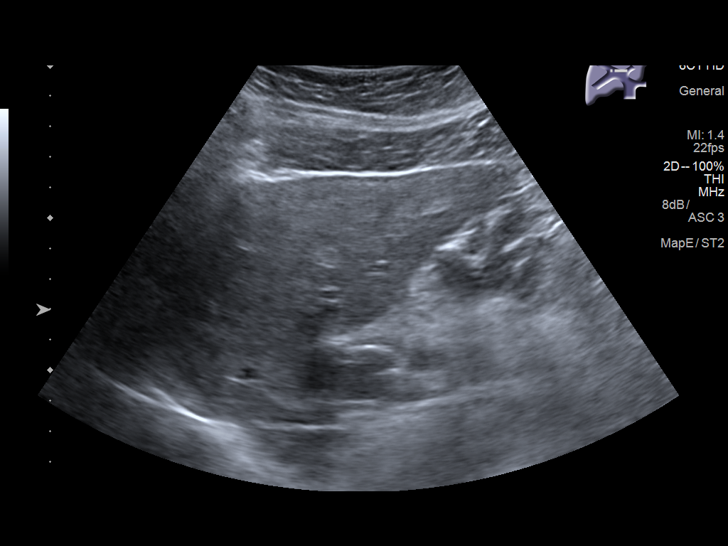
[im 41/90]
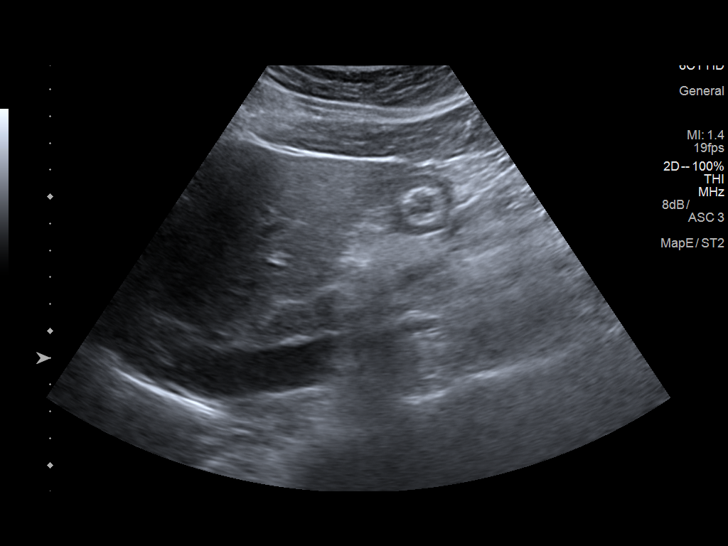
[im 49/90]
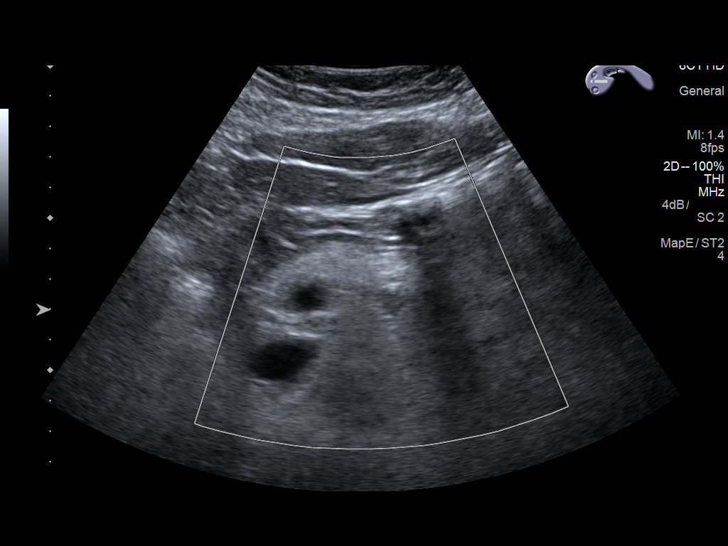
[im 56/90]
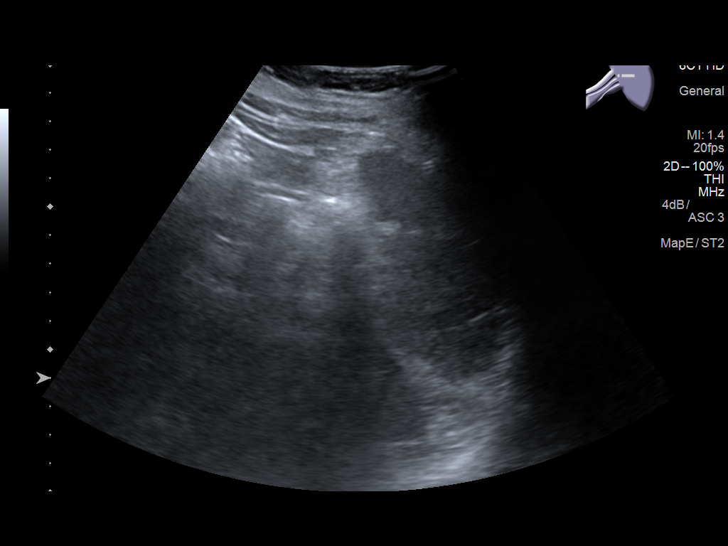
[im 60/90]
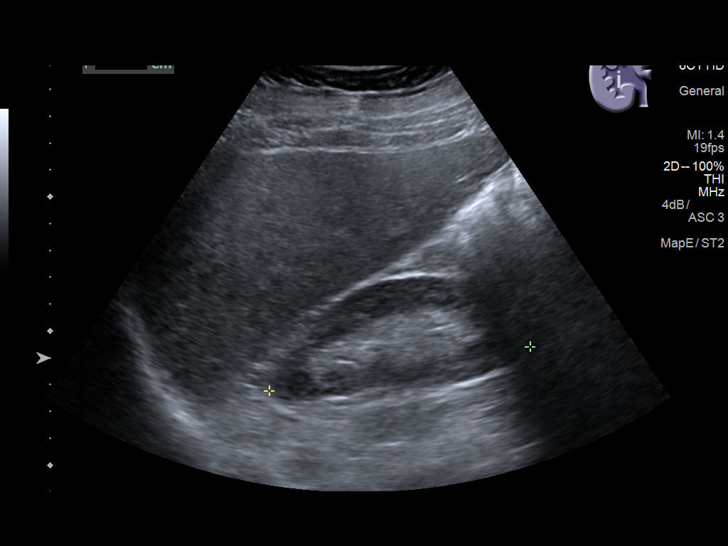
[im 67/90]
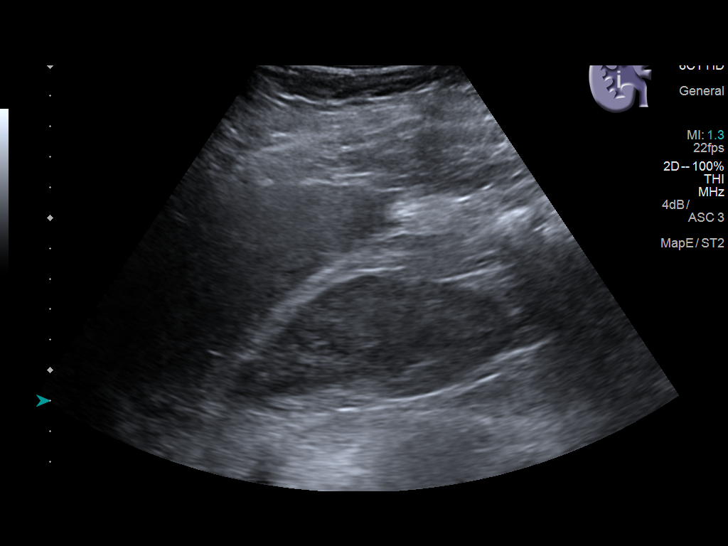
[im 75/90]
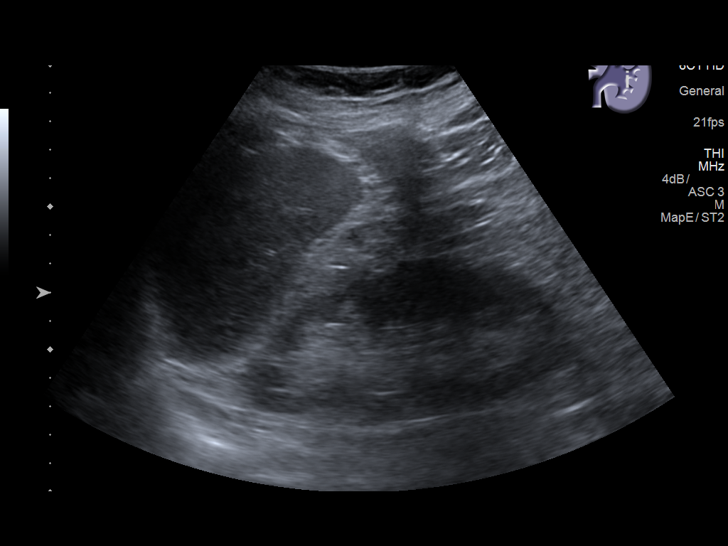
[im 82/90]
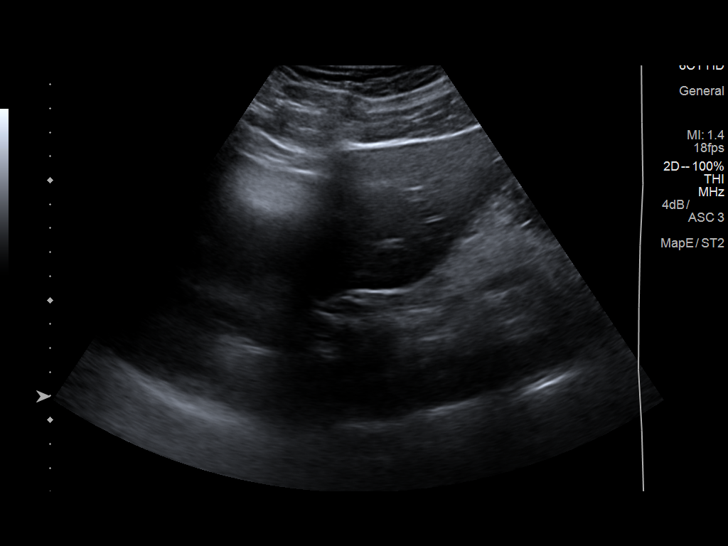
[im 90/90]
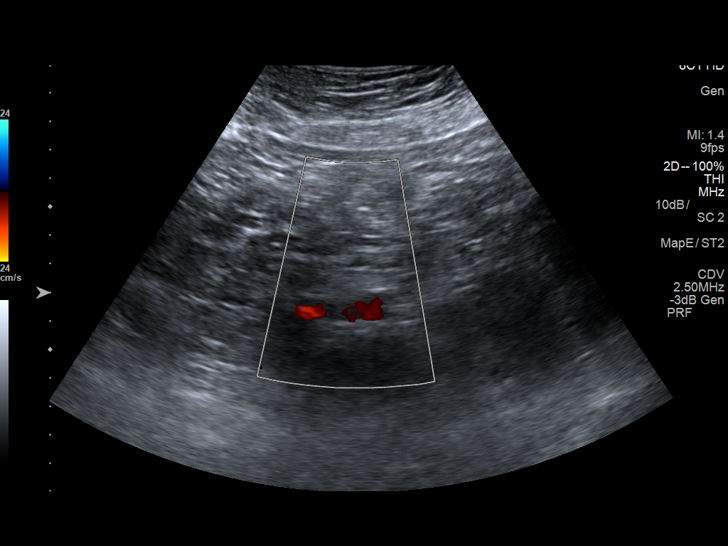

[14 of 25 positions shown; findings below may reference images not displayed]

FINDINGS: Gallbladder: No gallstones or wall thickening visualized. There is
no pericholecystic fluid. No sonographic Murphy sign noted by
sonographer.

Common bile duct: Diameter: 5 mm. No intrahepatic, common hepatic,
or common bile duct dilatation.

Liver: No focal lesion identified. Within normal limits in
parenchymal echogenicity. Portal vein is patent on color Doppler
imaging with normal direction of blood flow towards the liver.

IVC: No abnormality visualized.

Pancreas: Visualized portion unremarkable. Portions of the tail of
the pancreas are obscured by gas.

Spleen: Size and appearance within normal limits.

Right Kidney: Length: 10.6 cm. Echogenicity within normal limits. No
mass or hydronephrosis visualized.

Left Kidney: Length: 11.4 cm. Echogenicity within normal limits. No
mass or hydronephrosis visualized.

Abdominal aorta: No aneurysm visualized.

Other findings: No demonstrable ascites.
IMPRESSION: 1. Portions of pancreas obscured by gas. Visualized portions of
pancreas appear normal.

2.  Study otherwise unremarkable.

## 2019-09-01 ENCOUNTER — Ambulatory Visit: Payer: Self-pay | Admitting: Family Medicine

## 2019-12-01 ENCOUNTER — Other Ambulatory Visit: Payer: Self-pay

## 2019-12-01 ENCOUNTER — Telehealth (INDEPENDENT_AMBULATORY_CARE_PROVIDER_SITE_OTHER): Payer: BC Managed Care – PPO | Admitting: Family Medicine

## 2019-12-01 DIAGNOSIS — J4540 Moderate persistent asthma, uncomplicated: Secondary | ICD-10-CM

## 2019-12-01 MED ORDER — FLUTICASONE-SALMETEROL 250-50 MCG/DOSE IN AEPB: 1.0000 | INHALATION_SPRAY | Freq: Two times a day (BID) | RESPIRATORY_TRACT | 11 refills | Status: DC

## 2019-12-01 NOTE — Progress Notes (Signed)
This visit type was conducted due to national recommendations for restrictions regarding the COVID-19 pandemic in an effort to limit this patient's exposure and mitigate transmission in our community.   Virtual Visit via Video Note  I connected with [name] on 12/01/19 at  8:45 AM EST by a video enabled telemedicine application and verified that I am speaking with the correct person using two identifiers.  Location patient: home Location provider:work or home office Persons participating in the virtual visit: patient, provider  I discussed the limitations of evaluation and management by telemedicine and the availability of in person appointments. The patient expressed understanding and agreed to proceed.   HPI:  [name] is seen to discuss medications.  She has history of persistent asthma and has had some severe exacerbations in the past.  She lost her job last year and for a while did not have insurance.  She now has a new job which is working out well.  She is working from home.  She for a while was off of her medications but is now back on her inhaler.  She has taken Advair in the past and would like to get back consistently on that.  No recent asthma exacerbations.  She has great concerns about getting Covid.  She is staying fairly isolated and working from home.  She is been taking vitamin D and zinc supplements daily.  Generally feels well this time.  No other complaints.   ROS: See pertinent positives and negatives per HPI.  Past Medical History:  Diagnosis Date  . Asthma   . Blood transfusion   . Depression   . GERD (gastroesophageal reflux disease)   . Hepatitis A   . Hypertension   . Jaundice   . Migraines   . Ovarian cyst   . UTI (urinary tract infection)     Past Surgical History:  Procedure Laterality Date  . APPENDECTOMY  1994  . OPEN REDUCTION INTERNAL FIXATION (ORIF) METACARPAL Right 08/15/2016   Procedure: OPEN REDUCTION INTERNAL FIXATION (ORIF) right  ring finger middle phalanx fracture;  Surgeon: Betha Loa, MD;  Location: [facility];  Service: Orthopedics;  Laterality: Right;  . OVARIAN CYST REMOVAL    . TONSILLECTOMY AND ADENOIDECTOMY      Family History  Problem Relation Age of Onset  . Asthma Son   . Hypertension Other   . Crohn's disease Father   . Crohn's disease Maternal Aunt   . Esophageal cancer Maternal Uncle   . Breast cancer Maternal Grandmother     SOCIAL HX: Non-smoker   Current Outpatient Medications:  .  albuterol (PROVENTIL HFA;VENTOLIN HFA) 108 (90 Base) MCG/ACT inhaler, Inhale 2 puffs into the lungs every 6 (six) hours as needed for wheezing or shortness of breath., Disp: 1 Inhaler, Rfl: 2 .  albuterol (PROVENTIL) (2.5 MG/3ML) 0.083% nebulizer solution, Take 3 mLs (2.5 mg total) by nebulization every 6 (six) hours as needed for wheezing or shortness of breath., Disp: 75 mL, Rfl: 12 .  cetirizine (ZYRTEC) 10 MG tablet, Take 1 tablet (10 mg total) by mouth daily. (Patient not taking: Reported on 10/14/2018), Disp: 30 tablet, Rfl: 0 .  esomeprazole (NEXIUM) 40 MG capsule, Take 40 mg by mouth daily at 12 noon., Disp: , Rfl:  .  FLUoxetine (PROZAC) 40 MG capsule, Take 40 mg by mouth daily., Disp: , Rfl: 1 .  Fluticasone-Salmeterol (ADVAIR DISKUS) 250-50 MCG/DOSE AEPB, Inhale 1 puff into the lungs 2 (two) times daily., Disp: 60 each, Rfl: 11 .  losartan-hydrochlorothiazide (HYZAAR) 100-12.5 MG tablet, TAKE 1 TABLET BY MOUTH ONCE DAILY, Disp: 90 tablet, Rfl: 2 .  naproxen sodium (ALEVE) 220 MG tablet, Take 440 mg by mouth as needed (pain)., Disp: , Rfl:  .  predniSONE (STERAPRED UNI-PAK 21 TAB) 10 MG (21) TBPK tablet, Take as directed, Disp: 21 tablet, Rfl: 0  EXAM:  VITALS per patient if applicable:  GENERAL: alert, oriented, appears well and in no acute distress  HEENT: atraumatic, conjunttiva clear, no obvious abnormalities on inspection of external nose and ears  NECK: normal movements of the  head and neck  LUNGS: on inspection no signs of respiratory distress, breathing rate appears normal, no obvious gross SOB, gasping or wheezing  CV: no obvious cyanosis  MS: moves all visible extremities without noticeable abnormality  PSYCH/NEURO: pleasant and cooperative, no obvious depression or anxiety, speech and thought processing grossly intact  ASSESSMENT AND PLAN:  Discussed the following assessment and plan:  Moderate persistent asthma.  Currently controlled without acute exacerbation.  -We discussed issues with Covid prevention.  She is interested in getting vaccine as early as possible. -Refilled her Advair for 1 year.  Continue rescue inhaler as needed.     I discussed the assessment and treatment plan with the patient. The patient was provided an opportunity to ask questions and all were answered. The patient agreed with the plan and demonstrated an understanding of the instructions.   The patient was advised to call back or seek an in-person evaluation if the symptoms worsen or if the condition fails to improve as anticipated.     Carolann Littler, MD

## 2019-12-16 IMAGING — CT CT ABD-PELV W/ CM
2 of 5 series · 17 of 46 positions shown, 19 images · IV contrast (Omni 300)
Comparison: Ultrasound earlier today

CLINICAL DATA: Abdominal pain

EXAM:
CT ABDOMEN AND PELVIS WITH CONTRAST
TECHNIQUE: Multidetector CT imaging of the abdomen and pelvis was performed
using the standard protocol following bolus administration of
intravenous contrast.
CONTRAST:  100mL DLN6OB-FYY IOPAMIDOL (DLN6OB-FYY) INJECTION 61%

[Series 3: a/p w/ 5mm · axial · 0.97mm/px · z∈[+895,+1325]mm · 14 of 98 slices shown, 16 images]
[im 6/98  soft-tissue]
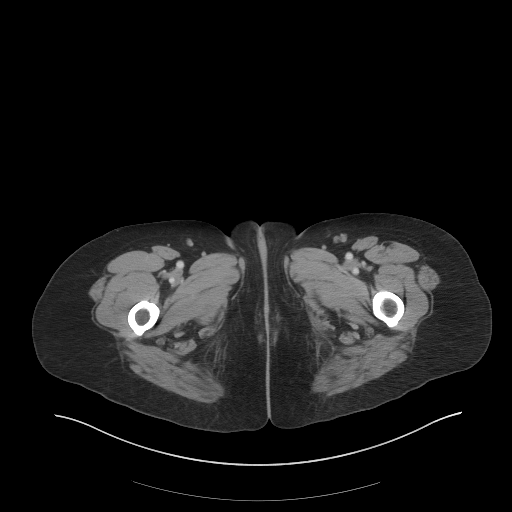
[im 6/98  bone]
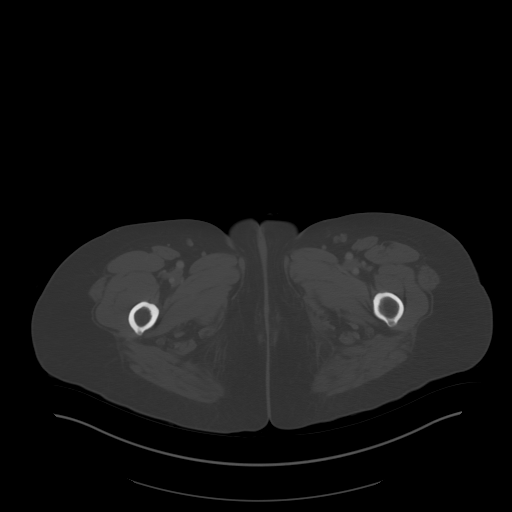
[im 11/98  soft-tissue]
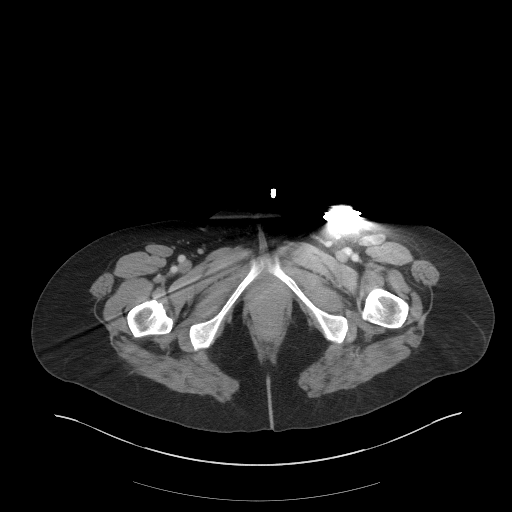
[im 22/98  soft-tissue]
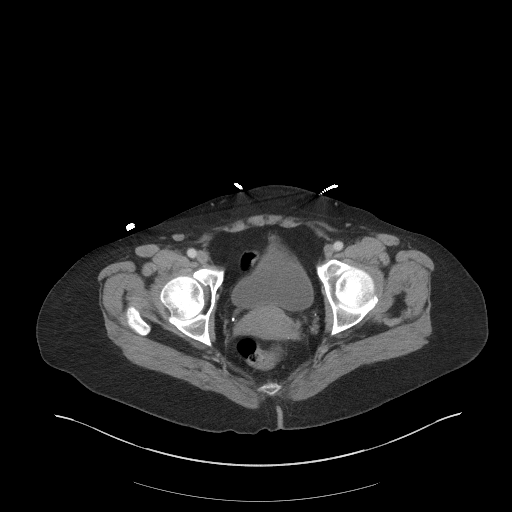
[im 27/98  soft-tissue]
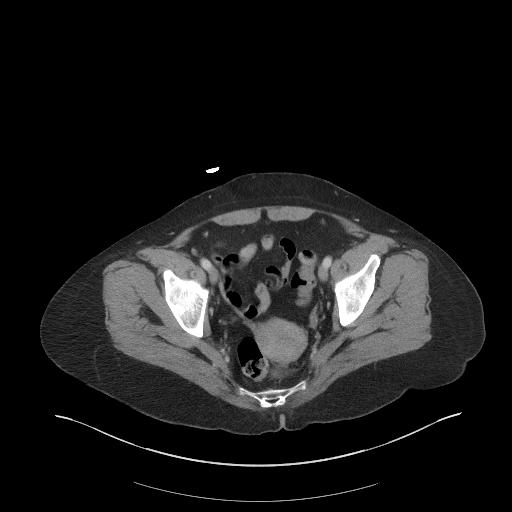
[im 33/98  soft-tissue]
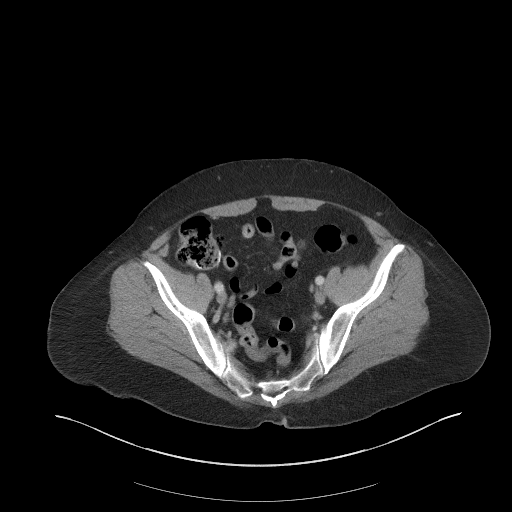
[im 38/98  soft-tissue]
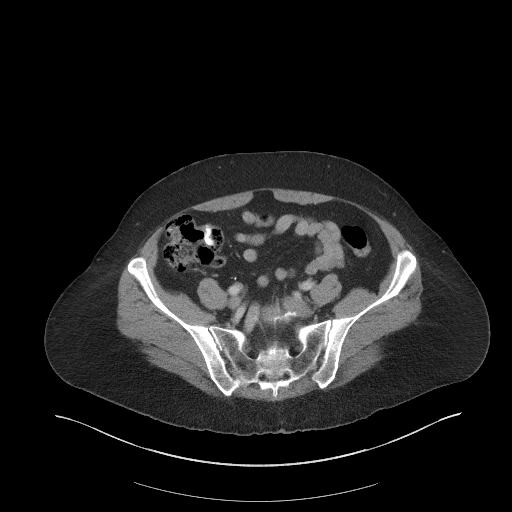
[im 44/98  soft-tissue]
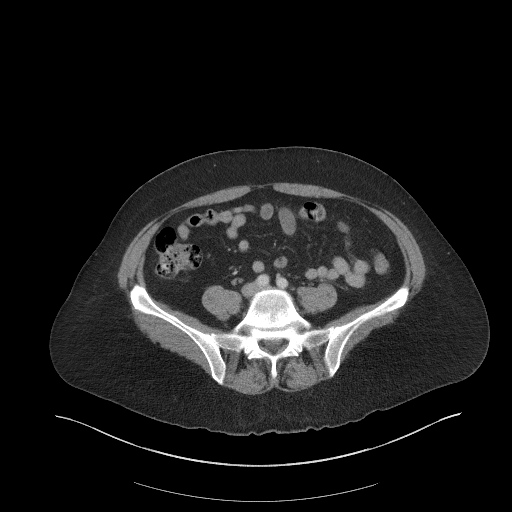
[im 54/98  soft-tissue]
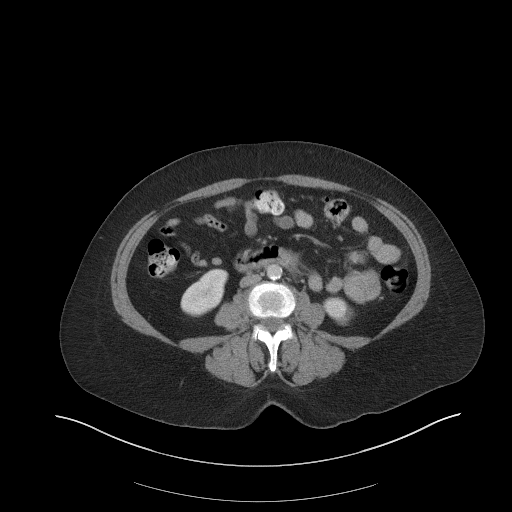
[im 60/98  soft-tissue]
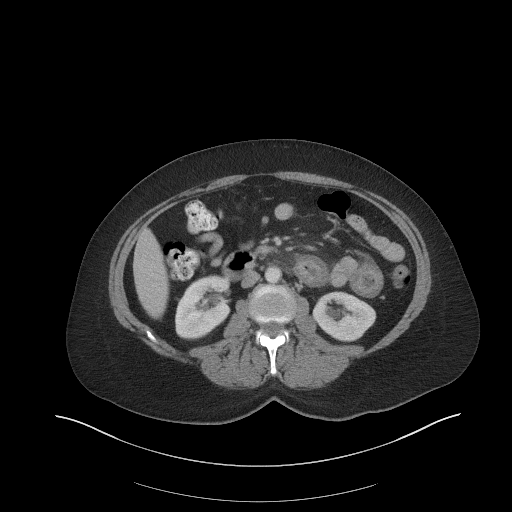
[im 60/98  bone]
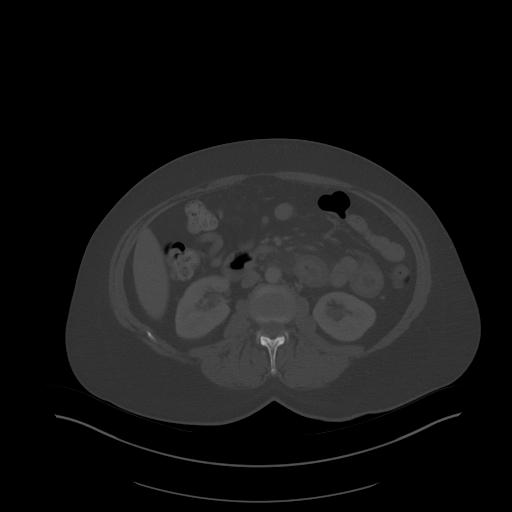
[im 65/98  soft-tissue]
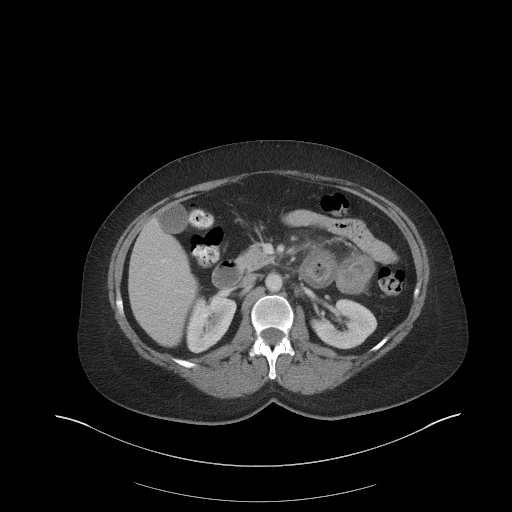
[im 71/98  soft-tissue]
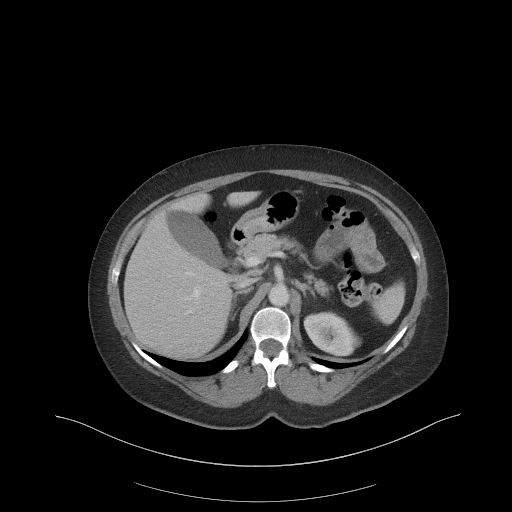
[im 76/98  soft-tissue]
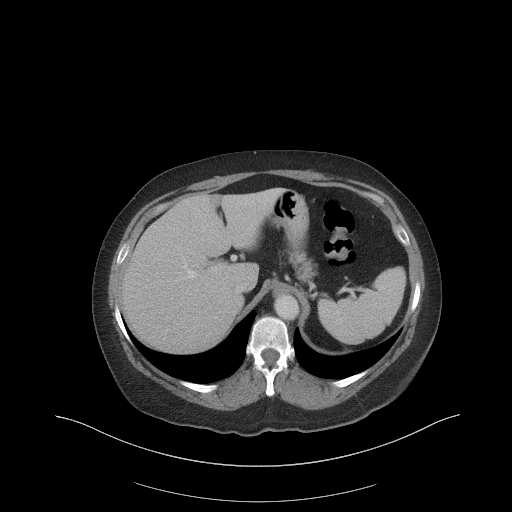
[im 87/98  soft-tissue]
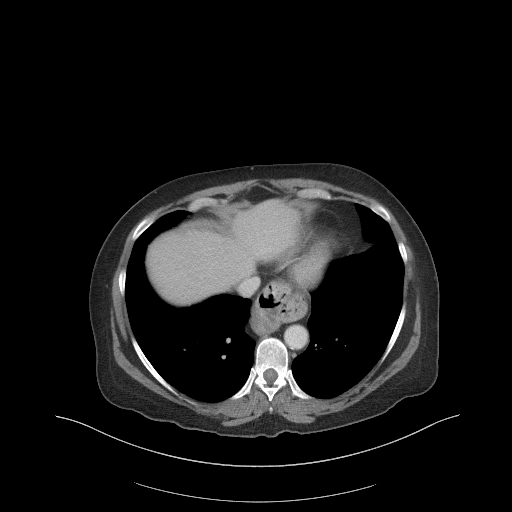
[im 92/98  soft-tissue]
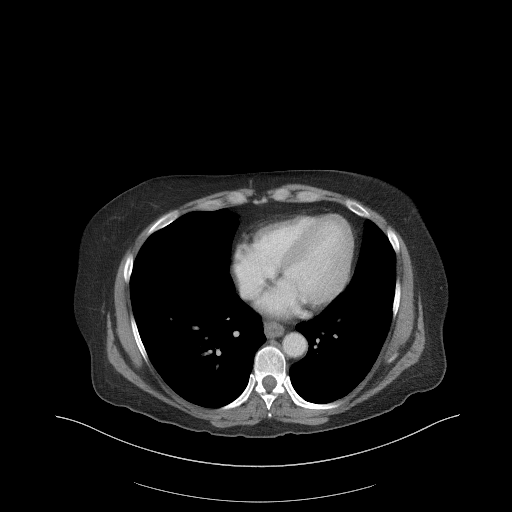

[Series 6: a/p w/ cor · coronal · 1.01mm/px · 3 of 196 slices shown]
[im 66/196  soft-tissue]
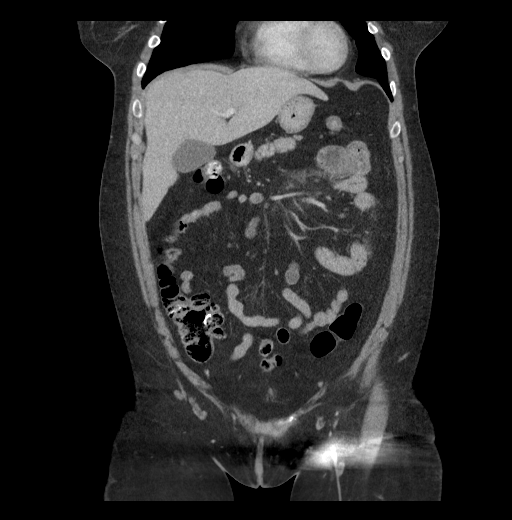
[im 87/196  soft-tissue]
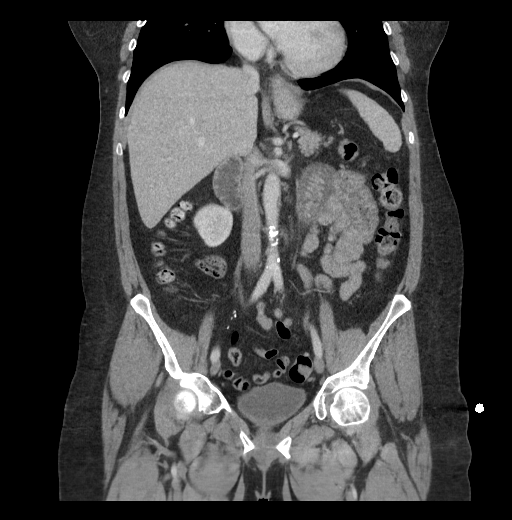
[im 109/196  soft-tissue]
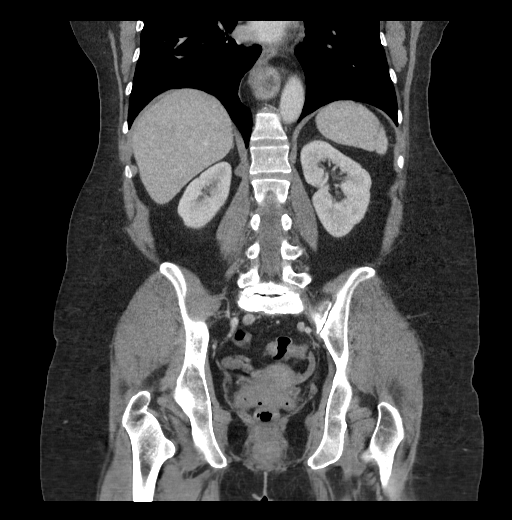

[17 of 46 positions shown; findings below may reference images not displayed]

FINDINGS: Lower chest: Moderate-sized hiatal hernia. Lung bases are clear. No
effusions. Heart is normal size.

Hepatobiliary: No focal hepatic abnormality. Gallbladder
unremarkable.

Pancreas: No focal abnormality or ductal dilatation.

Spleen: No focal abnormality.  Normal size.

Adrenals/Urinary Tract: No adrenal abnormality. No focal renal
abnormality. No stones or hydronephrosis. Urinary bladder is
unremarkable.

Stomach/Bowel: Abnormal appearance of the 4th portion of the
duodenum to the ligament of Treitz with circumferential wall
thickening and surrounding inflammation/stranding. No evidence of
bowel obstruction. Remainder of the small bowel, stomach and large
bowel unremarkable. Prior appendectomy.

Vascular/Lymphatic: Aortic atherosclerosis. No enlarged abdominal or
pelvic lymph nodes.

Reproductive: Uterus and adnexa unremarkable.  No mass.

Other: No free fluid or free air.

Musculoskeletal: No acute bony abnormality. Degenerative disc
disease at L5-S1. Bilateral L5 pars defects with grade 1
anterolisthesis.
IMPRESSION: Abnormal appearance of the distal duodenum with circumferential wall
thickening and surrounding inflammation. Findings compatible with
infectious or inflammatory enteritis.

Moderate-sized hiatal hernia.

Aortic atherosclerosis.

## 2020-01-05 ENCOUNTER — Other Ambulatory Visit: Payer: Self-pay | Admitting: Family Medicine

## 2021-01-18 ENCOUNTER — Ambulatory Visit (HOSPITAL_COMMUNITY)
Admission: EM | Admit: 2021-01-18 | Discharge: 2021-01-18 | Disposition: A | Discharge status: Home / Self Care | Payer: Commercial Managed Care - PPO | Attending: Family Medicine | Admitting: Family Medicine

## 2021-01-18 ENCOUNTER — Encounter (HOSPITAL_COMMUNITY): Payer: Self-pay | Admitting: Emergency Medicine

## 2021-01-18 ENCOUNTER — Other Ambulatory Visit: Payer: Self-pay

## 2021-01-18 DIAGNOSIS — R062 Wheezing: Secondary | ICD-10-CM | POA: Diagnosis not present

## 2021-01-18 DIAGNOSIS — R0602 Shortness of breath: Secondary | ICD-10-CM

## 2021-01-18 DIAGNOSIS — J4521 Mild intermittent asthma with (acute) exacerbation: Secondary | ICD-10-CM | POA: Diagnosis not present

## 2021-01-18 DIAGNOSIS — J3089 Other allergic rhinitis: Secondary | ICD-10-CM | POA: Diagnosis not present

## 2021-01-18 MED ORDER — ALBUTEROL SULFATE HFA 108 (90 BASE) MCG/ACT IN AERS
INHALATION_SPRAY | RESPIRATORY_TRACT | Status: AC
  Filled 2021-01-18: qty 6.7

## 2021-01-18 MED ORDER — DEXAMETHASONE SODIUM PHOSPHATE 10 MG/ML IJ SOLN
10.0000 mg | Freq: Once | INTRAMUSCULAR | Status: AC
  Administered 2021-01-18: 10 mg via INTRAMUSCULAR

## 2021-01-18 MED ORDER — CETIRIZINE HCL 10 MG PO TABS: 10.0000 mg | ORAL_TABLET | Freq: Every day | ORAL | 0 refills | Status: DC

## 2021-01-18 MED ORDER — FLUTICASONE-SALMETEROL 250-50 MCG/DOSE IN AEPB: 1.0000 | INHALATION_SPRAY | Freq: Two times a day (BID) | RESPIRATORY_TRACT | 0 refills | Status: DC

## 2021-01-18 MED ORDER — FLUTICASONE PROPIONATE 50 MCG/ACT NA SUSP: 1.0000 | Freq: Two times a day (BID) | NASAL | 0 refills | Status: DC

## 2021-01-18 MED ORDER — DEXAMETHASONE SODIUM PHOSPHATE 10 MG/ML IJ SOLN
INTRAMUSCULAR | Status: AC
  Filled 2021-01-18: qty 1

## 2021-01-18 MED ORDER — ALBUTEROL SULFATE HFA 108 (90 BASE) MCG/ACT IN AERS
2.0000 | INHALATION_SPRAY | Freq: Once | RESPIRATORY_TRACT | Status: AC
  Administered 2021-01-18: 2 via RESPIRATORY_TRACT

## 2021-01-18 NOTE — ED Triage Notes (Addendum)
Patient has asthma.  Reports symptoms came on within 24 hours.  Patient started with cough, audible wheezing.  Patient reports inhalers are not helping.  Patient states this feels like a lung infection.  Patient says this illness came on abruptly.  Patient did a home covid test yesterday and it was negative.    Patient had a change in jobs and insurance.  Patient has not been on any medicines in more than a month

## 2021-01-18 NOTE — ED Provider Notes (Signed)
[facility]    CSN: [id number] Arrival date & time: 01/18/21  1004      History   Chief Complaint Chief Complaint  Patient presents with  . Shortness of Breath    HPI [name] is a 55 y.o. female.   Patient here today with about 1 day history of cough, shortness of breath, wheezing, chest tightness.  She denies fever, chills, sore throat, nasal congestion, headache, body aches, abdominal pain.  No known sick contacts.  Lifelong history of asthma and seasonal allergies, states she lost her insurance few months ago and has not been on her prescriptions in this time.  She is typically managed on Zyrtec, Advair, albuterol as needed with good maintenance.  Has not been on them in months.     Past Medical History:  Diagnosis Date  . Asthma   . Blood transfusion   . Depression   . GERD (gastroesophageal reflux disease)   . Hepatitis A   . Hypertension   . Jaundice   . Migraines   . Ovarian cyst   . UTI (urinary tract infection)     Patient Active Problem List   Diagnosis Date Noted  . Asthma exacerbation 08/04/2017  . Respiratory distress   . ALLERGIC RHINITIS 01/29/2010  . Attention deficit disorder 12/14/2009  . Anxiety state 10/23/2009  . Adjustment disorder with mixed anxiety and depressed mood 05/25/2009  . Hypertension 05/25/2009  . Asthma 05/25/2009  . GERD 05/25/2009    Past Surgical History:  Procedure Laterality Date  . APPENDECTOMY  1994  . OPEN REDUCTION INTERNAL FIXATION (ORIF) METACARPAL Right 08/15/2016   Procedure: OPEN REDUCTION INTERNAL FIXATION (ORIF) right ring finger middle phalanx fracture;  Surgeon: Betha Loa, MD;  Location: [facility];  Service: Orthopedics;  Laterality: Right;  . OVARIAN CYST REMOVAL    . TONSILLECTOMY AND ADENOIDECTOMY      OB History   No obstetric history on file.      Home Medications    Prior to Admission medications   Medication Sig Start Date End Date Taking?  Authorizing Provider  albuterol (PROVENTIL HFA;VENTOLIN HFA) 108 (90 Base) MCG/ACT inhaler Inhale 2 puffs into the lungs every 6 (six) hours as needed for wheezing or shortness of breath. 01/14/19  Yes Philip Aspen, Limmie Patricia, MD  fluticasone [facility]) 50 MCG/ACT nasal spray Place 1 spray into both nostrils in the morning and at bedtime. 01/18/21  Yes Particia Nearing, PA-C  albuterol (PROVENTIL) (2.5 MG/3ML) 0.083% nebulizer solution Take 3 mLs (2.5 mg total) by nebulization every 6 (six) hours as needed for wheezing or shortness of breath. 01/14/19   Philip Aspen, Limmie Patricia, MD  cetirizine (ZYRTEC) 10 MG tablet Take 1 tablet (10 mg total) by mouth daily. 01/18/21   Particia Nearing, PA-C  esomeprazole (NEXIUM) 40 MG capsule Take 40 mg by mouth daily at 12 noon. Patient not taking: Reported on 01/18/2021    [provider]  FLUoxetine (PROZAC) 40 MG capsule Take 40 mg by mouth daily. 10/05/18   [provider]  Fluticasone-Salmeterol (ADVAIR DISKUS) 250-50 MCG/DOSE AEPB Inhale 1 puff into the lungs 2 (two) times daily. 01/18/21   Particia Nearing, PA-C  losartan-hydrochlorothiazide (HYZAAR) 100-12.5 MG tablet TAKE 1 TABLET BY MOUTH ONCE DAILY Patient not taking: Reported on 01/18/2021 02/26/19   Kristian Covey, MD  naproxen sodium (ALEVE) 220 MG tablet Take 440 mg by mouth as needed (pain).    [provider]  predniSONE Albesa Seen  UNI-PAK 21 TAB) 10 MG (21) TBPK tablet Take as directed 01/14/19   Philip Aspen, Limmie Patricia, MD  QVAR REDIHALER 80 MCG/ACT inhaler INHALE 1 PUFF INTO THE LUNGS TWICE DAILY 01/06/20   Burchette, Elberta Fortis, MD    Family History Family History  Problem Relation Age of Onset  . Asthma Son   . Hypertension Other   . Crohn's disease Father   . Crohn's disease Maternal Aunt   . Esophageal cancer Maternal Uncle   . Breast cancer Maternal Grandmother     Social History Social History   Tobacco Use  . Smoking status: Never  Smoker  . Smokeless tobacco: Never Used  Vaping Use  . Vaping Use: Never used  Substance Use Topics  . Alcohol use: Yes    Comment: several times monthly  . Drug use: No     Allergies   Aspirin, Codeine sulfate, and Hydrocodone-acetaminophen   Review of Systems Review of Systems Per HPI  Physical Exam Triage Vital Signs ED Triage Vitals  Enc Vitals Group     BP 01/18/21 1044 (!) 189/96     Pulse Rate 01/18/21 1044 77     Resp 01/18/21 1044 (!) 24     Temp 01/18/21 1044 98 F (36.7 C)     Temp Source 01/18/21 1044 Temporal     SpO2 01/18/21 1044 99 %     Weight --      Height --      Head Circumference --      Peak Flow --      Pain Score 01/18/21 1039 0     Pain Loc --      Pain Edu? --      Excl. in GC? --    No data found.  Updated Vital Signs BP (!) 189/96 (BP Location: Right Arm)   Pulse 77   Temp 98 F (36.7 C) (Temporal)   Resp (!) 24   SpO2 99%   Visual Acuity Right Eye Distance:   Left Eye Distance:   Bilateral Distance:    Right Eye Near:   Left Eye Near:    Bilateral Near:     Physical Exam Vitals and nursing note reviewed.  Constitutional:      Appearance: Normal appearance. She is not ill-appearing.  HENT:     Head: Atraumatic.     Right Ear: Tympanic membrane normal.     Left Ear: Tympanic membrane normal.     Nose: Nose normal.     Mouth/Throat:     Mouth: Mucous membranes are moist.     Pharynx: Posterior oropharyngeal erythema present.  Eyes:     Extraocular Movements: Extraocular movements intact.     Conjunctiva/sclera: Conjunctivae normal.  Cardiovascular:     Rate and Rhythm: Normal rate and regular rhythm.     Heart sounds: Normal heart sounds.  Pulmonary:     Effort: Pulmonary effort is normal. No respiratory distress.     Breath sounds: Wheezing present. No rales.     Comments: Moderate diffuse wheezes bilaterally Speaking in full sentences comfortably without distress on room air Musculoskeletal:        General:  Normal range of motion.     Cervical back: Normal range of motion and neck supple.  Skin:    General: Skin is warm and dry.  Neurological:     Mental Status: She is alert and oriented to person, place, and time.  Psychiatric:        Mood and  Affect: Mood normal.        Thought Content: Thought content normal.        Judgment: Judgment normal.      UC Treatments / Results  Labs (all labs ordered are listed, but only abnormal results are displayed) Labs Reviewed - No data to display  EKG   Radiology No results found.  Procedures Procedures (including critical care time)  Medications Ordered in UC Medications  dexamethasone (DECADRON) injection 10 mg (has no administration in time range)  albuterol (VENTOLIN HFA) 108 (90 Base) MCG/ACT inhaler 2 puff (2 puffs Inhalation Given 01/18/21 1059)    Initial Impression / Assessment and Plan / UC Course  I have reviewed the triage vital signs and the nursing notes.  Pertinent labs & imaging results that were available during my care of the patient were reviewed by me and considered in my medical decision making (see chart for details).     Moderate improvement after albuterol inhaler given in triage.  Will give IM Decadron and refill her Zyrtec, Advair, Flonase.  Work note given.  Home Covid test per patient was negative today.  Return for acutely worsening symptoms.  Follow-up with PCP for recheck.  Final Clinical Impressions(s) / UC Diagnoses   Final diagnoses:  Mild intermittent asthma with exacerbation  SOB (shortness of breath)  Wheezing  Seasonal allergic rhinitis due to other allergic trigger   Discharge Instructions   None    ED Prescriptions    Medication Sig Dispense Auth. Provider   cetirizine (ZYRTEC) 10 MG tablet Take 1 tablet (10 mg total) by mouth daily. 30 tablet Particia Nearing, [place]   Fluticasone-Salmeterol (ADVAIR DISKUS) 250-50 MCG/DOSE AEPB Inhale 1 puff into the lungs 2 (two) times daily. 60  each Particia Nearing, PA-C   fluticasone [facility]) 50 MCG/ACT nasal spray Place 1 spray into both nostrils in the morning and at bedtime. 16 g Particia Nearing, [place]     PDMP not reviewed this encounter.   Particia Nearing, [place] 01/18/21 1137

## 2021-03-11 ENCOUNTER — Other Ambulatory Visit: Payer: Self-pay

## 2021-03-11 ENCOUNTER — Encounter (HOSPITAL_COMMUNITY): Payer: Self-pay

## 2021-03-11 ENCOUNTER — Ambulatory Visit (HOSPITAL_COMMUNITY)
Admission: EM | Admit: 2021-03-11 | Discharge: 2021-03-11 | Disposition: A | Discharge status: Home / Self Care | Payer: Commercial Managed Care - PPO

## 2021-03-11 DIAGNOSIS — J4541 Moderate persistent asthma with (acute) exacerbation: Secondary | ICD-10-CM | POA: Diagnosis not present

## 2021-03-11 DIAGNOSIS — L2084 Intrinsic (allergic) eczema: Secondary | ICD-10-CM

## 2021-03-11 DIAGNOSIS — J301 Allergic rhinitis due to pollen: Secondary | ICD-10-CM

## 2021-03-11 MED ORDER — PREDNISONE 10 MG (21) PO TBPK: ORAL_TABLET | Freq: Every day | ORAL | 0 refills | Status: DC

## 2021-03-11 NOTE — ED Provider Notes (Signed)
[facility]    CSN: [id number] Arrival date & time: 03/11/21  1252      History   Chief Complaint Chief Complaint  Patient presents with  . Shortness of Breath  . Cough    HPI [name] is a 55 y.o. female presenting with shortness of breath and cough for 2 days.  Medical history of asthma for which she takes albuterol and Advair inhalers, she states these are providing no relief.  She takes daily Zyrtec for allergic rhinitis component.  States her allergies have been really acting up, and her eczema is also bothering her.  Minimal relief in eczema with Zyrtec.  Has not tried any other medications for her symptoms. Denies fevers/chills, n/v/d, shortness of breath, chest pain, facial pain, teeth pain, headaches, sore throat, loss of taste/smell, swollen lymph nodes, ear pain.    HPI  Past Medical History:  Diagnosis Date  . Asthma   . Blood transfusion   . Depression   . GERD (gastroesophageal reflux disease)   . Hepatitis A   . Hypertension   . Jaundice   . Migraines   . Ovarian cyst   . UTI (urinary tract infection)     Patient Active Problem List   Diagnosis Date Noted  . Asthma exacerbation 08/04/2017  . Respiratory distress   . ALLERGIC RHINITIS 01/29/2010  . Attention deficit disorder 12/14/2009  . Anxiety state 10/23/2009  . Adjustment disorder with mixed anxiety and depressed mood 05/25/2009  . Hypertension 05/25/2009  . Asthma 05/25/2009  . GERD 05/25/2009    Past Surgical History:  Procedure Laterality Date  . APPENDECTOMY  1994  . OPEN REDUCTION INTERNAL FIXATION (ORIF) METACARPAL Right 08/15/2016   Procedure: OPEN REDUCTION INTERNAL FIXATION (ORIF) right ring finger middle phalanx fracture;  Surgeon: Betha LoaKevin Kuzma, MD;  Location: [facility];  Service: Orthopedics;  Laterality: Right;  . OVARIAN CYST REMOVAL    . TONSILLECTOMY AND ADENOIDECTOMY      OB History   No obstetric history on file.      Home  Medications    Prior to Admission medications   Medication Sig Start Date End Date Taking? Authorizing Provider  fluticasone-salmeterol (ADVAIR) 100-50 MCG/ACT AEPB Inhale 1 puff into the lungs 2 (two) times daily.   Yes [provider]  predniSONE (STERAPRED UNI-PAK 21 TAB) 10 MG (21) TBPK tablet Take by mouth daily. Take 6 tabs by mouth daily  for 2 days, then 5 tabs for 2 days, then 4 tabs for 2 days, then 3 tabs for 2 days, 2 tabs for 2 days, then 1 tab by mouth daily for 2 days 03/11/21  Yes Cheree DittoGraham, Lyman SpellerLaura E, PA-C  albuterol (PROVENTIL HFA;VENTOLIN HFA) 108 (90 Base) MCG/ACT inhaler Inhale 2 puffs into the lungs every 6 (six) hours as needed for wheezing or shortness of breath. 01/14/19   Philip AspenHernandez Acosta, Limmie PatriciaEstela Y, MD  albuterol (PROVENTIL) (2.5 MG/3ML) 0.083% nebulizer solution Take 3 mLs (2.5 mg total) by nebulization every 6 (six) hours as needed for wheezing or shortness of breath. 01/14/19   Philip AspenHernandez Acosta, Limmie PatriciaEstela Y, MD  cetirizine (ZYRTEC) 10 MG tablet Take 1 tablet (10 mg total) by mouth daily. 01/18/21   Particia NearingLane, Rachel Elizabeth, PA-C  FLUoxetine (PROZAC) 40 MG capsule Take 40 mg by mouth daily. 10/05/18   [provider]  fluticasone (FLONASE) 50 MCG/ACT nasal spray Place 1 spray into both nostrils in the morning and at bedtime. 01/18/21   Particia NearingLane, Rachel Elizabeth, PA-C  Fluticasone-Salmeterol (ADVAIR DISKUS) 250-50 MCG/DOSE AEPB Inhale 1 puff into the lungs 2 (two) times daily. 01/18/21   Particia Nearing, PA-C  naproxen sodium (ALEVE) 220 MG tablet Take 440 mg by mouth as needed (pain).    [provider]  QVAR REDIHALER 80 MCG/ACT inhaler INHALE 1 PUFF INTO THE LUNGS TWICE DAILY 01/06/20   Burchette, Elberta Fortis, MD  esomeprazole (NEXIUM) 40 MG capsule Take 40 mg by mouth daily at 12 noon. Patient not taking: No sig reported  03/11/21  [provider]  losartan-hydrochlorothiazide (HYZAAR) 100-12.5 MG tablet TAKE 1 TABLET BY MOUTH ONCE DAILY Patient not  taking: Reported on 01/18/2021 02/26/19 03/11/21  Kristian Covey, MD    Family History Family History  Problem Relation Age of Onset  . Asthma Son   . Hypertension Other   . Crohn's disease Father   . Crohn's disease Maternal Aunt   . Esophageal cancer Maternal Uncle   . Breast cancer Maternal Grandmother     Social History Social History   Tobacco Use  . Smoking status: Never Smoker  . Smokeless tobacco: Never Used  Vaping Use  . Vaping Use: Never used  Substance Use Topics  . Alcohol use: Yes    Comment: several times monthly  . Drug use: No     Allergies   Aspirin, Codeine sulfate, and Hydrocodone-acetaminophen   Review of Systems Review of Systems  Constitutional: Negative for appetite change, chills and fever.  HENT: Positive for congestion. Negative for ear pain, rhinorrhea, sinus pressure, sinus pain and sore throat.   Eyes: Negative for redness and visual disturbance.  Respiratory: Positive for cough, shortness of breath and wheezing. Negative for chest tightness.   Cardiovascular: Negative for chest pain and palpitations.  Gastrointestinal: Negative for abdominal pain, constipation, diarrhea, nausea and vomiting.  Genitourinary: Negative for dysuria, frequency and urgency.  Musculoskeletal: Negative for myalgias.  Neurological: Negative for dizziness, weakness and headaches.  Psychiatric/Behavioral: Negative for confusion.  All other systems reviewed and are negative.    Physical Exam Triage Vital Signs ED Triage Vitals  Enc Vitals Group     BP --      Pulse Rate 03/11/21 1259 88     Resp 03/11/21 1259 (!) 25     Temp 03/11/21 1259 98.3 F (36.8 C)     Temp Source 03/11/21 1259 Oral     SpO2 03/11/21 1259 97 %     Weight --      Height --      Head Circumference --      Peak Flow --      Pain Score 03/11/21 1257 0     Pain Loc --      Pain Edu? --      Excl. in GC? --    No data found.  Updated Vital Signs Pulse 88   Temp 98.3 F (36.8  C) (Oral)   Resp (!) 25   SpO2 97%   Visual Acuity Right Eye Distance:   Left Eye Distance:   Bilateral Distance:    Right Eye Near:   Left Eye Near:    Bilateral Near:     Physical Exam Vitals reviewed.  Constitutional:      General: She is not in acute distress.    Appearance: Normal appearance. She is well-developed. She is not ill-appearing.  HENT:     Head: Normocephalic and atraumatic.     Right Ear: Hearing, tympanic membrane, ear canal and external ear normal. No  drainage, swelling or tenderness. No middle ear effusion. There is no impacted cerumen. No mastoid tenderness. Tympanic membrane is not perforated, erythematous, retracted or bulging.     Left Ear: Hearing, tympanic membrane, ear canal and external ear normal. No drainage, swelling or tenderness.  No middle ear effusion. There is no impacted cerumen. No mastoid tenderness. Tympanic membrane is not perforated, erythematous, retracted or bulging.     Nose: Nose normal.     Right Sinus: No maxillary sinus tenderness or frontal sinus tenderness.     Left Sinus: No maxillary sinus tenderness or frontal sinus tenderness.     Mouth/Throat:     Mouth: Mucous membranes are moist.     Pharynx: Uvula midline. No oropharyngeal exudate, posterior oropharyngeal erythema or uvula swelling.     Tonsils: No tonsillar exudate.  Cardiovascular:     Rate and Rhythm: Normal rate and regular rhythm.     Pulses:          Radial pulses are 2+ on the right side and 2+ on the left side.     Heart sounds: Normal heart sounds.  Pulmonary:     Effort: Pulmonary effort is normal. Tachypnea present. No accessory muscle usage, prolonged expiration, respiratory distress or retractions.     Breath sounds: Normal air entry. Wheezing present. No decreased breath sounds, rhonchi or rales.     Comments: Wheezes throughout Chest:     Chest wall: No tenderness.  Abdominal:     General: Abdomen is flat. Bowel sounds are normal.     Palpations:  Abdomen is soft.     Tenderness: There is no abdominal tenderness. There is no guarding or rebound. Negative signs include Murphy's sign and McBurney's sign.  Lymphadenopathy:     Cervical: No cervical adenopathy.  Neurological:     General: No focal deficit present.     Mental Status: She is alert and oriented to person, place, and time.  Psychiatric:        Attention and Perception: Attention and perception normal.        Mood and Affect: Mood and affect normal.        Behavior: Behavior normal. Behavior is cooperative.        Thought Content: Thought content normal.        Judgment: Judgment normal.      UC Treatments / Results  Labs (all labs ordered are listed, but only abnormal results are displayed) Labs Reviewed - No data to display  EKG   Radiology No results found.  Procedures Procedures (including critical care time)  Medications Ordered in UC Medications - No data to display  Initial Impression / Assessment and Plan / UC Course  I have reviewed the triage vital signs and the nursing notes.  Pertinent labs & imaging results that were available during my care of the patient were reviewed by me and considered in my medical decision making (see chart for details).     This patient is a 55 year old female presenting with asthma exacerbation related to allergic rhinitis. Today this pt is afebrile nontachycardic nontachypneic, oxygenating well on room air, wheezes throughout but no rhonchi or rales.   She declines COVID test. Prednisone taper for asthma exacerbation.  Continue Advair and albuterol inhalers. For eczema exacerbation, also prednisone as below. Continue Zyrtec for allergic rhinitis component.  ED return precautions discussed.   Final Clinical Impressions(s) / UC Diagnoses   Final diagnoses:  Moderate persistent asthma with acute exacerbation  Seasonal allergic rhinitis due  to pollen  Intrinsic atopic dermatitis     Discharge Instructions      -Prednisone taper for cough/asthma exacerbation. I recommend taking this in the morning as it could give you energy.  -Continue your albuterol and Advair inhalers. -Continue cetirizine (Zyrtec) for allergic rhinitis component.    ED Prescriptions    Medication Sig Dispense Auth. Provider   predniSONE (STERAPRED UNI-PAK 21 TAB) 10 MG (21) TBPK tablet Take by mouth daily. Take 6 tabs by mouth daily  for 2 days, then 5 tabs for 2 days, then 4 tabs for 2 days, then 3 tabs for 2 days, 2 tabs for 2 days, then 1 tab by mouth daily for 2 days 42 tablet Rhys Martini, PA-C     PDMP not reviewed this encounter.   Rhys Martini, PA-C 03/11/21 1424

## 2021-03-11 NOTE — ED Triage Notes (Signed)
Pt reports shortness of breath and cough x 2 days. Albuterol and Advair gives no relief.

## 2021-03-11 NOTE — Discharge Instructions (Addendum)
-  Prednisone taper for cough/asthma exacerbation. I recommend taking this in the morning as it could give you energy.  -Continue your albuterol and Advair inhalers. -Continue cetirizine (Zyrtec) for allergic rhinitis component.

## 2021-03-20 ENCOUNTER — Other Ambulatory Visit: Payer: Self-pay

## 2021-03-20 ENCOUNTER — Emergency Department (HOSPITAL_COMMUNITY)
Admission: EM | Admit: 2021-03-20 | Discharge: 2021-03-20 | Discharge status: Against Medical Advice | Payer: Commercial Managed Care - PPO

## 2021-03-20 NOTE — ED Notes (Signed)
Pt name called to be triage, no response

## 2021-03-20 NOTE — ED Notes (Signed)
Called for triage no answer  

## 2021-03-20 NOTE — ED Notes (Signed)
Pt name called for triage, still no response. Pt being pulled of the floor

## 2021-10-14 ENCOUNTER — Encounter (HOSPITAL_COMMUNITY): Payer: Self-pay

## 2021-10-14 ENCOUNTER — Emergency Department (HOSPITAL_COMMUNITY)
Admission: EM | Admit: 2021-10-14 | Discharge: 2021-10-14 | Disposition: A | Discharge status: Home / Self Care | Payer: BC Managed Care – PPO | Attending: Emergency Medicine | Admitting: Emergency Medicine

## 2021-10-14 ENCOUNTER — Other Ambulatory Visit: Payer: Self-pay

## 2021-10-14 DIAGNOSIS — R21 Rash and other nonspecific skin eruption: Secondary | ICD-10-CM | POA: Insufficient documentation

## 2021-10-14 DIAGNOSIS — I10 Essential (primary) hypertension: Secondary | ICD-10-CM | POA: Diagnosis not present

## 2021-10-14 DIAGNOSIS — J45909 Unspecified asthma, uncomplicated: Secondary | ICD-10-CM | POA: Insufficient documentation

## 2021-10-14 MED ORDER — IVERMECTIN 3 MG PO TABS: 200.0000 ug/kg | ORAL_TABLET | Freq: Once | ORAL | 1 refills | Status: AC

## 2021-10-14 MED ORDER — DIPHENHYDRAMINE HCL 25 MG PO CAPS
25.0000 mg | ORAL_CAPSULE | Freq: Once | ORAL | Status: AC
  Administered 2021-10-14: 25 mg via ORAL
  Filled 2021-10-14: qty 1

## 2021-10-14 MED ORDER — HYDROXYZINE HCL 25 MG PO TABS: 25.0000 mg | ORAL_TABLET | Freq: Four times a day (QID) | ORAL | 0 refills | Status: DC

## 2021-10-14 NOTE — ED Provider Notes (Signed)
Emergency Medicine Provider Triage Evaluation Note  [name] , a 55 y.o. female  was evaluated in triage.  Pt complains of rash.  Patient states that for 2 weeks she has had an itchy rash to the chest, back, arms and face.  She states that she had a rescue dog at the house who she thinks had fleas or mites.  She states she has seen "little clear bugs " on her arms previously.  She states that she was given permethrin cream which initially helped.  She states that it is now getting worse.  She denies any fevers.  She states the dog is now out of the house.  Review of Systems  Positive: See above Negative:   Physical Exam  There were no vitals taken for this visit. Gen:   Awake, no distress, anxious on exam Resp:  Normal effort  MSK:   Moves extremities without difficulty  Other:  Chest, back, bilateral upper extremities and face with innumerable maculopapular rash that is excoriated.  Medical Decision Making  Medically screening exam initiated at 12:55 PM.  Appropriate orders placed.  FERNANDO TORRY was informed that the remainder of the evaluation will be completed by another provider, this initial triage assessment does not replace that evaluation, and the importance of remaining in the ED until their evaluation is complete.     Cristopher Peru, PA-C 10/14/21 1257    Pollyann Savoy, MD 10/14/21 [phone]

## 2021-10-14 NOTE — Discharge Instructions (Addendum)
Take the medication as prescribed.  You may repeat the ivermectin dose if still having symptoms after 1 week.  Follow-up with primary care provider within the week for new or worsening symptoms.  Hydroxyzine for itching.

## 2021-10-14 NOTE — ED Notes (Signed)
Pt has medium pink bumps on arms, chest, back ad abdomen. All of the bumps are broken open from pt scratching them.

## 2021-10-14 NOTE — ED Triage Notes (Signed)
Patient with itchy rash to chest, neck and upper arms and trunk x 2 weeks. States that it all started after fostering dogs. Patient has been using cream as prescribed with no relief. No SOB

## 2021-10-14 NOTE — ED Provider Notes (Signed)
[facility] EMERGENCY DEPARTMENT Provider Note   CSN: [id number] Arrival date & time: 10/14/21  1240    History Chief Complaint  Patient presents with   Rash    [name] is a 55 y.o. female with medical history significant for ADHD who presents for evaluation of rash.  Took in a foster dog.  Since she taken the dog she noted possible small mites, rash.  She was seen a week and half ago at urgent care prescribed permethrin cream which she states helped for 3 days however she is still in persistent pruritus to rash.  She denies any rash mucous membranes, mouth.  Initially started off at her intertriginous webspaces, then went to her wrists, chest, inguinal crease and scattered around her back.  She denies any history of immunocompromise state, HIV.  No recent lotions, perfumes, medication changes.  She does not take any medications chronically.  There is no one else in her house.  She is nonpainful.  No known tick bites.  No spider bites.  No history of fever, nausea vomiting, chest pain, shortness of breath, IV drug use.  Denies additional aggravating or alleviating factors.  History obtained from patient and past medical records.  No interpreter is used.  HPI     Past Medical History:  Diagnosis Date   Asthma    Blood transfusion    Depression    GERD (gastroesophageal reflux disease)    Hepatitis A    Hypertension    Jaundice    Migraines    Ovarian cyst    UTI (urinary tract infection)     Patient Active Problem List   Diagnosis Date Noted   Asthma exacerbation 08/04/2017   Respiratory distress    ALLERGIC RHINITIS 01/29/2010   Attention deficit disorder 12/14/2009   Anxiety state 10/23/2009   Adjustment disorder with mixed anxiety and depressed mood 05/25/2009   Hypertension 05/25/2009   Asthma 05/25/2009   GERD 05/25/2009    Past Surgical History:  Procedure Laterality Date   APPENDECTOMY  1994   OPEN REDUCTION INTERNAL FIXATION  (ORIF) METACARPAL Right 08/15/2016   Procedure: OPEN REDUCTION INTERNAL FIXATION (ORIF) right ring finger middle phalanx fracture;  Surgeon: Leanora Cover, MD;  Location: [place];  Service: Orthopedics;  Laterality: Right;   OVARIAN CYST REMOVAL     TONSILLECTOMY AND ADENOIDECTOMY       OB History   No obstetric history on file.     Family History  Problem Relation Age of Onset   Asthma Son    Hypertension Other    Crohn's disease Father    Crohn's disease Maternal Aunt    Esophageal cancer Maternal Uncle    Breast cancer Maternal Grandmother     Social History   Tobacco Use   Smoking status: Never   Smokeless tobacco: Never  Vaping Use   Vaping Use: Never used  Substance Use Topics   Alcohol use: Yes    Comment: several times monthly   Drug use: No    Home Medications Prior to Admission medications   Medication Sig Start Date End Date Taking? Authorizing Provider  hydrOXYzine (ATARAX) 25 MG tablet Take 1 tablet (25 mg total) by mouth every 6 (six) hours. 10/14/21  Yes Jonette Wassel A, PA-C  ivermectin (STROMECTOL) 3 MG TABS tablet Take 5.5 tablets (16,500 mcg total) by mouth once for 1 dose. Repeat dosing if still have symptoms after 1 week 10/14/21 10/14/21 Yes Stormy Sabol A, PA-C  albuterol (PROVENTIL HFA;VENTOLIN HFA) 108 (90 Base) MCG/ACT inhaler Inhale 2 puffs into the lungs every 6 (six) hours as needed for wheezing or shortness of breath. 01/14/19   Philip Aspen, Limmie Patricia, MD  albuterol (PROVENTIL) (2.5 MG/3ML) 0.083% nebulizer solution Take 3 mLs (2.5 mg total) by nebulization every 6 (six) hours as needed for wheezing or shortness of breath. 01/14/19   Philip Aspen, Limmie Patricia, MD  cetirizine (ZYRTEC) 10 MG tablet Take 1 tablet (10 mg total) by mouth daily. 01/18/21   Particia Nearing, PA-C  FLUoxetine (PROZAC) 40 MG capsule Take 40 mg by mouth daily. 10/05/18   [provider]  fluticasone (FLONASE) 50 MCG/ACT nasal spray  Place 1 spray into both nostrils in the morning and at bedtime. 01/18/21   Particia Nearing, PA-C  Fluticasone-Salmeterol (ADVAIR DISKUS) 250-50 MCG/DOSE AEPB Inhale 1 puff into the lungs 2 (two) times daily. 01/18/21   Particia Nearing, PA-C  fluticasone-salmeterol (ADVAIR) 100-50 MCG/ACT AEPB Inhale 1 puff into the lungs 2 (two) times daily.    [provider]  naproxen sodium (ALEVE) 220 MG tablet Take 440 mg by mouth as needed (pain).    [provider]  predniSONE (STERAPRED UNI-PAK 21 TAB) 10 MG (21) TBPK tablet Take by mouth daily. Take 6 tabs by mouth daily  for 2 days, then 5 tabs for 2 days, then 4 tabs for 2 days, then 3 tabs for 2 days, 2 tabs for 2 days, then 1 tab by mouth daily for 2 days 03/11/21   Rhys Martini, PA-C  QVAR REDIHALER 80 MCG/ACT inhaler INHALE 1 PUFF INTO THE LUNGS TWICE DAILY 01/06/20   Burchette, Elberta Fortis, MD  esomeprazole (NEXIUM) 40 MG capsule Take 40 mg by mouth daily at 12 noon. Patient not taking: No sig reported  03/11/21  [provider]  losartan-hydrochlorothiazide (HYZAAR) 100-12.5 MG tablet TAKE 1 TABLET BY MOUTH ONCE DAILY Patient not taking: Reported on 01/18/2021 02/26/19 03/11/21  Kristian Covey, MD    Allergies    Aspirin, Codeine sulfate, and Hydrocodone-acetaminophen  Review of Systems   Review of Systems  Constitutional: Negative.   HENT: Negative.    Respiratory: Negative.    Cardiovascular: Negative.   Gastrointestinal: Negative.   Genitourinary: Negative.   Musculoskeletal: Negative.   Skin:  Positive for rash.  Neurological: Negative.   All other systems reviewed and are negative.  Physical Exam Updated Vital Signs BP (!) 186/85   Pulse 70   Resp 16   Wt 81.7 kg   SpO2 100%   BMI 29.07 kg/m   Physical Exam Vitals and nursing note reviewed.  Constitutional:      General: She is not in acute distress.    Appearance: She is well-developed. She is not ill-appearing.  HENT:     Head:  Atraumatic.     Mouth/Throat:     Comments: Clear, no mucous membranes Eyes:     Pupils: Pupils are equal, round, and reactive to light.  Cardiovascular:     Rate and Rhythm: Normal rate.     Pulses: Normal pulses.     Heart sounds: Normal heart sounds. No murmur heard. Pulmonary:     Effort: No respiratory distress.  Abdominal:     General: There is no distension.  Musculoskeletal:        General: Normal range of motion.     Cervical back: Normal range of motion.  Skin:    General: Skin is warm and dry.  Comments: Diffuse maculopapular rash to bilateral wrists, forearm, anterior chest, scant scattered across the back.  No blisters, pustules, warmth, fluctuance, induration, vesicles, bulla, desquamated skin, target lesions.  Picture in chart  Neurological:     General: No focal deficit present.     Mental Status: She is alert.  Psychiatric:        Mood and Affect: Mood normal.       ED Results / Procedures / Treatments   Labs (all labs ordered are listed, but only abnormal results are displayed) Labs Reviewed - No data to display  EKG None  Radiology No results found.  Procedures Procedures   Medications Ordered in ED Medications  diphenhydrAMINE (BENADRYL) capsule 25 mg (25 mg Oral Given 10/14/21 1259)   ED Course  I have reviewed the triage vital signs and the nursing notes.  Pertinent labs & imaging results that were available during my care of the patient were reviewed by me and considered in my medical decision making (see chart for details).  Rash consistent with scabies. Patient denies any difficulty breathing or swallowing.  Pt has a patent airway without stridor and is handling secretions without difficulty; no angioedema. No blisters, no pustules, no warmth, no draining sinus tracts, no superficial abscesses, no bullous impetigo, no vesicles, no desquamation, no target lesions with dusky purpura or a central bulla. Not tender to touch. No concern for  superimposed infection. No concern for SJS, TEN, TSS, tick borne illness, syphilis or other life-threatening condition.  DC home with ivermectin given failed permethrin, Atarax for itching.  Close follow-up with PCP, dermatology symptoms do not improve.  She is actively picking at the lesions however does not appear to have active psychosis to suggest drug-induced parasitosis as cause of her lesions.  Low suspicion for sepsis, meningitic rash.  The patient has been appropriately medically screened and/or stabilized in the ED. I have low suspicion for any other emergent medical condition which would require further screening, evaluation or treatment in the ED or require inpatient management.  Patient is hemodynamically stable and in no acute distress.  Patient able to ambulate in department prior to ED.  Evaluation does not show acute pathology that would require ongoing or additional emergent interventions while in the emergency department or further inpatient treatment.  I have discussed the diagnosis with the patient and answered all questions.  Pain is been managed while in the emergency department and patient has no further complaints prior to discharge.  Patient is comfortable with plan discussed in room and is stable for discharge at this time.  I have discussed strict return precautions for returning to the emergency department.  Patient was encouraged to follow-up with PCP/specialist refer to at discharge.    MDM Rules/Calculators/A&P                            Final Clinical Impression(s) / ED Diagnoses Final diagnoses:  Rash    Rx / DC Orders ED Discharge Orders          Ordered    ivermectin (STROMECTOL) 3 MG TABS tablet   Once        10/14/21 1435    hydrOXYzine (ATARAX) 25 MG tablet  Every 6 hours        10/14/21 1435             Aseel Truxillo A, PA-C 10/14/21 1441    Gareth Morgan, MD 10/15/21 0015

## 2021-10-30 ENCOUNTER — Ambulatory Visit (HOSPITAL_COMMUNITY)
Admission: EM | Admit: 2021-10-30 | Discharge: 2021-10-30 | Disposition: A | Discharge status: Home / Self Care | Payer: BC Managed Care – PPO | Attending: Physician Assistant | Admitting: Physician Assistant

## 2021-10-30 ENCOUNTER — Encounter (HOSPITAL_COMMUNITY): Payer: Self-pay | Admitting: Emergency Medicine

## 2021-10-30 ENCOUNTER — Other Ambulatory Visit: Payer: Self-pay

## 2021-10-30 DIAGNOSIS — B86 Scabies: Secondary | ICD-10-CM

## 2021-10-30 DIAGNOSIS — L282 Other prurigo: Secondary | ICD-10-CM

## 2021-10-30 DIAGNOSIS — R030 Elevated blood-pressure reading, without diagnosis of hypertension: Secondary | ICD-10-CM

## 2021-10-30 MED ORDER — PERMETHRIN 5 % EX CREA: TOPICAL_CREAM | CUTANEOUS | 1 refills | Status: DC

## 2021-10-30 MED ORDER — IVERMECTIN 3 MG PO TABS: 150.0000 ug/kg | ORAL_TABLET | Freq: Once | ORAL | 1 refills | Status: AC

## 2021-10-30 MED ORDER — AMLODIPINE BESYLATE 10 MG PO TABS: 10.0000 mg | ORAL_TABLET | Freq: Every day | ORAL | 0 refills | Status: DC

## 2021-10-30 NOTE — ED Triage Notes (Signed)
Pt reports rash has that been going on for 2 days. Ws treated for scabies for it earlier this month for same.

## 2021-10-30 NOTE — ED Provider Notes (Signed)
[facility]    CSN: [id number] Arrival date & time: 10/30/21  1137      History   Chief Complaint Chief Complaint  Patient presents with   Rash    HPI [name] is a 55 y.o. female.   Patient presents today with recurrent pruritic rash.  She was seen by an urgent care and prescribed permethrin after developing rash several weeks ago.  Symptoms persisted so she went to the emergency room on 10/14/2021 at which point she was prescribed ivermectin.  Reports that when she initially took dose symptoms significantly improved only to recur a few days later.  She does report that she cleaned her house.  She denies any specific exposure but had gone to a stranger's house to pick up a rescue dog and wonders if she could have been exposed at that time.  Denies history of dermatological condition.  She denies any changes to personal hygiene products including soaps or detergents.  Denies any oral lesions.  Denies any fever, nausea, vomiting.  Reports she is having difficulty sleeping as result of symptoms.  Patient's blood pressure is very elevated and has been elevated for several recent visits.  She is not currently taking antihypertensive medications but is open to starting these.  She denies any chest pain, shortness of breath, headache, dizziness, leg swelling, vision changes.   Past Medical History:  Diagnosis Date   Asthma    Blood transfusion    Depression    GERD (gastroesophageal reflux disease)    Hepatitis A    Hypertension    Jaundice    Migraines    Ovarian cyst    UTI (urinary tract infection)     Patient Active Problem List   Diagnosis Date Noted   Asthma exacerbation 08/04/2017   Respiratory distress    ALLERGIC RHINITIS 01/29/2010   Attention deficit disorder 12/14/2009   Anxiety state 10/23/2009   Adjustment disorder with mixed anxiety and depressed mood 05/25/2009   Hypertension 05/25/2009   Asthma 05/25/2009   GERD 05/25/2009    Past  Surgical History:  Procedure Laterality Date   APPENDECTOMY  1994   OPEN REDUCTION INTERNAL FIXATION (ORIF) METACARPAL Right 08/15/2016   Procedure: OPEN REDUCTION INTERNAL FIXATION (ORIF) right ring finger middle phalanx fracture;  Surgeon: Betha Loa, MD;  Location: [facility];  Service: Orthopedics;  Laterality: Right;   OVARIAN CYST REMOVAL     TONSILLECTOMY AND ADENOIDECTOMY      OB History   No obstetric history on file.      Home Medications    Prior to Admission medications   Medication Sig Start Date End Date Taking? Authorizing Provider  amLODipine (NORVASC) 10 MG tablet Take 1 tablet (10 mg total) by mouth daily. 10/30/21  Yes Arlyss Weathersby K, PA-C  ivermectin (STROMECTOL) 3 MG TABS tablet Take 4 tablets (12,000 mcg total) by mouth once for 1 dose. 10/30/21 10/30/21 Yes Zhyon Antenucci, Noberto Retort, PA-C  permethrin (ELIMITE) 5 % cream Apply cream to body.  Shower 8 to 12 hours after application.  Can repeat in 1 week. 10/30/21  Yes Andy Moye K, PA-C  albuterol (PROVENTIL HFA;VENTOLIN HFA) 108 (90 Base) MCG/ACT inhaler Inhale 2 puffs into the lungs every 6 (six) hours as needed for wheezing or shortness of breath. 01/14/19   Philip Aspen, Limmie Patricia, MD  albuterol (PROVENTIL) (2.5 MG/3ML) 0.083% nebulizer solution Take 3 mLs (2.5 mg total) by nebulization every 6 (six) hours as needed for wheezing or shortness  of breath. 01/14/19   Isaac Bliss, Rayford Halsted, MD  cetirizine (ZYRTEC) 10 MG tablet Take 1 tablet (10 mg total) by mouth daily. 01/18/21   Volney American, PA-C  FLUoxetine (PROZAC) 40 MG capsule Take 40 mg by mouth daily. 10/05/18   [provider]  fluticasone (FLONASE) 50 MCG/ACT nasal spray Place 1 spray into both nostrils in the morning and at bedtime. 01/18/21   Volney American, PA-C  Fluticasone-Salmeterol (ADVAIR DISKUS) 250-50 MCG/DOSE AEPB Inhale 1 puff into the lungs 2 (two) times daily. 01/18/21   Volney American, PA-C   fluticasone-salmeterol (ADVAIR) 100-50 MCG/ACT AEPB Inhale 1 puff into the lungs 2 (two) times daily.    [provider]  hydrOXYzine (ATARAX) 25 MG tablet Take 1 tablet (25 mg total) by mouth every 6 (six) hours. 10/14/21   Henderly, Britni A, PA-C  naproxen sodium (ALEVE) 220 MG tablet Take 440 mg by mouth as needed (pain).    [provider]  predniSONE (STERAPRED UNI-PAK 21 TAB) 10 MG (21) TBPK tablet Take by mouth daily. Take 6 tabs by mouth daily  for 2 days, then 5 tabs for 2 days, then 4 tabs for 2 days, then 3 tabs for 2 days, 2 tabs for 2 days, then 1 tab by mouth daily for 2 days 03/11/21   Hazel Sams, PA-C  QVAR REDIHALER 80 MCG/ACT inhaler INHALE 1 PUFF INTO THE LUNGS TWICE DAILY 01/06/20   Burchette, Alinda Sierras, MD  esomeprazole (NEXIUM) 40 MG capsule Take 40 mg by mouth daily at 12 noon. Patient not taking: No sig reported  03/11/21  [provider]  losartan-hydrochlorothiazide (HYZAAR) 100-12.5 MG tablet TAKE 1 TABLET BY MOUTH ONCE DAILY Patient not taking: Reported on 01/18/2021 02/26/19 03/11/21  [name], MD    Family History Family History  Problem Relation Age of Onset   Asthma Son    Hypertension Other    Crohn's disease Father    Crohn's disease Maternal Aunt    Esophageal cancer Maternal Uncle    Breast cancer Maternal Grandmother     Social History Social History   Tobacco Use   Smoking status: Never   Smokeless tobacco: Never  Vaping Use   Vaping Use: Never used  Substance Use Topics   Alcohol use: Yes    Comment: several times monthly   Drug use: No     Allergies   Aspirin, Codeine sulfate, and Hydrocodone-acetaminophen   Review of Systems Review of Systems  Constitutional:  Positive for activity change. Negative for appetite change, fatigue and fever.  Eyes:  Negative for visual disturbance.  Respiratory:  Negative for cough and shortness of breath.   Cardiovascular:  Negative for chest pain.  Gastrointestinal:   Negative for abdominal pain, diarrhea, nausea and vomiting.  Skin:  Positive for rash.  Neurological:  Negative for dizziness, light-headedness and headaches.    Physical Exam Triage Vital Signs ED Triage Vitals  Enc Vitals Group     BP 10/30/21 1246 (!) 194/106     Pulse Rate 10/30/21 1246 90     Resp 10/30/21 1246 19     Temp 10/30/21 1246 98.2 F (36.8 C)     Temp Source 10/30/21 1246 Oral     SpO2 10/30/21 1246 97 %     Weight --      Height --      Head Circumference --      Peak Flow --      Pain Score  10/30/21 1245 0     Pain Loc --      Pain Edu? --      Excl. in [place]? --    No data found.  Updated Vital Signs BP (!) 194/106 (BP Location: Right Arm)    Pulse 90    Temp 98.2 F (36.8 C) (Oral)    Resp 19    SpO2 97%   Visual Acuity Right Eye Distance:   Left Eye Distance:   Bilateral Distance:    Right Eye Near:   Left Eye Near:    Bilateral Near:     Physical Exam Vitals reviewed.  Constitutional:      General: She is awake. She is not in acute distress.    Appearance: Normal appearance. She is well-developed. She is not ill-appearing.     Comments: Very pleasant female appears stated age no acute distress sitting in exam room in obvious discomfort  HENT:     Head: Normocephalic and atraumatic.  Cardiovascular:     Rate and Rhythm: Normal rate and regular rhythm.     Heart sounds: Normal heart sounds, S1 normal and S2 normal. No murmur heard. Pulmonary:     Effort: Pulmonary effort is normal.     Breath sounds: Normal breath sounds. No wheezing, rhonchi or rales.     Comments: Clear to auscultation bilaterally Skin:    Findings: Rash present. Rash is crusting, macular and papular.     Comments: Widespread maculopapular rash with crusting noted on extremities and anterior neck with excoriation.  Psychiatric:        Behavior: Behavior is cooperative.        UC Treatments / Results  Labs (all labs ordered are listed, but only abnormal results  are displayed) Labs Reviewed - No data to display  EKG   Radiology No results found.  Procedures Procedures (including critical care time)  Medications Ordered in UC Medications - No data to display  Initial Impression / Assessment and Plan / UC Course  I have reviewed the triage vital signs and the nursing notes.  Pertinent labs & imaging results that were available during my care of the patient were reviewed by me and considered in my medical decision making (see chart for details).     Rash consistent with recurrent scabies.  Patient was treated with combination permethrin and ivermectin with instruction to repeat dose of both medications in 1 week.  Discussed the importance of cleaning everything in her home including washing all linens on hot water to prevent reinfection.  Discussed alarm symptoms that warrant emergent evaluation.  If symptoms or not improving or recur she may need to consider dermatologist referral and was given contact information for local provider.  Strict return precautions given to which she expressed understanding.  Blood pressure is very elevated today but patient denies any signs/symptoms of endorgan damage.  Given elevation will start amlodipine 10 mg daily.  Recommended she avoid decongestants, NSAIDs, caffeine, sodium.  She is to monitor her blood pressure at home and keep log for evaluation of follow-up appointment.  Recommend she follow-up with either our clinic or primary care within 1 to 2 weeks for blood pressure recheck.  Discussed that if she develops any chest pain, shortness of breath, leg swelling, headache, vision changes, dizziness in the setting of high blood pressure she needs to go to the emergency room to which she expressed understanding.  Final Clinical Impressions(s) / UC Diagnoses   Final diagnoses:  Scabies  Pruritic  rash  Elevated blood pressure reading     Discharge Instructions      Use permethrin cream at the same time  you take ivermectin.  You can repeat dose of each medication in 1 week.  Make sure that you clean everything to prevent reinfection.  If symptoms are not improving please follow-up with dermatology as we discussed.  Your blood pressure is very elevated.  Please start amlodipine.  Monitor this at home.  Avoid NSAIDs including aspirin, ibuprofen/Advil, naproxen/Aleve as well as caffeine/sodium/decongestants.  If you develop any chest pain, shortness of breath, headache, vision changes in the setting of high blood pressure you need to go to the emergency room.  Follow-up with our clinic or PCP within 1 to 2 weeks for blood pressure recheck.     ED Prescriptions     Medication Sig Dispense Auth. Provider   permethrin (ELIMITE) 5 % cream Apply cream to body.  Shower 8 to 12 hours after application.  Can repeat in 1 week. 60 g Donetta Isaza K, PA-C   ivermectin (STROMECTOL) 3 MG TABS tablet Take 4 tablets (12,000 mcg total) by mouth once for 1 dose. 4 tablet Harrell Niehoff K, PA-C   amLODipine (NORVASC) 10 MG tablet Take 1 tablet (10 mg total) by mouth daily. 30 tablet Phillipa Morden, Derry Skill, PA-C      PDMP not reviewed this encounter.   Terrilee Croak, PA-C 10/30/21 1318

## 2021-10-30 NOTE — Discharge Instructions (Signed)
Use permethrin cream at the same time you take ivermectin.  You can repeat dose of each medication in 1 week.  Make sure that you clean everything to prevent reinfection.  If symptoms are not improving please follow-up with dermatology as we discussed.  Your blood pressure is very elevated.  Please start amlodipine.  Monitor this at home.  Avoid NSAIDs including aspirin, ibuprofen/Advil, naproxen/Aleve as well as caffeine/sodium/decongestants.  If you develop any chest pain, shortness of breath, headache, vision changes in the setting of high blood pressure you need to go to the emergency room.  Follow-up with our clinic or PCP within 1 to 2 weeks for blood pressure recheck.

## 2021-11-05 ENCOUNTER — Observation Stay (HOSPITAL_COMMUNITY)
Admission: EM | Admit: 2021-11-05 | Discharge: 2021-11-06 | Disposition: A | Discharge status: Home / Self Care | Payer: Self-pay | Attending: Emergency Medicine | Admitting: Emergency Medicine

## 2021-11-05 ENCOUNTER — Other Ambulatory Visit: Payer: Self-pay

## 2021-11-05 ENCOUNTER — Encounter (HOSPITAL_COMMUNITY): Payer: Self-pay | Admitting: Emergency Medicine

## 2021-11-05 DIAGNOSIS — I10 Essential (primary) hypertension: Secondary | ICD-10-CM | POA: Insufficient documentation

## 2021-11-05 DIAGNOSIS — Z79899 Other long term (current) drug therapy: Secondary | ICD-10-CM | POA: Insufficient documentation

## 2021-11-05 DIAGNOSIS — D649 Anemia, unspecified: Principal | ICD-10-CM | POA: Diagnosis present

## 2021-11-05 DIAGNOSIS — R21 Rash and other nonspecific skin eruption: Secondary | ICD-10-CM

## 2021-11-05 DIAGNOSIS — B86 Scabies: Secondary | ICD-10-CM | POA: Insufficient documentation

## 2021-11-05 DIAGNOSIS — J45909 Unspecified asthma, uncomplicated: Secondary | ICD-10-CM | POA: Diagnosis present

## 2021-11-05 DIAGNOSIS — F988 Other specified behavioral and emotional disorders with onset usually occurring in childhood and adolescence: Secondary | ICD-10-CM | POA: Diagnosis present

## 2021-11-05 DIAGNOSIS — F411 Generalized anxiety disorder: Secondary | ICD-10-CM | POA: Diagnosis present

## 2021-11-05 DIAGNOSIS — J4540 Moderate persistent asthma, uncomplicated: Secondary | ICD-10-CM

## 2021-11-05 DIAGNOSIS — Z7951 Long term (current) use of inhaled steroids: Secondary | ICD-10-CM | POA: Insufficient documentation

## 2021-11-05 LAB — CBC WITH DIFFERENTIAL/PLATELET
Abs Immature Granulocytes: 0 10*3/uL (ref 0.00–0.07)
Abs Immature Granulocytes: 0.04 10*3/uL (ref 0.00–0.07)
Basophils Absolute: 0 10*3/uL (ref 0.0–0.1)
Basophils Absolute: 0.1 10*3/uL (ref 0.0–0.1)
Basophils Relative: 0 %
Basophils Relative: 1 %
Eosinophils Absolute: 0.7 10*3/uL — ABNORMAL HIGH (ref 0.0–0.5)
Eosinophils Absolute: 0.9 10*3/uL — ABNORMAL HIGH (ref 0.0–0.5)
Eosinophils Relative: 7 %
Eosinophils Relative: 8 %
HCT: 25 % — ABNORMAL LOW (ref 36.0–46.0)
HCT: 23.5 % — ABNORMAL LOW (ref 36.0–46.0)
Hemoglobin: 6.2 g/dL — CL (ref 12.0–15.0)
Hemoglobin: 6.2 g/dL — CL (ref 12.0–15.0)
Immature Granulocytes: 0 %
Lymphocytes Relative: 32 %
Lymphocytes Relative: 29 %
Lymphs Abs: 3.1 10*3/uL (ref 0.7–4.0)
Lymphs Abs: 3.3 10*3/uL (ref 0.7–4.0)
MCH: 15.9 pg — ABNORMAL LOW (ref 26.0–34.0)
MCH: 15.9 pg — ABNORMAL LOW (ref 26.0–34.0)
MCHC: 25.6 g/dL — ABNORMAL LOW (ref 30.0–36.0)
MCHC: 26.4 g/dL — ABNORMAL LOW (ref 30.0–36.0)
MCV: 62 fL — ABNORMAL LOW (ref 80.0–100.0)
MCV: 60.4 fL — ABNORMAL LOW (ref 80.0–100.0)
Monocytes Absolute: 0.2 10*3/uL (ref 0.1–1.0)
Monocytes Absolute: 0.8 10*3/uL (ref 0.1–1.0)
Monocytes Relative: 2 %
Monocytes Relative: 7 %
Neutro Abs: 5.7 10*3/uL (ref 1.7–7.7)
Neutro Abs: 6.5 10*3/uL (ref 1.7–7.7)
Neutrophils Relative %: 59 %
Neutrophils Relative %: 55 %
Platelets: 482 10*3/uL — ABNORMAL HIGH (ref 150–400)
Platelets: 498 10*3/uL — ABNORMAL HIGH (ref 150–400)
RBC: 4.03 MIL/uL (ref 3.87–5.11)
RBC: 3.89 MIL/uL (ref 3.87–5.11)
RDW: 22.6 % — ABNORMAL HIGH (ref 11.5–15.5)
RDW: 22.3 % — ABNORMAL HIGH (ref 11.5–15.5)
WBC: 9.6 10*3/uL (ref 4.0–10.5)
WBC: 11.6 10*3/uL — ABNORMAL HIGH (ref 4.0–10.5)
nRBC: 0 % (ref 0.0–0.2)
nRBC: 0 /100 WBC
nRBC: 0 % (ref 0.0–0.2)

## 2021-11-05 LAB — BASIC METABOLIC PANEL
Anion gap: 10 (ref 5–15)
BUN: 19 mg/dL (ref 6–20)
CO2: 20 mmol/L — ABNORMAL LOW (ref 22–32)
Calcium: 8.6 mg/dL — ABNORMAL LOW (ref 8.9–10.3)
Chloride: 108 mmol/L (ref 98–111)
Creatinine, Ser: 0.61 mg/dL (ref 0.44–1.00)
GFR, Estimated: >60 mL/min (ref >60)
Glucose, Bld: 90 mg/dL (ref 70–99)
Potassium: 3.7 mmol/L (ref 3.5–5.1)
Sodium: 138 mmol/L (ref 135–145)

## 2021-11-05 LAB — PREPARE RBC (CROSSMATCH)

## 2021-11-05 LAB — HEPATIC FUNCTION PANEL
ALT: 28 U/L (ref 0–44)
AST: 22 U/L (ref 15–41)
Albumin: 3.4 g/dL — ABNORMAL LOW (ref 3.5–5.0)
Alkaline Phosphatase: 100 U/L (ref 38–126)
Bilirubin, Direct: <0.1 mg/dL (ref 0.0–0.2)
Total Bilirubin: 0.5 mg/dL (ref 0.3–1.2)
Total Protein: 7.3 g/dL (ref 6.5–8.1)

## 2021-11-05 LAB — ABO/RH: ABO/RH(D): A POS

## 2021-11-05 LAB — POC OCCULT BLOOD, ED: Fecal Occult Bld: NEGATIVE

## 2021-11-05 MED ORDER — DIPHENHYDRAMINE HCL 50 MG/ML IJ SOLN
12.5000 mg | Freq: Once | INTRAMUSCULAR | Status: AC
Start: 2021-11-05 — End: 2021-11-05
  Administered 2021-11-05: 12.5 mg via INTRAVENOUS
  Filled 2021-11-05: qty 1

## 2021-11-05 MED ORDER — SODIUM CHLORIDE 0.9% IV SOLUTION: Freq: Once | INTRAVENOUS | Status: AC

## 2021-11-05 NOTE — ED Triage Notes (Signed)
Patient here for rash, treated for same last month. VSS.

## 2021-11-05 NOTE — ED Provider Notes (Signed)
Emergency Medicine Provider Triage Evaluation Note  [name] , a 56 y.o. female  was evaluated in triage.  Pt complains of diffuse rash.  Was previously treated with scabies.  Just finished a course of ivermectin and initially noted improvement but upon finishing it the rash returned.  It is extremely pruritic.  She reports associated pagophagia.  No fever or chills.  Review of Systems  Positive:  Negative: See above   Physical Exam  BP (!) 180/117    Pulse 88    Temp 99.3 F (37.4 C) (Oral)    Resp 18    SpO2 100%  Gen:   Awake, no distress   Resp:  Normal effort  MSK:   Moves extremities without difficulty  Other:    Medical Decision Making  Medically screening exam initiated at 4:41 PM.  Appropriate orders placed.  [name] was informed that the remainder of the evaluation will be completed by another provider, this initial triage assessment does not replace that evaluation, and the importance of remaining in the ED until their evaluation is complete.  Wishes to get blood work.    Teressa Lower, PA-C 11/05/21 1642    Sloan Leiter, DO 11/06/21 0011

## 2021-11-05 NOTE — ED Provider Notes (Signed)
[facility] EMERGENCY DEPARTMENT Provider Note   CSN: [id number] Arrival date & time: 11/05/21  1614     History  Chief Complaint  Patient presents with   Rash   Allergic Reaction    [name] is a 56 y.o. female.  Pt reports she has scabies.  Pt has been treated with ivermectin and permetherin.  Pt reports symptoms began after taking in a rescue dog. Pt reports she has had some increased weakness  The history is provided by the patient. No language interpreter was used.  Rash Location:  Full body Quality: itchiness and redness   Severity:  Moderate Onset quality:  Gradual Timing:  Constant Progression:  Worsening Chronicity:  New Context: not sick contacts   Relieved by:  Nothing Worsened by:  Nothing Allergic Reaction Presenting symptoms: rash   Context: insect bite/sting   Relieved by:  Nothing Worsened by:  Nothing     Home Medications Prior to Admission medications   Medication Sig Start Date End Date Taking? Authorizing Provider  albuterol (PROVENTIL HFA;VENTOLIN HFA) 108 (90 Base) MCG/ACT inhaler Inhale 2 puffs into the lungs every 6 (six) hours as needed for wheezing or shortness of breath. 01/14/19  Yes Isaac Bliss, Rayford Halsted, MD  ALPRAZolam Duanne Moron) 0.25 MG tablet Take 0.25 mg by mouth daily as needed for anxiety. 10/24/21  Yes [provider]  amLODipine (NORVASC) 10 MG tablet Take 1 tablet (10 mg total) by mouth daily. 10/30/21  Yes Raspet, Erin K, PA-C  amphetamine-dextroamphetamine (ADDERALL XR) 25 MG 24 hr capsule Take 25 mg by mouth 2 (two) times daily. 10/30/21  Yes [provider]  diphenhydrAMINE (BENADRYL) 25 MG tablet Take 50 mg by mouth every 6 (six) hours as needed for itching.   Yes [provider]  FLUoxetine (PROZAC) 20 MG tablet Take 20 mg by mouth every morning. 10/20/21  Yes [provider]  hydrOXYzine (ATARAX) 25 MG tablet Take 1 tablet (25 mg total) by mouth every 6 (six)  hours. Patient taking differently: Take 25 mg by mouth every 8 (eight) hours as needed for itching or anxiety. 10/14/21  Yes Henderly, Britni A, PA-C  albuterol (PROVENTIL) (2.5 MG/3ML) 0.083% nebulizer solution Take 3 mLs (2.5 mg total) by nebulization every 6 (six) hours as needed for wheezing or shortness of breath. Patient not taking: Reported on 11/05/2021 01/14/19   Isaac Bliss, Rayford Halsted, MD  cetirizine (ZYRTEC) 10 MG tablet Take 1 tablet (10 mg total) by mouth daily. Patient not taking: Reported on 11/05/2021 01/18/21   Volney American, PA-C  fluticasone [facility]) 50 MCG/ACT nasal spray Place 1 spray into both nostrils in the morning and at bedtime. Patient not taking: Reported on 11/05/2021 01/18/21   Volney American, PA-C  Fluticasone-Salmeterol (ADVAIR DISKUS) 250-50 MCG/DOSE AEPB Inhale 1 puff into the lungs 2 (two) times daily. Patient not taking: Reported on 11/05/2021 01/18/21   Volney American, PA-C  permethrin (ELIMITE) 5 % cream Apply cream to body.  Shower 8 to 12 hours after application.  Can repeat in 1 week. Patient not taking: Reported on 11/05/2021 10/30/21   Raspet, Derry Skill, PA-C  predniSONE (STERAPRED UNI-PAK 21 TAB) 10 MG (21) TBPK tablet Take by mouth daily. Take 6 tabs by mouth daily  for 2 days, then 5 tabs for 2 days, then 4 tabs for 2 days, then 3 tabs for 2 days, 2 tabs for 2 days, then 1 tab by mouth daily for 2 days Patient not taking:  Reported on 11/05/2021 03/11/21   Hazel Sams, PA-C  QVAR REDIHALER 80 MCG/ACT inhaler INHALE 1 PUFF INTO THE LUNGS TWICE DAILY Patient not taking: Reported on 11/05/2021 01/06/20   [name], MD  esomeprazole (NEXIUM) 40 MG capsule Take 40 mg by mouth daily at 12 noon. Patient not taking: No sig reported  03/11/21  [provider]  losartan-hydrochlorothiazide (HYZAAR) 100-12.5 MG tablet TAKE 1 TABLET BY MOUTH ONCE DAILY Patient not taking: Reported on 01/18/2021 02/26/19 03/11/21  [name], MD       Allergies    Aspirin, Codeine sulfate, and Hydrocodone-acetaminophen    Review of Systems   Review of Systems  Skin:  Positive for rash.  All other systems reviewed and are negative.  Physical Exam Updated Vital Signs BP (!) 179/75    Pulse 99    Temp 99.3 F (37.4 C) (Oral)    Resp 19    SpO2 99%  Physical Exam Vitals and nursing note reviewed.  Constitutional:      Appearance: She is well-developed.  HENT:     Head: Normocephalic.  Eyes:     Pupils: Pupils are equal, round, and reactive to light.  Cardiovascular:     Rate and Rhythm: Normal rate.  Pulmonary:     Effort: Pulmonary effort is normal.  Abdominal:     General: Abdomen is flat. There is no distension.  Musculoskeletal:        General: Normal range of motion.     Cervical back: Normal range of motion.  Skin:    Findings: Rash present.     Comments: Red raised rash, burrows ? Multiple scratch marks.   Neurological:     General: No focal deficit present.     Mental Status: She is alert and oriented to person, place, and time.  Psychiatric:        Mood and Affect: Mood normal.    ED Results / Procedures / Treatments   Labs (all labs ordered are listed, but only abnormal results are displayed) Labs Reviewed  CBC WITH DIFFERENTIAL/PLATELET - Abnormal; Notable for the following components:      Result Value   Hemoglobin 6.2 (*)    HCT 25.0 (*)    MCV 62.0 (*)    MCH 15.9 (*)    MCHC 25.6 (*)    RDW 22.6 (*)    Platelets 482 (*)    Eosinophils Absolute 0.7 (*)    All other components within normal limits  BASIC METABOLIC PANEL - Abnormal; Notable for the following components:   CO2 20 (*)    Calcium 8.6 (*)    All other components within normal limits  CBC WITH DIFFERENTIAL/PLATELET  HEPATIC FUNCTION PANEL  POC OCCULT BLOOD, ED  ABO/RH  TYPE AND SCREEN    EKG None  Radiology No results found.  Procedures .Critical Care E&M Performed by: Fransico Meadow, PA-C  Critical care provider  statement:    Critical care time (minutes):  30   Critical care start time:  11/05/2021 10:00 AM   Critical care end time:  11/05/2021 11:52 PM   Critical care was necessary to treat or prevent imminent or life-threatening deterioration of the following conditions:  Circulatory failure   Critical care was time spent personally by me on the following activities:  Interpretation of cardiac output measurements, evaluation of patient's response to treatment, re-evaluation of patient's condition, pulse oximetry, ordering and review of laboratory studies and ordering and performing treatments and interventions  I assumed direction of critical care for this patient from another provider in my specialty: no     Care discussed with: admitting provider   After initial E/M assessment, critical care services were subsequently performed that were exclusive of separately billable procedures or treatment.      Medications Ordered in ED Medications  diphenhydrAMINE (BENADRYL) injection 12.5 mg (12.5 mg Intravenous Given 11/05/21 2255)    ED Course/ Medical Decision Making/ A&P                           Medical Decision Making Amount and/or Complexity of Data Reviewed External Data Reviewed: labs.    Details: last hemoglobin 3 years ago Labs: ordered. Decision-making details documented in ED Course.    Details: Pt's hemoglobin is 5.2 Discussion of management or test interpretation with external provider(s): Hospitalitis consulted  Dr. Myna Hidalgo will admit   Risk Decision regarding hospitalization.  Critical Care Total time providing critical care: < 30 minutes    {Document critical care time when appropriate:      Final Clinical Impression(s) / ED Diagnoses Final diagnoses:  Rash  Anemia, unspecified type    Rx / DC Orders ED Discharge Orders     None         Sidney Ace 11/05/21 2353    Deno Etienne, DO 11/06/21 1500

## 2021-11-06 ENCOUNTER — Encounter (HOSPITAL_COMMUNITY): Payer: Self-pay | Admitting: Family Medicine

## 2021-11-06 ENCOUNTER — Other Ambulatory Visit: Payer: Self-pay | Admitting: Physician Assistant

## 2021-11-06 DIAGNOSIS — D508 Other iron deficiency anemias: Secondary | ICD-10-CM

## 2021-11-06 DIAGNOSIS — D649 Anemia, unspecified: Secondary | ICD-10-CM | POA: Diagnosis present

## 2021-11-06 DIAGNOSIS — D509 Iron deficiency anemia, unspecified: Secondary | ICD-10-CM

## 2021-11-06 DIAGNOSIS — K219 Gastro-esophageal reflux disease without esophagitis: Secondary | ICD-10-CM

## 2021-11-06 LAB — HIV ANTIBODY (ROUTINE TESTING W REFLEX): HIV Screen 4th Generation wRfx: NONREACTIVE

## 2021-11-06 LAB — BASIC METABOLIC PANEL
Anion gap: 10 (ref 5–15)
BUN: 17 mg/dL (ref 6–20)
CO2: 22 mmol/L (ref 22–32)
Calcium: 8.4 mg/dL — ABNORMAL LOW (ref 8.9–10.3)
Chloride: 104 mmol/L (ref 98–111)
Creatinine, Ser: 0.68 mg/dL (ref 0.44–1.00)
GFR, Estimated: >60 mL/min (ref >60)
Glucose, Bld: 118 mg/dL — ABNORMAL HIGH (ref 70–99)
Potassium: 2.9 mmol/L — ABNORMAL LOW (ref 3.5–5.1)
Sodium: 136 mmol/L (ref 135–145)

## 2021-11-06 LAB — FOLATE: Folate: 19.7 ng/mL (ref >5.9)

## 2021-11-06 LAB — IRON AND TIBC
Iron: 11 ug/dL — ABNORMAL LOW (ref 28–170)
Saturation Ratios: 2 % — ABNORMAL LOW (ref 10.4–31.8)
TIBC: 500 ug/dL — ABNORMAL HIGH (ref 250–450)
UIBC: 489 ug/dL

## 2021-11-06 LAB — CBC
HCT: 30.7 % — ABNORMAL LOW (ref 36.0–46.0)
Hemoglobin: 9.1 g/dL — ABNORMAL LOW (ref 12.0–15.0)
MCH: 19.4 pg — ABNORMAL LOW (ref 26.0–34.0)
MCHC: 29.6 g/dL — ABNORMAL LOW (ref 30.0–36.0)
MCV: 65.6 fL — ABNORMAL LOW (ref 80.0–100.0)
Platelets: 453 10*3/uL — ABNORMAL HIGH (ref 150–400)
RBC: 4.68 MIL/uL (ref 3.87–5.11)
RDW: 26.2 % — ABNORMAL HIGH (ref 11.5–15.5)
WBC: 11.5 10*3/uL — ABNORMAL HIGH (ref 4.0–10.5)
nRBC: 0 % (ref 0.0–0.2)

## 2021-11-06 LAB — TSH: TSH: 1.776 u[IU]/mL (ref 0.350–4.500)

## 2021-11-06 LAB — FERRITIN: Ferritin: 4 ng/mL — ABNORMAL LOW (ref 11–307)

## 2021-11-06 LAB — RETICULOCYTES
Immature Retic Fract: 29.5 % — ABNORMAL HIGH (ref 2.3–15.9)
RBC.: 3.95 MIL/uL (ref 3.87–5.11)
Retic Count, Absolute: 65.6 10*3/uL (ref 19.0–186.0)
Retic Ct Pct: 1.7 % (ref 0.4–3.1)

## 2021-11-06 LAB — VITAMIN B12: Vitamin B-12: 289 pg/mL (ref 180–914)

## 2021-11-06 MED ORDER — SODIUM CHLORIDE 0.9% IV SOLUTION: Freq: Once | INTRAVENOUS | Status: DC

## 2021-11-06 MED ORDER — ALBUTEROL SULFATE (2.5 MG/3ML) 0.083% IN NEBU: 2.5000 mg | INHALATION_SOLUTION | Freq: Four times a day (QID) | RESPIRATORY_TRACT | Status: DC | PRN

## 2021-11-06 MED ORDER — TRIAMCINOLONE 0.1 % CREAM:EUCERIN CREAM 1:1: 1.0000 "application " | TOPICAL_CREAM | Freq: Three times a day (TID) | CUTANEOUS | 1 refills | Status: DC | PRN

## 2021-11-06 MED ORDER — FLUOXETINE HCL 20 MG PO CAPS
20.0000 mg | ORAL_CAPSULE | Freq: Every morning | ORAL | Status: DC
  Administered 2021-11-06: 20 mg via ORAL
  Filled 2021-11-06: qty 1

## 2021-11-06 MED ORDER — HYDROXYZINE HCL 25 MG PO TABS: 25.0000 mg | ORAL_TABLET | ORAL | 0 refills | Status: DC | PRN

## 2021-11-06 MED ORDER — FERROUS SULFATE 325 (65 FE) MG PO TABS
325.0000 mg | ORAL_TABLET | Freq: Every day | ORAL | Status: DC
  Filled 2021-11-06: qty 1

## 2021-11-06 MED ORDER — MOMETASONE FURO-FORMOTEROL FUM 200-5 MCG/ACT IN AERO
2.0000 | INHALATION_SPRAY | Freq: Two times a day (BID) | RESPIRATORY_TRACT | Status: DC
  Administered 2021-11-06: 2 via RESPIRATORY_TRACT
  Filled 2021-11-06: qty 8.8

## 2021-11-06 MED ORDER — ACETAMINOPHEN 325 MG PO TABS
650.0000 mg | ORAL_TABLET | Freq: Four times a day (QID) | ORAL | Status: DC | PRN
  Administered 2021-11-06: 650 mg via ORAL
  Filled 2021-11-06: qty 2

## 2021-11-06 MED ORDER — SENNOSIDES-DOCUSATE SODIUM 8.6-50 MG PO TABS: 1.0000 | ORAL_TABLET | Freq: Every evening | ORAL | Status: DC | PRN

## 2021-11-06 MED ORDER — POTASSIUM CHLORIDE CRYS ER 20 MEQ PO TBCR
40.0000 meq | EXTENDED_RELEASE_TABLET | ORAL | Status: AC
  Administered 2021-11-06 (×2): 40 meq via ORAL
  Filled 2021-11-06: qty 2

## 2021-11-06 MED ORDER — AMLODIPINE BESYLATE 5 MG PO TABS
10.0000 mg | ORAL_TABLET | Freq: Every day | ORAL | Status: DC
  Administered 2021-11-06: 10 mg via ORAL
  Filled 2021-11-06 (×2): qty 2

## 2021-11-06 MED ORDER — ALBUTEROL SULFATE (2.5 MG/3ML) 0.083% IN NEBU: 3.0000 mL | INHALATION_SOLUTION | RESPIRATORY_TRACT | Status: DC | PRN

## 2021-11-06 MED ORDER — ACETAMINOPHEN 650 MG RE SUPP: 650.0000 mg | Freq: Four times a day (QID) | RECTAL | Status: DC | PRN

## 2021-11-06 MED ORDER — POTASSIUM CHLORIDE CRYS ER 20 MEQ PO TBCR: 40.0000 meq | EXTENDED_RELEASE_TABLET | Freq: Every day | ORAL | 0 refills | Status: DC

## 2021-11-06 MED ORDER — SODIUM CHLORIDE 0.9 % IV SOLN
510.0000 mg | Freq: Once | INTRAVENOUS | Status: AC
  Administered 2021-11-06: 510 mg via INTRAVENOUS
  Filled 2021-11-06: qty 17

## 2021-11-06 MED ORDER — FERROUS SULFATE 325 (65 FE) MG PO TABS: 325.0000 mg | ORAL_TABLET | Freq: Two times a day (BID) | ORAL | 3 refills | Status: AC

## 2021-11-06 MED ORDER — HYDROXYZINE HCL 25 MG PO TABS
25.0000 mg | ORAL_TABLET | ORAL | Status: DC | PRN
  Administered 2021-11-06 (×2): 25 mg via ORAL
  Filled 2021-11-06 (×2): qty 1

## 2021-11-06 MED ORDER — LABETALOL HCL 5 MG/ML IV SOLN
10.0000 mg | INTRAVENOUS | Status: DC | PRN
  Administered 2021-11-06: 10 mg via INTRAVENOUS
  Filled 2021-11-06: qty 4

## 2021-11-06 MED ORDER — SODIUM CHLORIDE 0.9 % IV SOLN
250.0000 mg | Freq: Every day | INTRAVENOUS | Status: DC
  Filled 2021-11-06: qty 20

## 2021-11-06 MED ORDER — ALPRAZOLAM 0.25 MG PO TABS
0.2500 mg | ORAL_TABLET | Freq: Every day | ORAL | Status: DC | PRN
  Administered 2021-11-06 (×2): 0.25 mg via ORAL
  Filled 2021-11-06 (×3): qty 1

## 2021-11-06 MED ORDER — TRIAMCINOLONE 0.1 % CREAM:EUCERIN CREAM 1:1
TOPICAL_CREAM | Freq: Three times a day (TID) | CUTANEOUS | Status: DC
  Administered 2021-11-06: 1 via TOPICAL
  Filled 2021-11-06: qty 1

## 2021-11-06 NOTE — Consult Note (Addendum)
[facility] Consult: 8:41 AM 11/06/2021  LOS: 0 days    Referring Provider: Dr. Tawanna Solo Primary Care Physician:  [name], MD Primary Gastroenterologist:  Dr Ardis Hughs, [place] for GERD in 2014.      Reason for Consultation:  IDA anemia, FOBT negative   HPI: [name] is a 56 y.o. female.  PMH asthma.  Migraines.  Hepatitis A, jaundice.  Moderate sized HH, circumferential wall thickening and surrounding inflammation at distal duodenum compatible with infectious or inflammatory enteritis per 10/2018 CT. surgeries include ovarian cystectomy, tonsil/adenoidectomy, appendectomy, repair finger fracture.  No previous colonoscopy or EGD  Has been caring for rescue dog.  Pruritic rash, dx w scabies in recent weeks.  Initially treated with permethrin, then ivermectin after 12/11 ED visit..  Rash improved but recurred.  Returned to ED 12/27 with recurrent pruritic rash.  At these visits hypertension noted (194/106).  Prescribed permethrin and ivermectin.  Started amlodipine. Returned to ED 11/05/21.  Rash had improved but returned after finishing treatment.    Hgb 6.2, was 14.4 3 weeks ago with ranges anywhere from 11.5-13 previously.  MCV 60. Iron 11, ferritin 4.  B12, folate normal. FOBT negative.  Patient takes Nexium daily which controls GERD though has rare breakthrough reflux symptoms.  No dysphagia.  No anorexia.  Weight stable.  No altered bowel habits or change in color of stool.  No blood per rectum.  No previous issues with anemia or need for iron/B12/folate supplements.  No excessive bleeding or bruising.  No thyroid issues.  Menopausal for > 3 years.  Family history of Crohn's disease in father, maternal aunt.  Iron deficiency in her mother and sister.  Esophageal cancer maternal uncle.  Breast  cancer maternal grandmother.  No family history of peptic ulcer disease, GI bleeding, colorectal/digestive disease cancers.  Patient does not consume alcoholic beverages or use tobacco products.  No use of illicit drugs including marijuana     Past Medical History:  Diagnosis Date   Asthma    Blood transfusion    Depression    GERD (gastroesophageal reflux disease)    Hepatitis A    Hypertension    Jaundice    Migraines    Ovarian cyst    UTI (urinary tract infection)     Past Surgical History:  Procedure Laterality Date   APPENDECTOMY  1994   OPEN REDUCTION INTERNAL FIXATION (ORIF) METACARPAL Right 08/15/2016   Procedure: OPEN REDUCTION INTERNAL FIXATION (ORIF) right ring finger middle phalanx fracture;  Surgeon: Leanora Cover, MD;  Location: [facility];  Service: Orthopedics;  Laterality: Right;   OVARIAN CYST REMOVAL     TONSILLECTOMY AND ADENOIDECTOMY      Prior to Admission medications   Medication Sig Start Date End Date Taking? Authorizing Provider  albuterol (PROVENTIL HFA;VENTOLIN HFA) 108 (90 Base) MCG/ACT inhaler Inhale 2 puffs into the lungs every 6 (six) hours as needed for wheezing or shortness of breath. 01/14/19  Yes Isaac Bliss, Rayford Halsted, MD  ALPRAZolam Duanne Moron) 0.25 MG tablet Take 0.25 mg by mouth daily  as needed for anxiety. 10/24/21  Yes [provider]  amLODipine (NORVASC) 10 MG tablet Take 1 tablet (10 mg total) by mouth daily. 10/30/21  Yes Raspet, Erin K, PA-C  amphetamine-dextroamphetamine (ADDERALL XR) 25 MG 24 hr capsule Take 25 mg by mouth 2 (two) times daily. 10/30/21  Yes [provider]  diphenhydrAMINE (BENADRYL) 25 MG tablet Take 50 mg by mouth every 6 (six) hours as needed for itching.   Yes [provider]  FLUoxetine (PROZAC) 20 MG tablet Take 20 mg by mouth every morning. 10/20/21  Yes [provider]  hydrOXYzine (ATARAX) 25 MG tablet Take 1 tablet (25 mg total) by mouth every 6 (six)  hours. Patient taking differently: Take 25 mg by mouth every 8 (eight) hours as needed for itching or anxiety. 10/14/21  Yes Henderly, Britni A, PA-C  albuterol (PROVENTIL) (2.5 MG/3ML) 0.083% nebulizer solution Take 3 mLs (2.5 mg total) by nebulization every 6 (six) hours as needed for wheezing or shortness of breath. Patient not taking: Reported on 11/05/2021 01/14/19   Isaac Bliss, Rayford Halsted, MD  cetirizine (ZYRTEC) 10 MG tablet Take 1 tablet (10 mg total) by mouth daily. Patient not taking: Reported on 11/05/2021 01/18/21   Volney American, PA-C  fluticasone [facility]) 50 MCG/ACT nasal spray Place 1 spray into both nostrils in the morning and at bedtime. Patient not taking: Reported on 11/05/2021 01/18/21   Volney American, PA-C  Fluticasone-Salmeterol (ADVAIR DISKUS) 250-50 MCG/DOSE AEPB Inhale 1 puff into the lungs 2 (two) times daily. Patient not taking: Reported on 11/05/2021 01/18/21   Volney American, PA-C  permethrin (ELIMITE) 5 % cream Apply cream to body.  Shower 8 to 12 hours after application.  Can repeat in 1 week. Patient not taking: Reported on 11/05/2021 10/30/21   Raspet, Derry Skill, PA-C  predniSONE (STERAPRED UNI-PAK 21 TAB) 10 MG (21) TBPK tablet Take by mouth daily. Take 6 tabs by mouth daily  for 2 days, then 5 tabs for 2 days, then 4 tabs for 2 days, then 3 tabs for 2 days, 2 tabs for 2 days, then 1 tab by mouth daily for 2 days Patient not taking: Reported on 11/05/2021 03/11/21   Hazel Sams, PA-C  QVAR REDIHALER 80 MCG/ACT inhaler INHALE 1 PUFF INTO THE LUNGS TWICE DAILY Patient not taking: Reported on 11/05/2021 01/06/20   [name], MD  esomeprazole (NEXIUM) 40 MG capsule Take 40 mg by mouth daily at 12 noon. Patient not taking: No sig reported  03/11/21  [provider]  losartan-hydrochlorothiazide (HYZAAR) 100-12.5 MG tablet TAKE 1 TABLET BY MOUTH ONCE DAILY Patient not taking: Reported on 01/18/2021 02/26/19 03/11/21  [name], MD     Scheduled Meds:  sodium chloride   Intravenous Once   sodium chloride   Intravenous Once   amLODipine  10 mg Oral Daily   [START ON 11/07/2021] ferrous sulfate  325 mg Oral Q breakfast   FLUoxetine  20 mg Oral q morning   mometasone-formoterol  2 puff Inhalation BID   triamcinolone 0.1 % cream : eucerin   Topical TID   Infusions:  ferumoxytol     PRN Meds: acetaminophen **OR** acetaminophen, albuterol, ALPRAZolam, hydrOXYzine, labetalol, senna-docusate   Allergies as of 11/05/2021 - Review Complete 11/05/2021  Allergen Reaction Noted   Aspirin Shortness Of Breath 05/25/2009   Codeine sulfate Hives and Nausea And Vomiting 05/25/2009   Hydrocodone-acetaminophen Itching and Rash 05/25/2009    Family History  Problem Relation Age  of Onset   Asthma Son    Hypertension Other    Crohn's disease Father    Crohn's disease Maternal Aunt    Esophageal cancer Maternal Uncle    Breast cancer Maternal Grandmother     Social History   Socioeconomic History   Marital status: Married    Spouse name: Not on file   Number of children: 2   Years of education: Not on file   Highest education level: Not on file  Occupational History   Occupation: Production assistant, 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    Employer: [employer]  Tobacco Use   Smoking status: Never   Smokeless tobacco: Never  Vaping Use   Vaping Use: Never used  Substance and Sexual Activity   Alcohol use: Yes    Comment: several times monthly   Drug use: No   Sexual activity: Yes    Birth control/protection: None    Comment: husband has had a vasectomy  Other Topics Concern   Not on file  Social History Narrative   Not on file   Social Determinants of Health   Financial Resource Strain: Not on file  Food Insecurity: Not on file  Transportation Needs: Not on file  Physical Activity: Not on file  Stress: Not on file  Social Connections: Not on file  Intimate Partner Violence: Not on file    REVIEW OF  SYSTEMS: Constitutional: No shortness of breath, no fatigue.  No weakness ENT:  No nose bleeds Pulm: No shortness of breath or cough. CV:  No palpitations, no LE edema.  No angina GU:  No hematuria, no frequency GI: See HPI. Heme: See HPI. Transfusions: See HPI. Neuro:  No headaches, no peripheral tingling or numbness.   Derm:  No itching, no rash or sores.  Endocrine:  No sweats or chills.  No polyuria or dysuria Immunization: Reviewed. Travel:  None beyond local counties in last few months.    PHYSICAL EXAM: Vital signs in last 24 hours: Vitals:   11/06/21 0639 11/06/21 0832  BP: (!) 186/82 (!) 185/78  Pulse: 79 75  Resp:  16  Temp: 98.7 F (37.1 C)   SpO2: 100% 100%   Wt Readings from Last 3 Encounters:  11/06/21 72.6 kg  10/14/21 81.7 kg  01/14/19 80.6 kg    General: Patient looks well other than obvious, widespread rash on her trunk and arms.  Alert, comfortable. Head: No facial asymmetry or swelling.  No signs of head trauma. Eyes: Conjunctiva pink.  No scleral icterus.  EOMI. Ears: Not hard of hearing Nose: No congestion or discharge Mouth: Mucosa is moist, pink, clear.  Tongue midline.  Dentition fair.   Neck: No thyromegaly, no masses, no JVD Lungs: Clear bilaterally without labored breathing or cough Heart: RRR.  No MRG.  S1, S2 present. Abdomen: Soft, nonobese.  Active bowel sounds.  No HSM, masses, bruits, hernias.   Rectal: Deferred.  Stool submitted to lab in last 24 hours was FOBT negative. Musc/Skeltl: No joint redness, swelling or gross deformity. Extremities: Feet are warm.  No CCE. Neurologic: Oriented x3.  Moves all 4 limbs.  No tremor, no limb weakness. Skin: Rash present on arms, upper back, chest, neck. Nodes: No cervical adenopathy Psych: Slightly anxious but pleasant, cooperative.  Intake/Output from previous day: 01/02 0701 - 01/03 0700 In: 630 [Blood:630] Out: -  Intake/Output this shift: No intake/output data recorded.  LAB  RESULTS: Recent Labs    11/05/21 1645 11/05/21 2258  WBC 9.6 11.6*  HGB 6.2* 6.2*  HCT 25.0* 23.5*  PLT 482* 498*   BMET Lab Results  Component Value Date   NA 138 11/05/2021   NA 138 10/14/2018   NA 139 08/04/2017   K 3.7 11/05/2021   K 3.9 10/14/2018   K 3.5 08/04/2017   CL 108 11/05/2021   CL 104 10/14/2018   CL 105 08/04/2017   CO2 20 (L) 11/05/2021   CO2 24 10/14/2018   CO2 21 (L) 08/04/2017   GLUCOSE 90 11/05/2021   GLUCOSE 113 (H) 10/14/2018   GLUCOSE 107 (H) 08/04/2017   BUN 19 11/05/2021   BUN 24 (H) 10/14/2018   BUN 12 08/04/2017   CREATININE 0.61 11/05/2021   CREATININE 0.88 10/14/2018   CREATININE 0.94 08/04/2017   CALCIUM 8.6 (L) 11/05/2021   CALCIUM 8.9 10/14/2018   CALCIUM 9.3 08/04/2017   LFT Recent Labs    11/05/21 2258  PROT 7.3  ALBUMIN 3.4*  AST 22  ALT 28  ALKPHOS 100  BILITOT 0.5  BILIDIR <0.1  IBILI NOT CALCULATED   PT/INR No results found for: INR, PROTIME Hepatitis Panel No results for input(s): HEPBSAG, HCVAB, HEPAIGM, HEPBIGM in the last 72 hours. C-Diff No components found for: CDIFF Lipase     Component Value Date/Time   LIPASE 31 10/14/2018 0507    Drugs of Abuse     Component Value Date/Time   LABOPIA NONE DETECTED 09/13/2014 0316   COCAINSCRNUR NONE DETECTED 09/13/2014 0316   LABBENZ NONE DETECTED 09/13/2014 0316   AMPHETMU NONE DETECTED 09/13/2014 0316   THCU NONE DETECTED 09/13/2014 0316   LABBARB NONE DETECTED 09/13/2014 0316     RADIOLOGY STUDIES: No results found.    IMPRESSION:      Iron deficiency anemia, acute.  FOBT negative.  GI review of systems unremarkable.  Chronic Nexium which controls GERD.  Duodenitis and hiatal hernia on CT in 2019.  Received 2 PRBCs thus far  Persistent scabies following multiple courses of ivermectin, permethrin topical    PLAN:     Discussed plans for EGD and colonoscopy with the patient.  She states she cannot stay in hospital to pursue these and would  prefer to undergo outpatient procedures.  I do not think this is unreasonable especially given that she is FOBT negative.  However need to discuss with Dr. Scarlette Shorts.  Addendum.  Case discussed with Dr. Henrene Pastor.  Plan is to continue Nexium, begin oral iron supplement (may want to consider parenteral iron Ferrlecit before she goes home) Fe SO4 325 mg daily.  She has fup w NP Chester Holstein on Jan 18  at 3 PM, get CBC then.     Azucena Freed  11/06/2021, 8:41 AM Phone [phone]  GI ATTENDING  History, laboratories, x-rays reviewed.  Patient seen and examined.  Agree with comprehensive consultation note as outlined above.  The patient presents for treatment of persistent/recurrent scabies.  Noted to have progressive anemia (iron deficiency) without evidence for GI bleeding by history.  Stool Hemoccults were negative.  She is on chronic PPI.  She will need GI work-up for her iron deficiency anemia (at least colonoscopy/upper endoscopy possible capsule endoscopy).  She is clinically stable and okay for outpatient work-up, as is her preference.  She is now on iron replacement therapy.  GI follow-up has been arranged in 2 weeks.  Endoscopic procedures with Dr. Ardis Hughs can be set up thereafter  Docia Chuck. Geri Seminole., M.D. [facility] Division of Gastroenterology

## 2021-11-06 NOTE — Discharge Summary (Signed)
Physician Discharge Summary  [name] ZOX:[id number] DOB: 23-Mar-1966 DOA: 11/05/2021  PCP: Kristian CoveyBurchette, Bruce W, MD  Admit date: 11/05/2021 Discharge date: 11/06/2021  Admitted From: Home Disposition:  Home  Discharge Condition:Stable CODE STATUS:FULL Diet recommendation: Heart Healthy    Brief/Interim Summary: Patient is a 56 year old female with history of anxiety, ADHD, asthma who presented to the emergency room with complaints of itching, exertional dyspnea, fatigue.  She was recently diagnosed with scabies and was treated with ivermectin but has severe pruritus.  She noted worsening exertional dyspnea and fatigue over weeks.  No history of abdominal pain, nausea or vomiting or melena/hematochezia.  No history of heavy menstrual bleeding .  On presentation she was afebrile, tachycardic, hypertensive.  CBC showed microcytic anemia with hemoglobin of 6.2, thrombocytosis.  2 units of PRBC were ordered. Hospital course remarkable for persistent hypertension.  GI consulted to rule out GI bleed, she does not want to stay in the hospital and wants to follow-up as an outpatient.  GI appointment scheduled on 1/18.  Following problems were addressed during her hospitalization:   Symptomatic microcytic anemia: No history of hematochezia, melena.  FOBT negative.  Presented with hemoglobin in the range of 6, exertional dyspnea, fatigue.  Status post 2 units of PRBC transfusion, hemoglobin in the range of 9 today. no history of vaginal bleed, she is postmenopausal.  Iron studies showed low iron.  Given IV iron.  She needs to continue oral iron supplementation on discharge. Normal vitamin B12, folic acid Gastroenterology consulted, she wants outpatient work-up.  Appointment scheduled on 1/18  Severe hypertension: Takes amlodipine 10 mg daily.  She is to monitor her blood pressure at home.  Needs to titrate her medication if needed, we recommend to follow-up with PCP in a week   recent history of  scabies with itching: Treated with ivermectin on 12/11 and 12/27, now with pruritus.  Continue antihistamines, topical steroids.   Anxiety/ADHD: On as needed Xanax,on adderral at home  GI continue supplementation  Discharge Diagnoses:  Principal Problem:   Symptomatic anemia Active Problems:   Anxiety state   Attention deficit disorder   Asthma    Discharge Instructions  Discharge Instructions     Diet - low sodium heart healthy   Complete by: As directed    Discharge instructions   Complete by: As directed    1)Please follow up with gastroenterology as an outpatient.  You have an appointment with gastroenterology on 1/18 at 3 PM 2)Follow up with your PCP in 1 to 2 weeks 3) monitor your blood pressure at home   Increase activity slowly   Complete by: As directed       Allergies as of 11/06/2021       Reactions   Aspirin Shortness Of Breath   REACTION: wheezing problems   Codeine Sulfate Hives, Nausea And Vomiting   REACTION: rash, itiching   Hydrocodone-acetaminophen Itching, Rash        Medication List     STOP taking these medications    cetirizine 10 MG tablet Commonly known as: ZYRTEC   fluticasone 50 MCG/ACT nasal spray Commonly known as: FLONASE   Fluticasone-Salmeterol 250-50 MCG/DOSE Aepb Commonly known as: Advair Diskus   predniSONE 10 MG (21) Tbpk tablet Commonly known as: STERAPRED UNI-PAK 21 TAB       TAKE these medications    albuterol 108 (90 Base) MCG/ACT inhaler Commonly known as: VENTOLIN HFA Inhale 2 puffs into the lungs every 6 (six) hours as needed for wheezing or  shortness of breath. What changed: Another medication with the same name was removed. Continue taking this medication, and follow the directions you see here.   ALPRAZolam 0.25 MG tablet Commonly known as: XANAX Take 0.25 mg by mouth daily as needed for anxiety.   amLODipine 10 MG tablet Commonly known as: NORVASC Take 1 tablet (10 mg total) by mouth daily.    amphetamine-dextroamphetamine 25 MG 24 hr capsule Commonly known as: ADDERALL XR Take 25 mg by mouth 2 (two) times daily.   diphenhydrAMINE 25 MG tablet Commonly known as: BENADRYL Take 50 mg by mouth every 6 (six) hours as needed for itching.   ferrous sulfate 325 (65 FE) MG tablet Take 1 tablet (325 mg total) by mouth 2 (two) times daily with a meal.   FLUoxetine 20 MG tablet Commonly known as: PROZAC Take 20 mg by mouth every morning.   hydrOXYzine 25 MG tablet Commonly known as: ATARAX Take 1 tablet (25 mg total) by mouth every 4 (four) hours as needed for itching. What changed:  when to take this reasons to take this   permethrin 5 % cream Commonly known as: ELIMITE Apply cream to body.  Shower 8 to 12 hours after application.  Can repeat in 1 week.   potassium chloride SA 20 MEQ tablet Commonly known as: KLOR-CON M Take 2 tablets (40 mEq total) by mouth daily for 7 days. Start taking on: November 07, 2021   Qvar RediHaler 80 MCG/ACT inhaler Generic drug: beclomethasone INHALE 1 PUFF INTO THE LUNGS TWICE DAILY   triamcinolone 0.1 % cream : eucerin Crea Apply 1 application topically 3 (three) times daily as needed for itching, rash or irritation.        Follow-up Information     Meredith Pel, NP Follow up on 11/21/2021.   Specialty: Gastroenterology Why: 3 PM follow up w Laurette Schimke.  go to lab in basement of [facility] building on  the 16th or 17th or AM of the 18th for rechck of blood counts. Contact information: [address] [place] [address] [phone]         Kristian Covey, MD. Schedule an appointment as soon as possible for a visit in 1 week(s).   Specialty: Family Medicine Contact information: [address] [place] [address] [phone]                Allergies  Allergen Reactions   Aspirin Shortness Of Breath    REACTION: wheezing problems   Codeine Sulfate Hives and Nausea And Vomiting    REACTION: rash,  itiching   Hydrocodone-Acetaminophen Itching and Rash    Consultations: Gastroenterology   Procedures/Studies: No results found.    Subjective: Patient seen and examined at the bedside this morning.  Hemodynamically stable for discharge.  Discharge Exam: Vitals:   11/06/21 0832 11/06/21 1009  BP: (!) 185/78 (!) 161/71  Pulse: 75 72  Resp: 16 15  Temp:    SpO2: 100% 100%   Vitals:   11/06/21 0405 11/06/21 0639 11/06/21 0832 11/06/21 1009  BP: (!) 175/80 (!) 186/82 (!) 185/78 (!) 161/71  Pulse: 86 79 75 72  Resp:   16 15  Temp: 98.6 F (37 C) 98.7 F (37.1 C)    TempSrc: Oral Oral    SpO2:  100% 100% 100%  Weight:      Height:        General: Pt is alert, awake, not in acute distress Cardiovascular: RRR, S1/S2 +, no rubs, no gallops  Respiratory: CTA bilaterally, no wheezing, no rhonchi Abdominal: Soft, NT, ND, bowel sounds + Extremities: no edema, no cyanosis, scattered abrasion rash with bleeding due to severe itching    The results of significant diagnostics from this hospitalization (including imaging, microbiology, ancillary and laboratory) are listed below for reference.     Microbiology: No results found for this or any previous visit (from the past 240 hour(s)).   Labs: BNP (last 3 results) No results for input(s): BNP in the last 8760 hours. Basic Metabolic Panel: Recent Labs  Lab 11/05/21 1645 11/06/21 0906  NA 138 136  K 3.7 2.9*  CL 108 104  CO2 20* 22  GLUCOSE 90 118*  BUN 19 17  CREATININE 0.61 0.68  CALCIUM 8.6* 8.4*   Liver Function Tests: Recent Labs  Lab 11/05/21 2258  AST 22  ALT 28  ALKPHOS 100  BILITOT 0.5  PROT 7.3  ALBUMIN 3.4*   No results for input(s): LIPASE, AMYLASE in the last 168 hours. No results for input(s): AMMONIA in the last 168 hours. CBC: Recent Labs  Lab 11/05/21 1645 11/05/21 2258 11/06/21 0906  WBC 9.6 11.6* 11.5*  NEUTROABS 5.7 6.5  --   HGB 6.2* 6.2* 9.1*  HCT 25.0* 23.5* 30.7*  MCV  62.0* 60.4* 65.6*  PLT 482* 498* 453*   Cardiac Enzymes: No results for input(s): CKTOTAL, CKMB, CKMBINDEX, TROPONINI in the last 168 hours. BNP: Invalid input(s): POCBNP CBG: No results for input(s): GLUCAP in the last 168 hours. D-Dimer No results for input(s): DDIMER in the last 72 hours. Hgb A1c No results for input(s): HGBA1C in the last 72 hours. Lipid Profile No results for input(s): CHOL, HDL, LDLCALC, TRIG, CHOLHDL, LDLDIRECT in the last 72 hours. Thyroid function studies No results for input(s): TSH, T4TOTAL, T3FREE, THYROIDAB in the last 72 hours.  Invalid input(s): FREET3 Anemia work up Recent Labs    11/05/21 2346  VITAMINB12 289  FOLATE 19.7  FERRITIN 4*  TIBC 500*  IRON 11*  RETICCTPCT 1.7   Urinalysis    Component Value Date/Time   COLORURINE YELLOW 10/14/2018 0505   APPEARANCEUR CLOUDY (A) 10/14/2018 0505   LABSPEC 1.030 10/14/2018 0505   PHURINE 5.0 10/14/2018 0505   GLUCOSEU NEGATIVE 10/14/2018 0505   HGBUR NEGATIVE 10/14/2018 0505   BILIRUBINUR NEGATIVE 10/14/2018 0505   KETONESUR NEGATIVE 10/14/2018 0505   PROTEINUR NEGATIVE 10/14/2018 0505   UROBILINOGEN 0.2 11/12/2014 1308   NITRITE NEGATIVE 10/14/2018 0505   LEUKOCYTESUR NEGATIVE 10/14/2018 0505   Sepsis Labs Invalid input(s): PROCALCITONIN,  WBC,  LACTICIDVEN Microbiology No results found for this or any previous visit (from the past 240 hour(s)).  Please note: You were cared for by a hospitalist during your hospital stay. Once you are discharged, your primary care physician will handle any further medical issues. Please note that NO REFILLS for any discharge medications will be authorized once you are discharged, as it is imperative that you return to your primary care physician (or establish a relationship with a primary care physician if you do not have one) for your post hospital discharge needs so that they can reassess your need for medications and monitor your lab values.    Time  coordinating discharge: 40 minutes  SIGNED:   Burnadette Pop, MD  [facility] 11/06/2021, 11:05 AM Pager [phone]  If 7PM-7AM, please contact night-coverage [email or web address] Password [email or web address]

## 2021-11-06 NOTE — ED Notes (Signed)
Pt removing monitoring equipment, stating the blood pressure cuff hurts and I cant take it. Pt compliant when updating v/s and needing values for blood administration. Pt comfortable at this time

## 2021-11-06 NOTE — H&P (Signed)
History and Physical    [name] [id number] DOB: 06-25-1966 DOA: 11/05/2021  PCP: [name], MD   Patient coming from: home   Chief Complaint: Itching, DOE, fatigue   HPI: [name] is a pleasant 56 y.o. female with medical history significant for anxiety, ADHD, and asthma, now presenting to the emergency department for evaluation of itching, exertional dyspnea, and fatigue.  Patient was diagnosed with scabies recently and treated with ivermectin on 10/14/2021 and again on 10/30/2021, had good improvement initially but now has severe pruritus.  She also notes insidiously worsening exertional dyspnea and fatigue over weeks.  She denies abdominal pain, nausea, vomiting, melena, or hematochezia.  She denies vaginal bleeding  ED Course: Upon arrival to the ED, patient is found to be afebrile, saturating well on room air, slightly tachycardic, and hypertensive.  CBC notable for microcytic anemia with hemoglobin 6.2, as well as a thrombocytosis.  2 units of RBC were ordered for transfusion and patient was also given IV Benadryl.  Review of Systems:  All other systems reviewed and apart from HPI, are negative.  Past Medical History:  Diagnosis Date   Asthma    Blood transfusion    Depression    GERD (gastroesophageal reflux disease)    Hepatitis A    Hypertension    Jaundice    Migraines    Ovarian cyst    UTI (urinary tract infection)     Past Surgical History:  Procedure Laterality Date   APPENDECTOMY  1994   OPEN REDUCTION INTERNAL FIXATION (ORIF) METACARPAL Right 08/15/2016   Procedure: OPEN REDUCTION INTERNAL FIXATION (ORIF) right ring finger middle phalanx fracture;  Surgeon: Leanora Cover, MD;  Location: [facility];  Service: Orthopedics;  Laterality: Right;   OVARIAN CYST REMOVAL     TONSILLECTOMY AND ADENOIDECTOMY      Social History:   reports that she has never smoked. She has never used smokeless tobacco. She reports current  alcohol use. She reports that she does not use drugs.  Allergies  Allergen Reactions   Aspirin Shortness Of Breath    REACTION: wheezing problems   Codeine Sulfate Hives and Nausea And Vomiting    REACTION: rash, itiching   Hydrocodone-Acetaminophen Itching and Rash    Family History  Problem Relation Age of Onset   Asthma Son    Hypertension Other    Crohn's disease Father    Crohn's disease Maternal Aunt    Esophageal cancer Maternal Uncle    Breast cancer Maternal Grandmother      Prior to Admission medications   Medication Sig Start Date End Date Taking? Authorizing Provider  albuterol (PROVENTIL HFA;VENTOLIN HFA) 108 (90 Base) MCG/ACT inhaler Inhale 2 puffs into the lungs every 6 (six) hours as needed for wheezing or shortness of breath. 01/14/19  Yes Isaac Bliss, Rayford Halsted, MD  ALPRAZolam Duanne Moron) 0.25 MG tablet Take 0.25 mg by mouth daily as needed for anxiety. 10/24/21  Yes [provider]  amLODipine (NORVASC) 10 MG tablet Take 1 tablet (10 mg total) by mouth daily. 10/30/21  Yes Raspet, Erin K, PA-C  amphetamine-dextroamphetamine (ADDERALL XR) 25 MG 24 hr capsule Take 25 mg by mouth 2 (two) times daily. 10/30/21  Yes [provider]  diphenhydrAMINE (BENADRYL) 25 MG tablet Take 50 mg by mouth every 6 (six) hours as needed for itching.   Yes [provider]  FLUoxetine (PROZAC) 20 MG tablet Take 20 mg by mouth every morning. 10/20/21  Yes [provider]  hydrOXYzine (ATARAX) 25 MG tablet Take 1 tablet (25 mg total) by mouth every 6 (six) hours. Patient taking differently: Take 25 mg by mouth every 8 (eight) hours as needed for itching or anxiety. 10/14/21  Yes Henderly, Britni A, PA-C  albuterol (PROVENTIL) (2.5 MG/3ML) 0.083% nebulizer solution Take 3 mLs (2.5 mg total) by nebulization every 6 (six) hours as needed for wheezing or shortness of breath. Patient not taking: Reported on 11/05/2021 01/14/19   Isaac Bliss, Rayford Halsted, MD   cetirizine (ZYRTEC) 10 MG tablet Take 1 tablet (10 mg total) by mouth daily. Patient not taking: Reported on 11/05/2021 01/18/21   Volney American, PA-C  fluticasone [facility]) 50 MCG/ACT nasal spray Place 1 spray into both nostrils in the morning and at bedtime. Patient not taking: Reported on 11/05/2021 01/18/21   Volney American, PA-C  Fluticasone-Salmeterol (ADVAIR DISKUS) 250-50 MCG/DOSE AEPB Inhale 1 puff into the lungs 2 (two) times daily. Patient not taking: Reported on 11/05/2021 01/18/21   Volney American, PA-C  permethrin (ELIMITE) 5 % cream Apply cream to body.  Shower 8 to 12 hours after application.  Can repeat in 1 week. Patient not taking: Reported on 11/05/2021 10/30/21   Raspet, Derry Skill, PA-C  predniSONE (STERAPRED UNI-PAK 21 TAB) 10 MG (21) TBPK tablet Take by mouth daily. Take 6 tabs by mouth daily  for 2 days, then 5 tabs for 2 days, then 4 tabs for 2 days, then 3 tabs for 2 days, 2 tabs for 2 days, then 1 tab by mouth daily for 2 days Patient not taking: Reported on 11/05/2021 03/11/21   Hazel Sams, PA-C  QVAR REDIHALER 80 MCG/ACT inhaler INHALE 1 PUFF INTO THE LUNGS TWICE DAILY Patient not taking: Reported on 11/05/2021 01/06/20   [name], MD  esomeprazole (NEXIUM) 40 MG capsule Take 40 mg by mouth daily at 12 noon. Patient not taking: No sig reported  03/11/21  [provider]  losartan-hydrochlorothiazide (HYZAAR) 100-12.5 MG tablet TAKE 1 TABLET BY MOUTH ONCE DAILY Patient not taking: Reported on 01/18/2021 02/26/19 03/11/21  [name], MD    Physical Exam: Vitals:   11/05/21 1624 11/05/21 2125 11/05/21 2130  BP: (!) 180/117 (!) 186/79 (!) 179/75  Pulse: 88 (!) 103 99  Resp: 18 (!) 21 19  Temp: 99.3 F (37.4 C)    TempSrc: Oral    SpO2: 100% 100% 99%    Constitutional: NAD, anxious   Eyes: PERTLA, lids and conjunctivae normal ENMT: Mucous membranes are moist. Posterior pharynx clear of any exudate or lesions.   Neck: supple, no  masses  Respiratory: no wheezing, no crackles. No accessory muscle use.  Cardiovascular: S1 & S2 heard, regular rate and rhythm. No extremity edema.   Abdomen: No distension, no tenderness, soft. Bowel sounds active.  Musculoskeletal: no clubbing / cyanosis. No joint deformity upper and lower extremities.   Skin: Excoriations and erythematous papules with lichenification involving b/l arms and chest. Warm, dry, well-perfused. Neurologic: CN 2-12 grossly intact. Moving all extremities. Alert and oriented.  Psychiatric: Anxious. Cooperative.    Labs and Imaging on Admission: I have personally reviewed following labs and imaging studies  CBC: Recent Labs  Lab 11/05/21 1645 11/05/21 2258  WBC 9.6 11.6*  NEUTROABS 5.7 6.5  HGB 6.2* 6.2*  HCT 25.0* 23.5*  MCV 62.0* 60.4*  PLT 482* [id number]*   Basic Metabolic Panel: Recent Labs  Lab 11/05/21 1645  NA 138  K 3.7  CL 108  CO2 20*  GLUCOSE 90  BUN 19  CREATININE 0.61  CALCIUM 8.6*   GFR: CrCl cannot be calculated (Unknown ideal weight.). Liver Function Tests: Recent Labs  Lab 11/05/21 2258  AST 22  ALT 28  ALKPHOS 100  BILITOT 0.5  PROT 7.3  ALBUMIN 3.4*   No results for input(s): LIPASE, AMYLASE in the last 168 hours. No results for input(s): AMMONIA in the last 168 hours. Coagulation Profile: No results for input(s): INR, PROTIME in the last 168 hours. Cardiac Enzymes: No results for input(s): CKTOTAL, CKMB, CKMBINDEX, TROPONINI in the last 168 hours. BNP (last 3 results) No results for input(s): PROBNP in the last 8760 hours. HbA1C: No results for input(s): HGBA1C in the last 72 hours. CBG: No results for input(s): GLUCAP in the last 168 hours. Lipid Profile: No results for input(s): CHOL, HDL, LDLCALC, TRIG, CHOLHDL, LDLDIRECT in the last 72 hours. Thyroid Function Tests: No results for input(s): TSH, T4TOTAL, FREET4, T3FREE, THYROIDAB in the last 72 hours. Anemia Panel: Recent Labs    11/05/21 2346   RETICCTPCT 1.7   Urine analysis:    Component Value Date/Time   COLORURINE YELLOW 10/14/2018 0505   APPEARANCEUR CLOUDY (A) 10/14/2018 0505   LABSPEC 1.030 10/14/2018 0505   PHURINE 5.0 10/14/2018 0505   GLUCOSEU NEGATIVE 10/14/2018 0505   HGBUR NEGATIVE 10/14/2018 0505   BILIRUBINUR NEGATIVE 10/14/2018 0505   KETONESUR NEGATIVE 10/14/2018 0505   PROTEINUR NEGATIVE 10/14/2018 0505   UROBILINOGEN 0.2 11/12/2014 1308   NITRITE NEGATIVE 10/14/2018 0505   LEUKOCYTESUR NEGATIVE 10/14/2018 0505   Sepsis Labs: @LABRCNTIP (procalcitonin:4,lacticidven:4) )No results found for this or any previous visit (from the past 240 hour(s)).   Radiological Exams on Admission: No results found.   Assessment/Plan   1. Symptomatic anemia  - Presents with pruritis after recent scabies treatment, also notes progressive fatigue, DOE, and pica and is found to have microcytic anemia with Hgb 6.2  - She denies melena or hematochezia and FOBT is negative in ED  - 2 units RBC ordered from ED  - Check anemia panel, check post-transfusion CBC    2. Scabies  - Treated with ivermectin on 12/11 and 10/30/21, now with post-treatment pruritis  - Antihistamines, topical corticosteroids   3. Asthma  - Stable, not in exacerbation  - Continue ICS/LABA and as-needed albuterol   4. Anxiety; ADHD  - Very anxious in ED  - Continue as-needed Xanax, hold Adderrall    DVT prophylaxis: SCDs  Code Status: Full  Level of Care: Level of care: Med-Surg Family Communication: None present  Disposition Plan:  Patient is from: Home  Anticipated d/c is to: Home  Anticipated d/c date is: 1/3 or 11/07/21 Patient currently: Pending post-transfusion CBC  Consults called: none  Admission status: Observation     Vianne Bulls, MD [facility]  11/06/2021, 1:11 AM

## 2021-11-07 LAB — TYPE AND SCREEN
ABO/RH(D): A POS
Antibody Screen: NEGATIVE
Unit division: 0
Unit division: 0

## 2021-11-07 LAB — BPAM RBC
Blood Product Expiration Date: 202301202359
Blood Product Expiration Date: 202301202359
ISSUE DATE / TIME: 202301030106
ISSUE DATE / TIME: 202301030337
Unit Type and Rh: 6200
Unit Type and Rh: 6200

## 2021-11-21 ENCOUNTER — Ambulatory Visit: Payer: Self-pay | Admitting: Nurse Practitioner

## 2021-12-24 ENCOUNTER — Inpatient Hospital Stay: Payer: Self-pay | Admitting: Family Medicine

## 2021-12-28 ENCOUNTER — Encounter: Payer: Self-pay | Admitting: Family Medicine

## 2021-12-28 ENCOUNTER — Ambulatory Visit (INDEPENDENT_AMBULATORY_CARE_PROVIDER_SITE_OTHER): Payer: Self-pay | Admitting: Family Medicine

## 2021-12-28 VITALS — BP 170/96 | HR 80 | Temp 97.2°F | Ht 66.0 in | Wt 179.6 lb

## 2021-12-28 DIAGNOSIS — F419 Anxiety disorder, unspecified: Secondary | ICD-10-CM

## 2021-12-28 DIAGNOSIS — D509 Iron deficiency anemia, unspecified: Secondary | ICD-10-CM

## 2021-12-28 DIAGNOSIS — I10 Essential (primary) hypertension: Secondary | ICD-10-CM

## 2021-12-28 DIAGNOSIS — R21 Rash and other nonspecific skin eruption: Secondary | ICD-10-CM

## 2021-12-28 MED ORDER — AMLODIPINE BESYLATE 10 MG PO TABS: 10.0000 mg | ORAL_TABLET | Freq: Every day | ORAL | 11 refills | Status: DC

## 2021-12-28 MED ORDER — TRIAMCINOLONE 0.1 % CREAM:EUCERIN CREAM 1:1: 1.0000 "application " | TOPICAL_CREAM | Freq: Three times a day (TID) | CUTANEOUS | 1 refills | Status: DC | PRN

## 2021-12-28 MED ORDER — ESCITALOPRAM OXALATE 10 MG PO TABS: 10.0000 mg | ORAL_TABLET | Freq: Every day | ORAL | 5 refills | Status: DC

## 2021-12-28 NOTE — Progress Notes (Signed)
Established Patient Office Visit  Subjective:  Patient ID: [name], female    DOB: 07-24-1966  Age: 56 y.o. MRN: [id number]  CC:  Chief Complaint  Patient presents with   Hospitalization Follow-up    HPI [name] presents for follow-up regarding recent persistent skin rash and multiple other issues as below.  Longstanding history of poor compliance with primary care follow-up.  She had multiple ER and urgent care visits recently.  Her major issue has been severe pruritus.  She states that she was doing some rescue care for her dogs and she is fairly certain she picked up some type of parasite.  She was apparently treated multiple times for scabies with permethrin topically and then subsequently ivermectin.  She states that she has seen visible "worms "especially on her legs.  She states that these go through her skin before she can collect them.  She states that since treating with ivermectin her symptoms have improved slightly.  She is also used triamcinolone/Eucerin cream which is helped somewhat.  She took hydroxyzine which did not seem to help much.  In the course of one of her ER visit she complained of increased craving for salt and ice.  She had decreased appetite.  She had lab work which revealed severe anemia with hemoglobin of 6.  She received blood transfusion with hemoglobin 9.1.  She has pending follow-up in 1 week with GI.  She states she has never had colonoscopy.  Denies any bloody stools.  No melena.  She does have history of severe reflux and vomiting but no hematemesis.  Currently denies any dizziness.  No vaginal bleeding.  History of hypertension.  Had been on amlodipine in the past as well as losartan HCTZ.  Currently not taking anything.  Blood pressure is extremely high today.  She complains of severe anxiety.  She had very stressful past couple years.  Currently unemployed.  She is up selling her townhome.  She is living with her mother currently.   She underwent separation and divorce over the past year.  This has naturally been very stressful for her.  She has history of severe asthma.  Currently not using any regular inhalers.  Denies any recent flares with her asthma.  Very poor compliance with inhalers previously  Past Medical History:  Diagnosis Date   Asthma    Blood transfusion    Depression    GERD (gastroesophageal reflux disease)    Hepatitis A    Hypertension    Jaundice    Migraines    Ovarian cyst    UTI (urinary tract infection)     Past Surgical History:  Procedure Laterality Date   APPENDECTOMY  1994   OPEN REDUCTION INTERNAL FIXATION (ORIF) METACARPAL Right 08/15/2016   Procedure: OPEN REDUCTION INTERNAL FIXATION (ORIF) right ring finger middle phalanx fracture;  Surgeon: Leanora Cover, MD;  Location: [place];  Service: Orthopedics;  Laterality: Right;   OVARIAN CYST REMOVAL     TONSILLECTOMY AND ADENOIDECTOMY      Family History  Problem Relation Age of Onset   Asthma Son    Hypertension Other    Crohn's disease Father    Crohn's disease Maternal Aunt    Esophageal cancer Maternal Uncle    Breast cancer Maternal Grandmother     Social History   Socioeconomic History   Marital status: Married    Spouse name: Not on file   Number of children: 2   Years of education:  Not on file   Highest education level: Not on file  Occupational History   Occupation: Production assistant, 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    Employer: [employer]  Tobacco Use   Smoking status: Never   Smokeless tobacco: Never  Vaping Use   Vaping Use: Never used  Substance and Sexual Activity   Alcohol use: Yes    Comment: several times monthly   Drug use: No   Sexual activity: Yes    Birth control/protection: None    Comment: husband has had a vasectomy  Other Topics Concern   Not on file  Social History Narrative   Not on file   Social Determinants of Health   Financial Resource Strain: Not on file   Food Insecurity: Not on file  Transportation Needs: Not on file  Physical Activity: Not on file  Stress: Not on file  Social Connections: Not on file  Intimate Partner Violence: Not on file    Outpatient Medications Prior to Visit  Medication Sig Dispense Refill   albuterol (PROVENTIL HFA;VENTOLIN HFA) 108 (90 Base) MCG/ACT inhaler Inhale 2 puffs into the lungs every 6 (six) hours as needed for wheezing or shortness of breath. 1 Inhaler 2   ALPRAZolam (XANAX) 0.25 MG tablet Take 0.25 mg by mouth daily as needed for anxiety.     amphetamine-dextroamphetamine (ADDERALL XR) 25 MG 24 hr capsule Take 25 mg by mouth 2 (two) times daily.     diphenhydrAMINE (BENADRYL) 25 MG tablet Take 50 mg by mouth every 6 (six) hours as needed for itching.     ferrous sulfate 325 (65 FE) MG tablet Take 1 tablet (325 mg total) by mouth 2 (two) times daily with a meal. 30 tablet 3   hydrOXYzine (ATARAX) 25 MG tablet Take 1 tablet (25 mg total) by mouth every 4 (four) hours as needed for itching. 30 tablet 0   permethrin (ELIMITE) 5 % cream Apply cream to body.  Shower 8 to 12 hours after application.  Can repeat in 1 week. 60 g 1   QVAR REDIHALER 80 MCG/ACT inhaler INHALE 1 PUFF INTO THE LUNGS TWICE DAILY 10.6 g 0   amLODipine (NORVASC) 10 MG tablet Take 1 tablet (10 mg total) by mouth daily. 30 tablet 0   FLUoxetine (PROZAC) 20 MG tablet Take 20 mg by mouth every morning.     Triamcinolone Acetonide (TRIAMCINOLONE 0.1 % CREAM : EUCERIN) CREA Apply 1 application topically 3 (three) times daily as needed for itching, rash or irritation. 1 each 1   potassium chloride SA (KLOR-CON M) 20 MEQ tablet Take 2 tablets (40 mEq total) by mouth daily for 7 days. 14 tablet 0   No facility-administered medications prior to visit.    Allergies  Allergen Reactions   Aspirin Shortness Of Breath    REACTION: wheezing problems   Codeine Sulfate Hives and Nausea And Vomiting    REACTION: rash, itiching    Hydrocodone-Acetaminophen Itching and Rash    ROS Review of Systems  Constitutional:  Negative for chills, fever and unexpected weight change.  Respiratory:  Negative for cough and shortness of breath.   Cardiovascular:  Negative for chest pain.  Gastrointestinal:  Negative for abdominal pain, blood in stool, constipation, diarrhea, nausea and vomiting.  Genitourinary:  Negative for dysuria.  Skin:  Positive for rash.  Neurological:  Negative for dizziness.     Objective:    Physical Exam Vitals reviewed.  Cardiovascular:     Rate and Rhythm:  Normal rate and regular rhythm.  Pulmonary:     Effort: Pulmonary effort is normal.     Breath sounds: Normal breath sounds. No wheezing or rales.  Skin:    Findings: Rash present.     Comments: Has multiple excoriations on her forearms and lower legs bilaterally.  No signs of secondary infection.    BP (!) 170/96 (BP Location: Left Arm, Cuff Size: Normal)    Pulse 80    Temp (!) 97.2 F (36.2 C) (Oral)    Ht 5\' 6"  (1.676 m)    Wt 179 lb 9.6 oz (81.5 kg)    SpO2 97%    BMI 28.99 kg/m  Wt Readings from Last 3 Encounters:  12/28/21 179 lb 9.6 oz (81.5 kg)  11/06/21 160 lb (72.6 kg)  10/14/21 180 lb 1.6 oz (81.7 kg)     Health Maintenance Due  Topic Date Due   COVID-19 Vaccine (1) Never done   Hepatitis C Screening  Never done   TETANUS/TDAP  Never done   PAP SMEAR-Modifier  Never done   COLONOSCOPY (Pts 45-84yrs Insurance coverage will need to be confirmed)  Never done   MAMMOGRAM  Never done   Zoster Vaccines- Shingrix (1 of 2) Never done   INFLUENZA VACCINE  06/04/2021    There are no preventive care reminders to display for this patient.  Lab Results  Component Value Date   TSH 1.776 11/06/2021   Lab Results  Component Value Date   WBC 11.5 (H) 11/06/2021   HGB 9.1 (L) 11/06/2021   HCT 30.7 (L) 11/06/2021   MCV 65.6 (L) 11/06/2021   PLT 453 (H) 11/06/2021   Lab Results  Component Value Date   NA 136 11/06/2021    K 2.9 (L) 11/06/2021   CO2 22 11/06/2021   GLUCOSE 118 (H) 11/06/2021   BUN 17 11/06/2021   CREATININE 0.68 11/06/2021   BILITOT 0.5 11/05/2021   ALKPHOS 100 11/05/2021   AST 22 11/05/2021   ALT 28 11/05/2021   PROT 7.3 11/05/2021   ALBUMIN 3.4 (L) 11/05/2021   CALCIUM 8.4 (L) 11/06/2021   ANIONGAP 10 11/06/2021   No results found for: CHOL No results found for: HDL No results found for: LDLCALC No results found for: TRIG No results found for: CHOLHDL No results found for: HGBA1C    Assessment & Plan:    #1  Pruritic rash.  Patient states that she has seen "worms "crawling through her skin at times.  She has severe excoriations.  Has been tried apparently on multiple courses of permethrin and ivermectin.  No confirmed scabies. ?  Delusions of parasitosis/delusional infestation. -Avoid scratching as much as possible -Continue triamcinolone/Eucerin cream twice daily as needed -Consider trial of Lexapro 10 mg once daily -May need to consider low-dose antipsychotic medication  #2 severe hypertension.  Initial blood pressure here today 186/106.  Came down slightly with rest.  Longstanding history of hypertension.  Poor compliance of medications.  Start back amlodipine 10 mg daily and reassess in 1 month  #3 history of severe iron deficiency anemia with recent hemoglobin of 6 with ferritin of 4.  She has scheduled follow-up with GI next week.  Will need EGD and colonoscopy for further evaluation.  #4 chronic anxiety.  She had tremendous life stressors recently.  Severe excoriations on her extremities.  We recommended trial of Lexapro 10 mg once daily and follow-up in 1 month to reassess.   Meds ordered this encounter  Medications   amLODipine (  NORVASC) 10 MG tablet    Sig: Take 1 tablet (10 mg total) by mouth daily.    Dispense:  30 tablet    Refill:  11   escitalopram (LEXAPRO) 10 MG tablet    Sig: Take 1 tablet (10 mg total) by mouth daily.    Dispense:  30 tablet     Refill:  5   Triamcinolone Acetonide (TRIAMCINOLONE 0.1 % CREAM : EUCERIN) CREA    Sig: Apply 1 application topically 3 (three) times daily as needed for itching, rash or irritation.    Dispense:  1 each    Refill:  1    Follow-up: Return in about 1 month (around 01/25/2022).    Carolann Littler, MD

## 2022-01-02 ENCOUNTER — Ambulatory Visit (INDEPENDENT_AMBULATORY_CARE_PROVIDER_SITE_OTHER): Payer: Self-pay | Admitting: Nurse Practitioner

## 2022-01-02 ENCOUNTER — Telehealth: Payer: Self-pay

## 2022-01-02 ENCOUNTER — Encounter: Payer: Self-pay | Admitting: Nurse Practitioner

## 2022-01-02 VITALS — BP 142/86 | HR 71 | Ht 66.0 in | Wt 180.0 lb

## 2022-01-02 DIAGNOSIS — D509 Iron deficiency anemia, unspecified: Secondary | ICD-10-CM

## 2022-01-02 MED ORDER — NA SULFATE-K SULFATE-MG SULF 17.5-3.13-1.6 GM/177ML PO SOLN: 1.0000 | ORAL | 0 refills | Status: DC

## 2022-01-02 NOTE — Progress Notes (Signed)
I agree with the above note, plan 

## 2022-01-02 NOTE — Patient Instructions (Signed)
We have sent the following medications to your pharmacy for you to pick up at your convenience: ?Suprep  ? ?You have been scheduled for an endoscopy and colonoscopy. Please follow the written instructions given to you at your visit today. ?Please pick up your prep supplies at the pharmacy within the next 1-3 days. ?If you use inhalers (even only as needed), please bring them with you on the day of your procedure. ? ?If you are age 55 or younger, your body mass index should be between 19-25. Your Body mass index is 29.05 kg/m?Marland Kitchen If this is out of the aformentioned range listed, please consider follow up with your Primary Care Provider.  ? ?________________________________________________________ ? ?The [place] [facility] providers would like to encourage you to use [facility] to communicate with providers for non-urgent requests or questions.  Due to long hold times on the telephone, sending your provider a message by [facility] may be a faster and more efficient way to get a response.  Please allow 48 business hours for a response.  Please remember that this is for non-urgent requests.  ?_______________________________________________________ ? ?Thank you for choosing me and [facility]. ? ? ?

## 2022-01-02 NOTE — Progress Notes (Signed)
? ? ? ?ASSESSMENT AND PLAN   ? ?# 56 yo female with heme negative iron deficiency anemia.  Rule out colon neoplasm, occult GI blood loss in setting of NSAIDS ?--Schedule for EGD and colonoscopy. The risks and benefits of EGD and colonoscopy with possible biopsies were discussed with the patient who agrees to proceed.  ?--Hold oral iron 10 days prior to colonoscopy ?--Stop NSAIDS until anemia workup complete ? ?# Generalized pruritis. Recently treated multiple times ( empirically) for scabies. Patient also concerned she has worms in her skin. PCP is following, see 12/28/21 office note. Several generalized skin excoriations on exam today.  ? ? ?HISTORY OF PRESENT ILLNESS   ? ? ?Chief Complaint : hospital follow up on anemia ? ?JAHNASIA OHRT is a 56 y.o. female with a past medical history significant for asthma, migraines, anxiety , ADD , hypertension , hiatal hernia, appendectomy.  See PMH below for any additional history.  ? ?Patient known remotely to Dr. Ardis Hughs. She was hospitalized for a couple days in January 2023 with symptomatic anemia and pruritis with rash. Regarding the pruritis,this started in mid December. She had been fostering a dog who was also itching. Patient says she was not formally diagnosed but believes she had scabies and also worms in her skin. She has been treated multiple times (empirically) for scabies with topical permethrin and also Ivermectin. The dog was never formally diagnosed with anything either. The Vet thought it was just fleas causing the itching. She no longer has the dog. Patient saw her PCP on 2/24. Looks like Lexapro was started and consideration given to starting an antipsychotic medication for possible delusions of parasitosis/delusional infestation ? ?Regarding anemia, Jilliana presented to the hospital early January with iron deficiency anemia . Hemoglobin was 6.2, it was 14 in 2019 .  Ferritin was 4.  She was transfused 2 units of blood and given IV iron with  improvement in hemoglobin to 9.1 . We saw her in consultation 11/06/2021.  She did not want to remain in the hospital, preferred outpatient GI work-up. She hasn't had any change in bowel habits. No blood in stool. Stool dark since starting oral iron. She had been taking a lot of NSAIDs for skin discomfort related to itching. No [place] of colon cancer.  ? ?Ziomara has a history of GERD. Asymptomatic on Omeprzaole unless she eats late in the evening. No other GI complaints.  ? ? ?Data Reviewed: ? ?CBC Latest Ref Rng & Units 11/06/2021 11/05/2021 11/05/2021  ?WBC 4.0 - 10.5 K/uL 11.5(H) 11.6(H) 9.6  ?Hemoglobin 12.0 - 15.0 g/dL 9.1(L) 6.2(LL) 6.2(LL)  ?Hematocrit 36.0 - 46.0 % 30.7(L) 23.5(L) 25.0(L)  ?Platelets 150 - 400 K/uL 453(H) 498(H) 482(H)  ? ? ? ?CMP Latest Ref Rng & Units 11/06/2021 11/05/2021 10/14/2018  ?Glucose 70 - 99 mg/dL 118(H) 90 113(H)  ?BUN 6 - 20 mg/dL 17 19 24(H)  ?Creatinine 0.44 - 1.00 mg/dL 0.68 0.61 0.88  ?Sodium 135 - 145 mmol/L 136 138 138  ?Potassium 3.5 - 5.1 mmol/L 2.9(L) 3.7 3.9  ?Chloride 98 - 111 mmol/L 104 108 104  ?CO2 22 - 32 mmol/L 22 20(L) 24  ?Calcium 8.9 - 10.3 mg/dL 8.4(L) 8.6(L) 8.9  ?Total Protein 6.5 - 8.1 g/dL - 7.3 7.1  ?Total Bilirubin 0.3 - 1.2 mg/dL - 0.5 0.6  ?Alkaline Phos 38 - 126 U/L - 100 90  ?AST 15 - 41 U/L - 22 16  ?ALT 0 - 44 U/L - 28 15  ? ? ?  Past Medical History:  ?Diagnosis Date  ? Asthma   ? Blood transfusion   ? Depression   ? GERD (gastroesophageal reflux disease)   ? Hepatitis A   ? Hypertension   ? Jaundice   ? Migraines   ? Ovarian cyst   ? UTI (urinary tract infection)   ? ? ? ?Past Surgical History:  ?Procedure Laterality Date  ? APPENDECTOMY  1994  ? OPEN REDUCTION INTERNAL FIXATION (ORIF) METACARPAL Right 08/15/2016  ? Procedure: OPEN REDUCTION INTERNAL FIXATION (ORIF) right ring finger middle phalanx fracture;  Surgeon: Leanora Cover, MD;  Location: [place];  Service: Orthopedics;  Laterality: Right;  ? OVARIAN CYST REMOVAL    ? TONSILLECTOMY  AND ADENOIDECTOMY    ? ?Family History  ?Problem Relation Age of Onset  ? Asthma Son   ? Hypertension Other   ? Crohn's disease Father   ? Crohn's disease Maternal Aunt   ? Esophageal cancer Maternal Uncle   ? Breast cancer Maternal Grandmother   ? ?Social History  ? ?Tobacco Use  ? Smoking status: Never  ? Smokeless tobacco: Never  ?Vaping Use  ? Vaping Use: Never used  ?Substance Use Topics  ? Alcohol use: Yes  ?  Comment: several times monthly  ? Drug use: No  ? ?Current Outpatient Medications  ?Medication Sig Dispense Refill  ? albuterol (PROVENTIL HFA;VENTOLIN HFA) 108 (90 Base) MCG/ACT inhaler Inhale 2 puffs into the lungs every 6 (six) hours as needed for wheezing or shortness of breath. 1 Inhaler 2  ? ALPRAZolam (XANAX) 0.25 MG tablet Take 0.25 mg by mouth daily as needed for anxiety.    ? amLODipine (NORVASC) 10 MG tablet Take 1 tablet (10 mg total) by mouth daily. 30 tablet 11  ? amphetamine-dextroamphetamine (ADDERALL XR) 25 MG 24 hr capsule Take 25 mg by mouth 2 (two) times daily.    ? diphenhydrAMINE (BENADRYL) 25 MG tablet Take 50 mg by mouth every 6 (six) hours as needed for itching.    ? escitalopram (LEXAPRO) 10 MG tablet Take 1 tablet (10 mg total) by mouth daily. 30 tablet 5  ? ferrous sulfate 325 (65 FE) MG tablet Take 1 tablet (325 mg total) by mouth 2 (two) times daily with a meal. 30 tablet 3  ? hydrOXYzine (ATARAX) 25 MG tablet Take 1 tablet (25 mg total) by mouth every 4 (four) hours as needed for itching. 30 tablet 0  ? permethrin (ELIMITE) 5 % cream Apply cream to body.  Shower 8 to 12 hours after application.  Can repeat in 1 week. 60 g 1  ? QVAR REDIHALER 80 MCG/ACT inhaler INHALE 1 PUFF INTO THE LUNGS TWICE DAILY 10.6 g 0  ? Triamcinolone Acetonide (TRIAMCINOLONE 0.1 % CREAM : EUCERIN) CREA Apply 1 application topically 3 (three) times daily as needed for itching, rash or irritation. 1 each 1  ? ?No current facility-administered medications for this visit.  ? ?Allergies  ?Allergen  Reactions  ? Aspirin Shortness Of Breath  ?  REACTION: wheezing problems  ? Codeine Sulfate Hives and Nausea And Vomiting  ?  REACTION: rash, itiching  ? Hydrocodone-Acetaminophen Itching and Rash  ? ? ? ?Review of Systems: ?All systems reviewed and negative except where noted in HPI.  ? ? ?PHYSICAL EXAM :   ? ?Wt Readings from Last 3 Encounters:  ?12/28/21 179 lb 9.6 oz (81.5 kg)  ?11/06/21 160 lb (72.6 kg)  ?10/14/21 180 lb 1.6 oz (81.7 kg)  ? ? ?BP Marland Kitchen)  142/86   Pulse 71   Ht 5\' 6"  (1.676 m)   Wt 180 lb (81.6 kg)   BMI 29.05 kg/m?  ?Constitutional:  Generally well appearing female in no acute distress. ?Psychiatric: Pleasant. Normal mood and affect. Behavior is normal. ?EENT: Pupils normal.  Conjunctivae are normal. No scleral icterus. ?Neck supple.  ?Cardiovascular: Normal rate, regular rhythm. No edema ?Pulmonary/chest: Effort normal and breath sounds normal. No wheezing, rales or rhonchi. ?Abdominal: Soft, nondistended, nontender. Bowel sounds active throughout. There are no masses palpable. No hepatomegaly. ?Neurological: Alert and oriented to person place and time. ?Skin: Muliple superficial skin excoriations on abdomen, arms and legs.  ? ?Tye Savoy, NP  01/02/2022, 8:29 AM ? ? ? ? ? ? ? ? ?

## 2022-01-02 NOTE — Telephone Encounter (Signed)
Patient returned call. Pt has been r/s with Dr. Christella Hartigan on 02/18/22 at 3:00pm. New instructions will be sent to patient via my chart.  ?

## 2022-01-02 NOTE — Telephone Encounter (Signed)
Called and left message for pt. Her Colonoscopy needs to be r/s to Dr Ardis Hughs per Nevin Bloodgood -NP  ?

## 2022-01-04 ENCOUNTER — Telehealth: Payer: Self-pay

## 2022-01-04 NOTE — Telephone Encounter (Signed)
Patient called stating she has b/p reading of 193/101 and would like to know what she should do call was transferred to triage nurse ? ?

## 2022-01-07 ENCOUNTER — Other Ambulatory Visit: Payer: Self-pay

## 2022-01-07 DIAGNOSIS — I10 Essential (primary) hypertension: Secondary | ICD-10-CM

## 2022-01-07 MED ORDER — LOSARTAN POTASSIUM 100 MG PO TABS: 100.0000 mg | ORAL_TABLET | Freq: Every day | ORAL | 0 refills | Status: DC

## 2022-01-07 NOTE — Telephone Encounter (Signed)
Spoke with patient her latest b/p reading was 151/81.   Patient said that yes, she has been taking Amlodipine 10mg .   Losartan 100 mg sent to [facility] pharmacy.   Office visit appointment scheduled for 01/21/2022 ?

## 2022-01-07 NOTE — Telephone Encounter (Signed)
--  Pt has BP of 193/101. She was given BP Monday ?and it is not working all the time. She was [id number] systolic ?earlier today. No fever. ? ?01/04/2022 4:26:44 PM Go to ED Now Yes Martyn Ehrich, RN, [place] [facility] ? ?Referrals ?[facility] ? ?01/07/22 1138: Attempted to call pt to give her Dr Erick Blinks message. ?

## 2022-01-09 ENCOUNTER — Encounter: Payer: Self-pay | Admitting: Family Medicine

## 2022-01-10 ENCOUNTER — Encounter: Payer: Self-pay | Admitting: Family Medicine

## 2022-01-11 ENCOUNTER — Encounter: Payer: Self-pay | Admitting: Family Medicine

## 2022-01-14 ENCOUNTER — Ambulatory Visit (INDEPENDENT_AMBULATORY_CARE_PROVIDER_SITE_OTHER): Payer: Self-pay | Admitting: Family Medicine

## 2022-01-14 ENCOUNTER — Encounter: Payer: Self-pay | Admitting: Family Medicine

## 2022-01-14 VITALS — BP 158/70 | HR 104 | Temp 97.6°F | Ht 66.0 in | Wt 173.5 lb

## 2022-01-14 DIAGNOSIS — R21 Rash and other nonspecific skin eruption: Secondary | ICD-10-CM

## 2022-01-14 DIAGNOSIS — L280 Lichen simplex chronicus: Secondary | ICD-10-CM

## 2022-01-14 MED ORDER — ALBENDAZOLE 200 MG PO TABS: ORAL_TABLET | ORAL | 0 refills | Status: DC

## 2022-01-14 MED ORDER — ARIPIPRAZOLE 2 MG PO TABS: 2.0000 mg | ORAL_TABLET | Freq: Every day | ORAL | 0 refills | Status: DC

## 2022-01-14 NOTE — Patient Instructions (Signed)
Return tomorrow for blood work and lab test for stool ova and parasites.  ? ?Keep wound dry for the first 24 hours then clean daily with soap and water for one week. ?Apply vaseline daily for 3-4 days. ?Keep covered with clean dressing for 4-5 days. ?Follow up promptly for any signs of infection such as redness, warmth, pain, or drainage. ?Return in one week for suture removal.  ? ?Start the Abilify tonight!   ?

## 2022-01-14 NOTE — Progress Notes (Signed)
Established Patient Office Visit  Subjective:  Patient ID: [name], female    DOB: [birth year]  Age: 56 y.o. MRN: [id number]  CC:  Chief Complaint  Patient presents with   Follow-up    HPI ZADA HASER presents for ongoing concerns for parasite infection.  Refer to previous note from February 24 for details.  Longstanding history of poor compliance with primary care follow-up.  She had multiple ER and urgent care visits recently.  Her major issue has been severe pruritus.  She states that she was doing some rescue care for her dogs and she is fairly certain she picked up some type of parasite.  She was apparently treated multiple times for scabies with permethrin topically and then subsequently ivermectin.  She states that she has seen visible "worms "especially on her legs.  She states that these go through her skin before she can collect them.  She states that since treating with ivermectin her symptoms have improved slightly.  She is also used triamcinolone/Eucerin cream which is helped somewhat.  She took hydroxyzine which did not seem to help much.   In the course of one of her ER visit she complained of increased craving for salt and ice.  She had decreased appetite.  She had lab work which revealed severe anemia with hemoglobin of 6.  She received blood transfusion with hemoglobin 9.1.  She has pending follow-up in 1 week with GI.  She states she has never had colonoscopy.  Denies any bloody stools.  No melena.  She does have history of severe reflux and vomiting but no hematemesis.  Currently denies any dizziness.  No vaginal bleeding.   History of hypertension.  Had been on amlodipine in the past as well as losartan HCTZ.  Currently not taking anything.  Blood pressure is extremely high today.  She complains of severe anxiety.  She had very stressful past couple years.  Currently unemployed.  She is up selling her townhome.  She is living with her mother currently.  She  underwent separation and divorce over the past year.  This has naturally been very stressful for her.   She has history of severe asthma.  Currently not using any regular inhalers.  Denies any recent flares with her asthma.  Very poor compliance with inhalers previously  She has seen GI in the meantime.  Her main concern though by far is her ongoing skin rash.  She has multiple excoriations on her hands, forearms, arms, lower legs bilaterally.  She brings in a couple of bags of hair that she has pulled out and states that she is seeing "worms "in her hair and crawling in and out of her skin.  No one around her has been able to see things that she has been concerned with.  After doing much reading, she was specifically concerned about possibility of hookworm.  She has not had any diarrhea.  No recent travels.  She apparently has taken medication such as ivermectin without any benefit.  No reported fever. Recent BCs have not shown any significant eosinophilia.    Past Medical History:  Diagnosis Date   Asthma    Blood transfusion    Depression    GERD (gastroesophageal reflux disease)    Hepatitis A    Hypertension    Jaundice    Migraines    Ovarian cyst    UTI (urinary tract infection)     Past Surgical History:  Procedure Laterality Date   APPENDECTOMY  1994   OPEN REDUCTION INTERNAL FIXATION (ORIF) METACARPAL Right 08/15/2016   Procedure: OPEN REDUCTION INTERNAL FIXATION (ORIF) right ring finger middle phalanx fracture;  Surgeon: Leanora Cover, MD;  Location: [place];  Service: Orthopedics;  Laterality: Right;   OVARIAN CYST REMOVAL     TONSILLECTOMY AND ADENOIDECTOMY      Family History  Problem Relation Age of Onset   Asthma Son    Hypertension Other    Crohn's disease Father    Crohn's disease Maternal Aunt    Esophageal cancer Maternal Uncle    Breast cancer Maternal Grandmother     Social History   Socioeconomic History   Marital status: Married     Spouse name: Not on file   Number of children: 2   Years of education: Not on file   Highest education level: Not on file  Occupational History   Occupation: Production assistant, 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    Employer: [employer]  Tobacco Use   Smoking status: Never   Smokeless tobacco: Never  Vaping Use   Vaping Use: Never used  Substance and Sexual Activity   Alcohol use: Not Currently    Comment: several times monthly   Drug use: No   Sexual activity: Yes    Birth control/protection: None    Comment: husband has had a vasectomy  Other Topics Concern   Not on file  Social History Narrative   Not on file   Social Determinants of Health   Financial Resource Strain: Not on file  Food Insecurity: Not on file  Transportation Needs: Not on file  Physical Activity: Not on file  Stress: Not on file  Social Connections: Not on file  Intimate Partner Violence: Not on file    Outpatient Medications Prior to Visit  Medication Sig Dispense Refill   albuterol (PROVENTIL HFA;VENTOLIN HFA) 108 (90 Base) MCG/ACT inhaler Inhale 2 puffs into the lungs every 6 (six) hours as needed for wheezing or shortness of breath. 1 Inhaler 2   amLODipine (NORVASC) 10 MG tablet Take 1 tablet (10 mg total) by mouth daily. 30 tablet 11   Ascorbic Acid (VITAMIN C) 100 MG tablet Take 100 mg by mouth daily.     cephALEXin (KEFLEX) 500 MG capsule Take 500 mg by mouth 4 (four) times daily.     diphenhydrAMINE (BENADRYL) 25 MG tablet Take 50 mg by mouth every 6 (six) hours as needed for itching.     escitalopram (LEXAPRO) 10 MG tablet Take 1 tablet (10 mg total) by mouth daily. 30 tablet 5   esomeprazole (NEXIUM) 40 MG capsule Take 40 mg by mouth daily at 12 noon.     ferrous sulfate 325 (65 FE) MG tablet Take 1 tablet (325 mg total) by mouth 2 (two) times daily with a meal. 30 tablet 3   hydrOXYzine (ATARAX) 25 MG tablet Take 1 tablet (25 mg total) by mouth every 4 (four) hours as needed for itching. 30  tablet 0   losartan (COZAAR) 100 MG tablet Take 1 tablet (100 mg total) by mouth daily. 90 tablet 0   Na Sulfate-K Sulfate-Mg Sulf (SUPREP BOWEL PREP KIT) 17.5-3.13-1.6 GM/177ML SOLN Take 1 kit by mouth as directed. For colonoscopy prep 354 mL 0   permethrin (ELIMITE) 5 % cream Apply cream to body.  Shower 8 to 12 hours after application.  Can repeat in 1 week. 60 g 1   No facility-administered medications prior to visit.  Allergies  Allergen Reactions   Aspirin Shortness Of Breath    REACTION: wheezing problems   Codeine Sulfate Hives and Nausea And Vomiting    REACTION: rash, itiching   Hydrocodone-Acetaminophen Itching and Rash    ROS Review of Systems  Constitutional:  Negative for chills and fever.  Respiratory:  Negative for shortness of breath.   Gastrointestinal:  Negative for abdominal pain, diarrhea, nausea and vomiting.  Skin:  Positive for rash.     Objective:    Physical Exam Vitals reviewed.  Constitutional:      Comments: Somewhat anxious in appearance.  Cardiovascular:     Rate and Rhythm: Normal rate and regular rhythm.  Pulmonary:     Effort: Pulmonary effort is normal.     Breath sounds: Normal breath sounds.  Skin:    Comments: Multiple excoriations on her skin especially lower legs forearms wrist.  She has a couple areas that are bleeding slightly to where she is agitated the skin frequently.  No pustules.  No cellulitis changes.  Neurological:     Mental Status: She is alert.    BP (!) 158/70 (BP Location: Left Arm, Patient Position: Sitting, Cuff Size: Normal)    Pulse (!) 104    Temp 97.6 F (36.4 C) (Oral)    Ht '5\' 6"'  (1.676 m)    Wt 173 lb 8 oz (78.7 kg)    SpO2 98%    BMI 28.00 kg/m  Wt Readings from Last 3 Encounters:  01/14/22 173 lb 8 oz (78.7 kg)  01/02/22 180 lb (81.6 kg)  12/28/21 179 lb 9.6 oz (81.5 kg)     Health Maintenance Due  Topic Date Due   COVID-19 Vaccine (1) Never done   Hepatitis C Screening  Never done    TETANUS/TDAP  Never done   PAP SMEAR-Modifier  Never done   COLONOSCOPY (Pts 45-30yr Insurance coverage will need to be confirmed)  Never done   MAMMOGRAM  Never done   Zoster Vaccines- Shingrix (1 of 2) Never done   INFLUENZA VACCINE  06/04/2021    There are no preventive care reminders to display for this patient.  Lab Results  Component Value Date   TSH 1.776 11/06/2021   Lab Results  Component Value Date   WBC 11.5 (H) 11/06/2021   HGB 9.1 (L) 11/06/2021   HCT 30.7 (L) 11/06/2021   MCV 65.6 (L) 11/06/2021   PLT 453 (H) 11/06/2021   Lab Results  Component Value Date   NA 136 11/06/2021   K 2.9 (L) 11/06/2021   CO2 22 11/06/2021   GLUCOSE 118 (H) 11/06/2021   BUN 17 11/06/2021   CREATININE 0.68 11/06/2021   BILITOT 0.5 11/05/2021   ALKPHOS 100 11/05/2021   AST 22 11/05/2021   ALT 28 11/05/2021   PROT 7.3 11/05/2021   ALBUMIN 3.4 (L) 11/05/2021   CALCIUM 8.4 (L) 11/06/2021   ANIONGAP 10 11/06/2021   No results found for: CHOL No results found for: HDL No results found for: LDLCALC No results found for: TRIG No results found for: CHOLHDL No results found for: HGBA1C    Assessment & Plan:   Patient relates several week history of skin rash.  She is totally convinced that she has a severe parasite infection.  She apparently has been tried on multiple antiparasite medications without improvement.  She has multiple excoriations and has significant level of anxiety currently.  Recent CBCs have not shown clues such as eosinophilia. ?  Delusional infestation  -We  did discuss further evaluation including: -Repeat CBC to look for eosinophilia -Stool for ova and parasites -We discussed risk and benefits of skin punch biopsy including risk of bleeding, low risk of infection, low risk of scarring.  Patient consented.  We prepped skin with alcohol.  Local anesthesia with 1% plain Xylocaine.  After obtaining full anesthesia using number 4 mm punch biopsy and went through the  epidermis and dermis full-thickness.  Minimal bleeding.  1 suture of 4-0 Ethilon to approximate the wound edges.  Topical Vaseline and bandage applied. -Wound care instruction given -Specimen sent to [facility] -Return in 1 week for suture removal  -We did discuss possible empiric treatment with albendazole 200 mg 2 tablets x 1 dose pending results above  -She has significant anxiety symptoms and very poor sleep quality at this time.  We do have significant concerns as above.  We did discuss possible initiating treatment with Abilify 2 mg nightly.  Reassess in 1 week.  Low threshold to consider further titration then versus psychiatry referral if above testing unrevealing.   Meds ordered this encounter  Medications   albendazole (ALBENZA) 200 MG tablet    Sig: Take two tablets by mouth one dose    Dispense:  2 tablet    Refill:  0   ARIPiprazole (ABILIFY) 2 MG tablet    Sig: Take 1 tablet (2 mg total) by mouth daily.    Dispense:  30 tablet    Refill:  0    Follow-up: No follow-ups on file.    Carolann Littler, MD

## 2022-01-15 ENCOUNTER — Encounter: Payer: Self-pay | Admitting: Family Medicine

## 2022-01-15 ENCOUNTER — Other Ambulatory Visit (INDEPENDENT_AMBULATORY_CARE_PROVIDER_SITE_OTHER): Payer: Self-pay

## 2022-01-15 ENCOUNTER — Other Ambulatory Visit: Payer: Self-pay

## 2022-01-15 DIAGNOSIS — R21 Rash and other nonspecific skin eruption: Secondary | ICD-10-CM

## 2022-01-15 LAB — CBC WITH DIFFERENTIAL/PLATELET
Basophils Absolute: 0.1 10*3/uL (ref 0.0–0.1)
Basophils Relative: 0.6 % (ref 0.0–3.0)
Eosinophils Absolute: 0.3 10*3/uL (ref 0.0–0.7)
Eosinophils Relative: 3.9 % (ref 0.0–5.0)
HCT: 39.7 % (ref 36.0–46.0)
Hemoglobin: 13.2 g/dL (ref 12.0–15.0)
Lymphocytes Relative: 40.1 % (ref 12.0–46.0)
Lymphs Abs: 3.5 10*3/uL (ref 0.7–4.0)
MCHC: 33.2 g/dL (ref 30.0–36.0)
MCV: 84.2 fl (ref 78.0–100.0)
Monocytes Absolute: 0.6 10*3/uL (ref 0.1–1.0)
Monocytes Relative: 7.4 % (ref 3.0–12.0)
Neutro Abs: 4.2 10*3/uL (ref 1.4–7.7)
Neutrophils Relative %: 48 % (ref 43.0–77.0)
Platelets: 392 10*3/uL (ref 150.0–400.0)
RBC: 4.71 Mil/uL (ref 3.87–5.11)
RDW: 24 % — ABNORMAL HIGH (ref 11.5–15.5)
WBC: 8.8 10*3/uL (ref 4.0–10.5)

## 2022-01-16 ENCOUNTER — Encounter: Payer: Self-pay | Admitting: Family Medicine

## 2022-01-16 ENCOUNTER — Other Ambulatory Visit: Payer: Self-pay

## 2022-01-16 DIAGNOSIS — R21 Rash and other nonspecific skin eruption: Secondary | ICD-10-CM

## 2022-01-18 ENCOUNTER — Telehealth: Payer: Self-pay | Admitting: Family Medicine

## 2022-01-18 NOTE — Telephone Encounter (Signed)
Pt aware that results have not been resulted. ?

## 2022-01-18 NOTE — Telephone Encounter (Signed)
Pt is calling and would like stool samples results ?

## 2022-01-21 ENCOUNTER — Encounter: Payer: Self-pay | Admitting: Family Medicine

## 2022-01-21 ENCOUNTER — Ambulatory Visit (INDEPENDENT_AMBULATORY_CARE_PROVIDER_SITE_OTHER): Payer: Self-pay | Admitting: Family Medicine

## 2022-01-21 VITALS — BP 164/96 | HR 72 | Temp 97.8°F | Ht 66.0 in | Wt 175.1 lb

## 2022-01-21 DIAGNOSIS — F419 Anxiety disorder, unspecified: Secondary | ICD-10-CM

## 2022-01-21 DIAGNOSIS — L280 Lichen simplex chronicus: Secondary | ICD-10-CM

## 2022-01-21 MED ORDER — TRIAMCINOLONE ACETONIDE 0.1 % EX CREA: 1.0000 "application " | TOPICAL_CREAM | Freq: Two times a day (BID) | CUTANEOUS | 1 refills | Status: AC

## 2022-01-21 NOTE — Progress Notes (Signed)
? ?Established Patient Office Visit ? ?Subjective:  ?Patient ID: [name], female    DOB: 1966-07-16  Age: 56 y.o. MRN: [id number] ? ?CC:  ?Chief Complaint  ?Patient presents with  ? Follow-up  ? ? ?HPI ?[name] presents for follow-up for recent skin biopsy.  Refer to previous note for details.  Several week history of pruritus and excoriations and what she is describing as visible "worms" ?going in and out of her skin.  These have never been viewed by anyone else.  She had been seen a couple different times in the ER and had been on multiple courses of medication such as ivermectin without relief.  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  No evidence for any parasites.  We did send stool culture for O&P and that is still pending.  Repeat CBC again showed no eosinophilia.  Her hemoglobin had improved to 13 range.   ? ?She continues to fixate on her belief that she has severe parasitic infection.  We did start low-dose Abilify 2 mg at night and she states she is taking that.  We had mention possible psychiatry referral. ? ?Past Medical History:  ?Diagnosis Date  ? Asthma   ? Blood transfusion   ? Depression   ? GERD (gastroesophageal reflux disease)   ? Hepatitis A   ? Hypertension   ? Jaundice   ? Migraines   ? Ovarian cyst   ? UTI (urinary tract infection)   ? ? ?Past Surgical History:  ?Procedure Laterality Date  ? APPENDECTOMY  1994  ? OPEN REDUCTION INTERNAL FIXATION (ORIF) METACARPAL Right 08/15/2016  ? Procedure: OPEN REDUCTION INTERNAL FIXATION (ORIF) right ring finger middle phalanx fracture;  Surgeon: Leanora Cover, MD;  Location: [place];  Service: Orthopedics;  Laterality: Right;  ? OVARIAN CYST REMOVAL    ? TONSILLECTOMY AND ADENOIDECTOMY    ? ? ?Family History  ?Problem Relation Age of Onset  ? Asthma Son   ? Hypertension Other   ? Crohn's disease  Father   ? Crohn's disease Maternal Aunt   ? Esophageal cancer Maternal Uncle   ? Breast cancer Maternal Grandmother   ? ? ?Social History  ? ?Socioeconomic History  ? Marital status: Married  ?  Spouse name: Not on file  ? Number of children: 2  ? Years of education: Not on file  ? Highest education level: Not on file  ?Occupational History  ? Occupation: Production assistant, radio Rep for [employer]  ?  Employer: [employer]  ?Tobacco Use  ? Smoking status: Never  ? Smokeless tobacco: Never  ?Vaping Use  ? Vaping Use: Never used  ?Substance and Sexual Activity  ? Alcohol use: Not Currently  ?  Comment: several times monthly  ? Drug use: No  ? Sexual activity: Yes  ?  Birth control/protection: None  ?  Comment: husband has had a vasectomy  ?Other Topics Concern  ? Not on file  ?Social History Narrative  ? Not on file  ? ?Social Determinants of Health  ? ?Financial Resource Strain: Not on file  ?Food Insecurity: Not on file  ?Transportation Needs: Not on file  ?Physical Activity: Not on file  ?Stress: Not on file  ?Social Connections: Not on file  ?Intimate Partner Violence: Not on file  ? ? ?Outpatient Medications Prior to Visit  ?  Medication Sig Dispense Refill  ? albendazole (ALBENZA) 200 MG tablet Take two tablets by mouth one dose 2 tablet 0  ? albuterol (PROVENTIL HFA;VENTOLIN HFA) 108 (90 Base) MCG/ACT inhaler Inhale 2 puffs into the lungs every 6 (six) hours as needed for wheezing or shortness of breath. 1 Inhaler 2  ? amLODipine (NORVASC) 10 MG tablet Take 1 tablet (10 mg total) by mouth daily. 30 tablet 11  ? ARIPiprazole (ABILIFY) 2 MG tablet Take 1 tablet (2 mg total) by mouth daily. 30 tablet 0  ? Ascorbic Acid (VITAMIN C) 100 MG tablet Take 100 mg by mouth daily.    ? cephALEXin (KEFLEX) 500 MG capsule Take 500 mg by mouth 4 (four) times daily.    ? diphenhydrAMINE (BENADRYL) 25 MG tablet Take 50 mg by mouth every 6 (six) hours as needed for itching.    ? escitalopram (LEXAPRO) 10 MG tablet Take 1  tablet (10 mg total) by mouth daily. 30 tablet 5  ? esomeprazole (NEXIUM) 40 MG capsule Take 40 mg by mouth daily at 12 noon.    ? ferrous sulfate 325 (65 FE) MG tablet Take 1 tablet (325 mg total) by mouth 2 (two) times daily with a meal. 30 tablet 3  ? hydrOXYzine (ATARAX) 25 MG tablet Take 1 tablet (25 mg total) by mouth every 4 (four) hours as needed for itching. 30 tablet 0  ? losartan (COZAAR) 100 MG tablet Take 1 tablet (100 mg total) by mouth daily. 90 tablet 0  ? Na Sulfate-K Sulfate-Mg Sulf (SUPREP BOWEL PREP KIT) 17.5-3.13-1.6 GM/177ML SOLN Take 1 kit by mouth as directed. For colonoscopy prep 354 mL 0  ? permethrin (ELIMITE) 5 % cream Apply cream to body.  Shower 8 to 12 hours after application.  Can repeat in 1 week. 60 g 1  ? ?No facility-administered medications prior to visit.  ? ? ?Allergies  ?Allergen Reactions  ? Aspirin Shortness Of Breath  ?  REACTION: wheezing problems  ? Codeine Sulfate Hives and Nausea And Vomiting  ?  REACTION: rash, itiching  ? Hydrocodone-Acetaminophen Itching and Rash  ? ? ?ROS ?Review of Systems  ?Constitutional:  Negative for chills and fever.  ?Psychiatric/Behavioral:  The patient is nervous/anxious.   ?     See HPI  ? ?  ?Objective:  ?  ?Physical Exam ?Vitals reviewed.  ?Cardiovascular:  ?   Rate and Rhythm: Normal rate and regular rhythm.  ?Skin: ?   Comments: Multiple excoriations on her forearms and legs, feet, ankles.  Biopsy site left forearm healing well.  1 suture removed without difficulty.  No signs of secondary infection.  ? ? ?BP (!) 164/96 (BP Location: Left Arm, Patient Position: Sitting, Cuff Size: Normal)   Pulse 72   Temp 97.8 ?F (36.6 ?C) (Oral)   Ht 5' 6" (1.676 m)   Wt 175 lb 1.6 oz (79.4 kg)   SpO2 99%   BMI 28.26 kg/m?  ?Wt Readings from Last 3 Encounters:  ?01/21/22 175 lb 1.6 oz (79.4 kg)  ?01/14/22 173 lb 8 oz (78.7 kg)  ?01/02/22 180 lb (81.6 kg)  ? ? ? ?Health Maintenance Due  ?Topic Date Due  ? COVID-19 Vaccine (1) Never done  ?  Hepatitis C Screening  Never done  ? TETANUS/TDAP  Never done  ? PAP SMEAR-Modifier  Never done  ? COLONOSCOPY (Pts 45-49yrs Insurance coverage will need to be confirmed)  Never done  ? MAMMOGRAM  Never done  ? Zoster Vaccines- Shingrix (1   of 2) Never done  ? INFLUENZA VACCINE  06/04/2021  ? ? ?There are no preventive care reminders to display for this patient. ? ?Lab Results  ?Component Value Date  ? TSH 1.776 11/06/2021  ? ?Lab Results  ?Component Value Date  ? WBC 8.8 01/15/2022  ? HGB 13.2 01/15/2022  ? HCT 39.7 01/15/2022  ? MCV 84.2 01/15/2022  ? PLT 392.0 01/15/2022  ? ?Lab Results  ?Component Value Date  ? NA 136 11/06/2021  ? K 2.9 (L) 11/06/2021  ? CO2 22 11/06/2021  ? GLUCOSE 118 (H) 11/06/2021  ? BUN 17 11/06/2021  ? CREATININE 0.68 11/06/2021  ? BILITOT 0.5 11/05/2021  ? ALKPHOS 100 11/05/2021  ? AST 22 11/05/2021  ? ALT 28 11/05/2021  ? PROT 7.3 11/05/2021  ? ALBUMIN 3.4 (L) 11/05/2021  ? CALCIUM 8.4 (L) 11/06/2021  ? ANIONGAP 10 11/06/2021  ? ?No results found for: CHOL ?No results found for: HDL ?No results found for: LDLCALC ?No results found for: TRIG ?No results found for: CHOLHDL ?No results found for: HGBA1C ? ?  ?Assessment & Plan:  ? 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  Patient has multiple excoriations and she has firm belief that she has parasite infestation.  CBCs have shown no eosinophilia.  Recent skin biopsy showed lichen simplex chronicus but no evidence for parasite.  Stool O&P pending but suspect this will probably be negative as she has not had any significant diarrhea symptoms. ?-We did write for triamcinolone 0.1% cream to use twice daily as needed ?-We do have concerns for possible delusional infestation and have recommended psychiatry referral and go ahead and double up her Abilify to 4 mg nightly ? ?#2 history of iron deficiency anemia.  Recent hemoglobin had improved.  She has GI work-up pending. ? ?We spent 30 minutes with patient and mother discussing recent  evaluations, reviewing labs, biopsy, previous treatments tried and failed. ? ?We did discuss the fact that we did not feel like further antiparasite medications were indicated at this time. ? ? ?Meds ordered this enco

## 2022-01-21 NOTE — Patient Instructions (Signed)
Increase the Abilify to 2 tablets ( 4 mg every night)    ?

## 2022-01-23 ENCOUNTER — Encounter: Payer: Self-pay | Admitting: Family Medicine

## 2022-01-23 LAB — OVA AND PARASITE EXAMINATION
CONCENTRATE RESULT:: NONE SEEN
MICRO NUMBER:: 13134176
SPECIMEN QUALITY:: ADEQUATE
TRICHROME RESULT:: NONE SEEN

## 2022-02-06 ENCOUNTER — Encounter: Payer: Self-pay | Admitting: Internal Medicine

## 2022-02-16 ENCOUNTER — Encounter: Payer: Self-pay | Admitting: Family Medicine

## 2022-02-18 ENCOUNTER — Telehealth: Payer: Self-pay | Admitting: Gastroenterology

## 2022-02-18 ENCOUNTER — Encounter: Payer: Self-pay | Admitting: Gastroenterology

## 2022-02-18 NOTE — Telephone Encounter (Signed)
Called pt-- NO answer-- Left message on cell phone to have pt call my direct number back to let us know if she is coming in for her double procedure.  ?

## 2022-02-21 ENCOUNTER — Other Ambulatory Visit: Payer: Self-pay

## 2022-02-21 ENCOUNTER — Emergency Department (HOSPITAL_COMMUNITY)
Admission: EM | Admit: 2022-02-21 | Discharge: 2022-02-21 | Disposition: A | Discharge status: Home / Self Care | Payer: Self-pay | Attending: Emergency Medicine | Admitting: Emergency Medicine

## 2022-02-21 ENCOUNTER — Encounter (HOSPITAL_COMMUNITY): Payer: Self-pay | Admitting: Emergency Medicine

## 2022-02-21 DIAGNOSIS — F424 Excoriation (skin-picking) disorder: Secondary | ICD-10-CM

## 2022-02-21 DIAGNOSIS — Z79899 Other long term (current) drug therapy: Secondary | ICD-10-CM | POA: Insufficient documentation

## 2022-02-21 DIAGNOSIS — L439 Lichen planus, unspecified: Secondary | ICD-10-CM

## 2022-02-21 DIAGNOSIS — F419 Anxiety disorder, unspecified: Secondary | ICD-10-CM | POA: Insufficient documentation

## 2022-02-21 LAB — URINALYSIS, ROUTINE W REFLEX MICROSCOPIC
Bilirubin Urine: NEGATIVE
Glucose, UA: NEGATIVE mg/dL
Hgb urine dipstick: NEGATIVE
Ketones, ur: NEGATIVE mg/dL
Nitrite: NEGATIVE
Protein, ur: NEGATIVE mg/dL
Specific Gravity, Urine: 1.018 (ref 1.005–1.030)
pH: 5 (ref 5.0–8.0)

## 2022-02-21 LAB — COMPREHENSIVE METABOLIC PANEL
ALT: 32 U/L (ref 0–44)
AST: 25 U/L (ref 15–41)
Albumin: 3.9 g/dL (ref 3.5–5.0)
Alkaline Phosphatase: 90 U/L (ref 38–126)
Anion gap: 9 (ref 5–15)
BUN: 17 mg/dL (ref 6–20)
CO2: 24 mmol/L (ref 22–32)
Calcium: 9.1 mg/dL (ref 8.9–10.3)
Chloride: 104 mmol/L (ref 98–111)
Creatinine, Ser: 0.98 mg/dL (ref 0.44–1.00)
GFR, Estimated: >60 mL/min (ref >60)
Glucose, Bld: 112 mg/dL — ABNORMAL HIGH (ref 70–99)
Potassium: 4 mmol/L (ref 3.5–5.1)
Sodium: 137 mmol/L (ref 135–145)
Total Bilirubin: 0.6 mg/dL (ref 0.3–1.2)
Total Protein: 7.7 g/dL (ref 6.5–8.1)

## 2022-02-21 LAB — CBC WITH DIFFERENTIAL/PLATELET
Abs Immature Granulocytes: 0.03 10*3/uL (ref 0.00–0.07)
Basophils Absolute: 0.1 10*3/uL (ref 0.0–0.1)
Basophils Relative: 1 %
Eosinophils Absolute: 0.2 10*3/uL (ref 0.0–0.5)
Eosinophils Relative: 2 %
HCT: 44.4 % (ref 36.0–46.0)
Hemoglobin: 14.6 g/dL (ref 12.0–15.0)
Immature Granulocytes: 0 %
Lymphocytes Relative: 34 %
Lymphs Abs: 3.8 10*3/uL (ref 0.7–4.0)
MCH: 29 pg (ref 26.0–34.0)
MCHC: 32.9 g/dL (ref 30.0–36.0)
MCV: 88.3 fL (ref 80.0–100.0)
Monocytes Absolute: 0.8 10*3/uL (ref 0.1–1.0)
Monocytes Relative: 7 %
Neutro Abs: 6.1 10*3/uL (ref 1.7–7.7)
Neutrophils Relative %: 56 %
Platelets: 394 10*3/uL (ref 150–400)
RBC: 5.03 MIL/uL (ref 3.87–5.11)
RDW: 13.2 % (ref 11.5–15.5)
WBC: 11 10*3/uL — ABNORMAL HIGH (ref 4.0–10.5)
nRBC: 0 % (ref 0.0–0.2)

## 2022-02-21 LAB — ETHANOL: Alcohol, Ethyl (B): <10 mg/dL (ref <10)

## 2022-02-21 LAB — RAPID URINE DRUG SCREEN, HOSP PERFORMED
Amphetamines: POSITIVE — AB
Barbiturates: NOT DETECTED
Benzodiazepines: NOT DETECTED
Cocaine: NOT DETECTED
Opiates: NOT DETECTED
Tetrahydrocannabinol: NOT DETECTED

## 2022-02-21 LAB — SALICYLATE LEVEL: Salicylate Lvl: <7 mg/dL — ABNORMAL LOW (ref 7.0–30.0)

## 2022-02-21 LAB — ACETAMINOPHEN LEVEL: Acetaminophen (Tylenol), Serum: <10 ug/mL — ABNORMAL LOW (ref 10–30)

## 2022-02-21 NOTE — ED Provider Notes (Signed)
?[facility] ?Provider Note ? ? ?CSN: [id number] ?Arrival date & time: 02/21/22  1139 ? ?  ? ?History ? ?Chief Complaint  ?Patient presents with  ? Rash  ? ? ?[name] is a 56 y.o. female. ? ?Pt is a 56 yo female who is concerned about a rash.  She has been to the ED, UC, and her pcp several times for the same.  Her has had a punch biopsy of her skin which showed lichen simplex chronicus.  She has had a stool culture for an O&P which showed no parasites.  She has not had any evidence of eosinophilia.  Her pcp has recommended abilify and a psych referral.  She has not started the abilify.  She said she is not crazy and does not want to leave here until we find out what is wrong.  She said she is badly infected.  She is showing me parasites on her hands and arms.  She said she can pull them out.  I don't see any. ? ? ?  ? ?Home Medications ?Prior to Admission medications   ?Medication Sig Start Date End Date Taking? Authorizing Provider  ?albuterol (PROVENTIL HFA;VENTOLIN HFA) 108 (90 Base) MCG/ACT inhaler Inhale 2 puffs into the lungs every 6 (six) hours as needed for wheezing or shortness of breath. 01/14/19  Yes Isaac Bliss, Rayford Halsted, MD  ?amLODipine (NORVASC) 10 MG tablet Take 1 tablet (10 mg total) by mouth daily. 12/28/21  Yes Burchette, Alinda Sierras, MD  ?amphetamine-dextroamphetamine (ADDERALL XR) 25 MG 24 hr capsule Take 25 mg by mouth 2 (two) times daily as needed (work). 02/13/22  Yes [provider]  ?Ascorbic Acid (VITAMIN C) 100 MG tablet Take 100 mg by mouth daily.   Yes [provider]  ?diphenhydrAMINE (BENADRYL) 25 MG tablet Take 50 mg by mouth every 6 (six) hours as needed for itching.   Yes [provider]  ?escitalopram (LEXAPRO) 10 MG tablet Take 1 tablet (10 mg total) by mouth daily. 12/28/21  Yes Burchette, Alinda Sierras, MD  ?esomeprazole (NEXIUM) 40 MG capsule Take 40 mg by mouth daily at 12 noon.   Yes [provider]   ?ferrous sulfate 325 (65 FE) MG tablet Take 1 tablet (325 mg total) by mouth 2 (two) times daily with a meal. 11/06/21  Yes Adhikari, Tamsen Meek, MD  ?losartan (COZAAR) 100 MG tablet Take 1 tablet (100 mg total) by mouth daily. 01/07/22  Yes Burchette, Alinda Sierras, MD  ?triamcinolone cream (KENALOG) 0.1 % Apply 1 application. topically 2 (two) times daily. 01/21/22  Yes Burchette, Alinda Sierras, MD  ?albendazole (ALBENZA) 200 MG tablet Take two tablets by mouth one dose ?Patient not taking: Reported on 02/21/2022 01/14/22   [name], MD  ?ARIPiprazole (ABILIFY) 2 MG tablet Take 1 tablet (2 mg total) by mouth daily. ?Patient not taking: Reported on 02/21/2022 01/14/22   [name], MD  ?hydrOXYzine (ATARAX) 25 MG tablet Take 1 tablet (25 mg total) by mouth every 4 (four) hours as needed for itching. ?Patient not taking: Reported on 02/21/2022 11/06/21   Shelly Coss, MD  ?Na Sulfate-K Sulfate-Mg Sulf (SUPREP BOWEL PREP KIT) 17.5-3.13-1.6 GM/177ML SOLN Take 1 kit by mouth as directed. For colonoscopy prep ?Patient not taking: Reported on 02/21/2022 01/02/22   Willia Craze, NP  ?losartan-hydrochlorothiazide (HYZAAR) 100-12.5 MG tablet TAKE 1 TABLET BY MOUTH ONCE DAILY ?Patient not taking: Reported on 01/18/2021 02/26/19 03/11/21  [name], MD  ?   ? ?  Allergies    ?Aspirin, Codeine sulfate, and Hydrocodone-acetaminophen   ? ?Review of Systems   ?Review of Systems  ?Skin:  Positive for rash.  ?All other systems reviewed and are negative. ? ?Physical Exam ?Updated Vital Signs ?BP (!) 182/116 (BP Location: Right Arm)   Pulse (!) 105   Temp 98.8 ?F (37.1 ?C) (Oral)   Resp 20   SpO2 99%  ?Physical Exam ?Vitals and nursing note reviewed.  ?Constitutional:   ?   Appearance: Normal appearance.  ?HENT:  ?   Head: Normocephalic and atraumatic.  ?   Right Ear: External ear normal.  ?   Left Ear: External ear normal.  ?   Nose: Nose normal.  ?   Mouth/Throat:  ?   Mouth: Mucous membranes are moist.  ?   Pharynx: Oropharynx  is clear.  ?Eyes:  ?   Extraocular Movements: Extraocular movements intact.  ?   Conjunctiva/sclera: Conjunctivae normal.  ?   Pupils: Pupils are equal, round, and reactive to light.  ?Cardiovascular:  ?   Rate and Rhythm: Normal rate and regular rhythm.  ?   Pulses: Normal pulses.  ?   Heart sounds: Normal heart sounds.  ?Pulmonary:  ?   Effort: Pulmonary effort is normal.  ?   Breath sounds: Normal breath sounds.  ?Abdominal:  ?   General: Abdomen is flat. Bowel sounds are normal.  ?   Palpations: Abdomen is soft.  ?Musculoskeletal:     ?   General: Normal range of motion.  ?   Cervical back: Normal range of motion and neck supple.  ?Skin: ?   General: Skin is warm.  ?   Capillary Refill: Capillary refill takes less than 2 seconds.  ?   Comments: Multiple areas of excoriation to hands and to arms.  No evidence of visible parasites.  ?Neurological:  ?   Mental Status: She is alert and oriented to person, place, and time.  ?Psychiatric:     ?   Mood and Affect: Mood is anxious.     ?   Behavior: Behavior is agitated.  ? ? ?ED Results / Procedures / Treatments   ?Labs ?(all labs ordered are listed, but only abnormal results are displayed) ?Labs Reviewed  ?COMPREHENSIVE METABOLIC PANEL - Abnormal; Notable for the following components:  ?    Result Value  ? Glucose, Bld 112 (*)   ? All other components within normal limits  ?SALICYLATE LEVEL - Abnormal; Notable for the following components:  ? Salicylate Lvl <6.5 (*)   ? All other components within normal limits  ?ACETAMINOPHEN LEVEL - Abnormal; Notable for the following components:  ? Acetaminophen (Tylenol), Serum <10 (*)   ? All other components within normal limits  ?RAPID URINE DRUG SCREEN, HOSP PERFORMED - Abnormal; Notable for the following components:  ? Amphetamines POSITIVE (*)   ? All other components within normal limits  ?CBC WITH DIFFERENTIAL/PLATELET - Abnormal; Notable for the following components:  ? WBC 11.0 (*)   ? All other components within normal  limits  ?URINALYSIS, ROUTINE W REFLEX MICROSCOPIC - Abnormal; Notable for the following components:  ? APPearance CLOUDY (*)   ? Leukocytes,Ua MODERATE (*)   ? Bacteria, UA FEW (*)   ? Non Squamous Epithelial 0-5 (*)   ? All other components within normal limits  ?ETHANOL  ? ? ?EKG ?None ? ?Radiology ?No results found. ? ?Procedures ?Procedures  ? ? ?Medications Ordered in ED ?Medications - No data to display ? ?  ED Course/ Medical Decision Making/ A&P ?  ?                        ?Medical Decision Making ?Amount and/or Complexity of Data Reviewed ?Labs: ordered. ? ? ?This patient presents to the ED for concern of rash, this involves an extensive number of treatment options, and is a complaint that carries with it a high risk of complications and morbidity.  The differential diagnosis includes parasitic infection ? ? ?Co morbidities that complicate the patient evaluation ? ?anxiety ? ? ?Additional history obtained: ? ?Additional history obtained from epic chart review ? ? ?Lab Tests: ? ?I Ordered, and personally interpreted labs.  The pertinent results include:  cbc nl (hgb 14.6); cmp neg; ua neg; uds neg. ? ? ? ?Problem List / ED Course: ? ?Rash:  she has had skin biopsies and O&P studies.  Doubt parasitic infection.  Pt keeps showing me her hands saying that she has worms under her skin.  She has taken several pictures of these worms, but I don't see anything.  Pt continues to pick at her skin.  UDS + for amphetamines.  Abilify can cause a false +, so she may be taking it some.  She denies drug use. ? ? ?Reevaluation: ? ?After the interventions noted above, I reevaluated the patient and found that they have :stayed the same ? ? ?Social Determinants of Health: ? ?Lives at home. ? ? ?Dispostion: ? ?After consideration of the diagnostic results and the patients response to treatment, I feel that the patent would benefit from discharge with outpatient f/u.   ? ? ? ? ? ? ? ?Final Clinical Impression(s) / ED  Diagnoses ?Final diagnoses:  ?Chronic lichen planus  ?Excoriation (skin-picking) disorder  ? ? ?Rx / DC Orders ?ED Discharge Orders   ? ? None  ? ?  ? ? ?  ?Isla Pence, MD ?02/21/22 1315 ? ?

## 2022-02-21 NOTE — Discharge Instructions (Addendum)
Follow up with dermatology.  You should consider starting the Abilify as your doctor recommended.  Consider seeing a counselor. ?

## 2022-02-21 NOTE — ED Notes (Signed)
Patient reporting she is not leaving until she gets help for the rash, reports mD was rude and didn't look at her rash.   ?

## 2022-02-21 NOTE — ED Notes (Signed)
Patient angry stating she is coughing up worms and they are coming out her nose. Also tried to show this RN worms in heel of foot which was not visible just dry cracking skin and excoriation from where she has been scratching so much.  Patient states "im going to get an attorney" ?

## 2022-02-21 NOTE — ED Triage Notes (Addendum)
Patient here with complaint of multiple lesions over her body that first appeared in November 2022. Patient initially diagnosed and treated for scabies but with resolution. 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 Patient is alert, oriented, speaking in complete sentences and is very anxious. Patient states she was prescribed an antipsychotic medication and referred to a psychiatrist but refuses to take those medications and has not been to psychiatrist. Photos that patient has sent to her provider of what she believes are parasites are in epic under "Media". ?

## 2022-02-23 ENCOUNTER — Encounter: Payer: Self-pay | Admitting: Family Medicine

## 2022-02-23 DIAGNOSIS — R21 Rash and other nonspecific skin eruption: Secondary | ICD-10-CM

## 2022-02-24 ENCOUNTER — Encounter: Payer: Self-pay | Admitting: Family Medicine

## 2022-02-25 ENCOUNTER — Encounter: Payer: Self-pay | Admitting: Family Medicine

## 2022-02-26 ENCOUNTER — Encounter: Payer: Self-pay | Admitting: Family Medicine

## 2022-03-13 ENCOUNTER — Other Ambulatory Visit: Payer: Self-pay

## 2022-03-13 ENCOUNTER — Encounter: Payer: Self-pay | Admitting: Internal Medicine

## 2022-03-13 ENCOUNTER — Ambulatory Visit (INDEPENDENT_AMBULATORY_CARE_PROVIDER_SITE_OTHER): Payer: Self-pay | Admitting: Internal Medicine

## 2022-03-13 DIAGNOSIS — L439 Lichen planus, unspecified: Secondary | ICD-10-CM

## 2022-03-13 NOTE — Progress Notes (Signed)
?  [facility] Infectious [facility]  ? ? ? ? ?Reason for Consult: concern for parasitic infection     ?Referring Physician: Dr. Elease Hashimoto ? ? ? Patient ID: [name], female    DOB: Aug 20, 1966, 56 y.o.   MRN: [id number] ? ?HPI:   ?She is here for evaluation for concern for a parasitic infection in her skin.  She gives a history of about 9 months of symptoms that mainly started as an itch then developed rashes and swelling of her legs.  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  Dr. Elease Hashimoto did a skin biopsy and was consistent with lichen planus. Stool O and P also done and negative for parasites.  She has been treated with permetherin and ivermectin and has cleaned her environment for concern for scabies. She has multiple pictures.  Her children travel out of the country some.  She has noted more body hair recently and had a significantly decreased Hgb in January requiring a transfusion.   ? ?Past Medical History:  ?Diagnosis Date  ? Asthma   ? Blood transfusion   ? Depression   ? GERD (gastroesophageal reflux disease)   ? Hepatitis A   ? Hypertension   ? Jaundice   ? Migraines   ? Ovarian cyst   ? UTI (urinary tract infection)   ? ? ?Prior to Admission medications   ?Medication Sig Start Date End Date Taking? Authorizing Provider  ?albuterol (PROVENTIL HFA;VENTOLIN HFA) 108 (90 Base) MCG/ACT inhaler Inhale 2 puffs into the lungs every 6 (six) hours as needed for wheezing or shortness of breath. 01/14/19  Yes Isaac Bliss, Rayford Halsted, MD  ?amLODipine (NORVASC) 10 MG tablet Take 1 tablet (10 mg total) by mouth daily. 12/28/21  Yes Burchette, Alinda Sierras, MD  ?Ascorbic Acid (VITAMIN C) 100 MG tablet Take 100 mg by mouth daily.   Yes [provider]  ?escitalopram (LEXAPRO) 10 MG tablet Take 1 tablet (10 mg total) by mouth daily. 12/28/21  Yes  Burchette, Alinda Sierras, MD  ?esomeprazole (NEXIUM) 40 MG capsule Take 40 mg by mouth daily at 12 noon.   Yes [provider]  ?Ferrous Sulfate (IRON PO) Take by mouth.   Yes [provider]  ?losartan (COZAAR) 100 MG tablet Take 1 tablet (100 mg total) by mouth daily. 01/07/22  Yes Burchette, Alinda Sierras, MD  ?triamcinolone cream (KENALOG) 0.1 % Apply 1 application. topically 2 (two) times daily. 01/21/22  Yes Burchette, Alinda Sierras, MD  ?albendazole (ALBENZA) 200 MG tablet Take two tablets by mouth one dose ?Patient not taking: Reported on 02/21/2022 01/14/22   [name], MD  ?amphetamine-dextroamphetamine (ADDERALL XR) 25 MG 24 hr capsule Take 25 mg by mouth 2 (two) times daily as needed (work). ?Patient not taking: Reported on 03/13/2022 02/13/22   [provider]  ?ARIPiprazole (ABILIFY) 2 MG tablet Take 1 tablet (2 mg total) by mouth daily. ?Patient not taking: Reported on 02/21/2022 01/14/22   [name], MD  ?diphenhydrAMINE (BENADRYL) 25 MG tablet Take 50 mg by mouth every 6 (six) hours as needed for itching.    [provider]  ?ferrous sulfate 325 (65 FE) MG tablet Take 1 tablet (325 mg total) by mouth 2 (two) times daily with a meal. 11/06/21   Shelly Coss, MD  ?hydrOXYzine (ATARAX) 25 MG tablet  Take 1 tablet (25 mg total) by mouth every 4 (four) hours as needed for itching. ?Patient not taking: Reported on 02/21/2022 11/06/21   Shelly Coss, MD  ?Na Sulfate-K Sulfate-Mg Sulf (SUPREP BOWEL PREP KIT) 17.5-3.13-1.6 GM/177ML SOLN Take 1 kit by mouth as directed. For colonoscopy prep ?Patient not taking: Reported on 02/21/2022 01/02/22   Willia Craze, NP  ?losartan-hydrochlorothiazide (HYZAAR) 100-12.5 MG tablet TAKE 1 TABLET BY MOUTH ONCE DAILY ?Patient not taking: Reported on 01/18/2021 02/26/19 03/11/21  [name], MD  ? ? ?Allergies  ?Allergen Reactions  ? Aspirin Shortness Of Breath  ?   wheezing problems  ? Codeine Sulfate Hives, Itching, Nausea And Vomiting and  Rash  ? Hydrocodone-Acetaminophen Itching and Rash  ? ? ?Social History  ? ?Tobacco Use  ? Smoking status: Never  ? Smokeless tobacco: Never  ?Vaping Use  ? Vaping Use: Never used  ?Substance Use Topics  ? Alcohol use: Not Currently  ?  Comment: several times monthly  ? Drug use: No  ? ? ?Family History  ?Problem Relation Age of Onset  ? Asthma Son   ? Hypertension Other   ? Crohn's disease Father   ? Crohn's disease Maternal Aunt   ? Esophageal cancer Maternal Uncle   ? Breast cancer Maternal Grandmother   ? ? ? ?Review of Systems ? Constitutional: negative for malaise, anorexia, and weight loss ?Gastrointestinal: negative for diarrhea ?Integument/breast: positive for rash, skin lesion(s), and pruritus ?All other systems reviewed and are negative   ? ?Constitutional: in no apparent distress  ?Vitals:  ? 03/13/22 0840  ?BP: (!) 168/94  ?Pulse: 74  ?Resp: 16  ?Temp: 98 ?F (36.7 ?C)  ?SpO2: 97%  ? ?EYES: anicteric ?Cardiovascular: Cor RRR ?Respiratory: normal respiratory effort ?Musculoskeletal: no edema ?Skin: + several areas of 1-2 mm red rash on arms, signs of scratching ?Psych: anxious, appropriate, pleasant ? ?Labs: ?Lab Results  ?Component Value Date  ? WBC 11.0 (H) 02/21/2022  ? HGB 14.6 02/21/2022  ? HCT 44.4 02/21/2022  ? MCV 88.3 02/21/2022  ? PLT 394 02/21/2022  ?  ?Lab Results  ?Component Value Date  ? CREATININE 0.98 02/21/2022  ? BUN 17 02/21/2022  ? NA 137 02/21/2022  ? K 4.0 02/21/2022  ? CL 104 02/21/2022  ? CO2 24 02/21/2022  ?  ?Lab Results  ?Component Value Date  ? ALT 32 02/21/2022  ? AST 25 02/21/2022  ? ALKPHOS 90 02/21/2022  ? BILITOT 0.6 02/21/2022  ?  ? ?Assessment: lichen planus - I thoroughly discussed the history, reviewed all pictures she provided on her phone and gave her my impression. In review of the pictures, I confirmed that there is no evidence of parasites on the pictures reviewed and I do not see any evidence of a parasitic infection and do feel her symptoms and history fit that  of a skin disorder, likely as a result of the scratching.  I discussed the biopsy results and lichen planus and treatment.  She voiced her understanding including the understanding that she does not have a parasitic infection.   ? ?Plan: ?1)  try topical steroids for the skin lesions/itch ?2) though the biopsy results may just be from the constant scratching, consider a hepatitis C Ab test next time she gets labs done due to the association of lichen planus with hepatitis C.  ? ?Follow up as needed.  ?

## 2022-03-13 NOTE — Patient Instructions (Signed)
Try hydrocortisone topical cream on affected areas.  You can get this over the counter.  ?

## 2022-05-01 ENCOUNTER — Other Ambulatory Visit: Payer: Self-pay | Admitting: Family Medicine

## 2022-06-19 ENCOUNTER — Ambulatory Visit (INDEPENDENT_AMBULATORY_CARE_PROVIDER_SITE_OTHER): Payer: Self-pay | Admitting: Family Medicine

## 2022-06-19 ENCOUNTER — Encounter: Payer: Self-pay | Admitting: Family Medicine

## 2022-06-19 VITALS — BP 160/84 | HR 105 | Temp 97.4°F | Ht 66.0 in | Wt 203.0 lb

## 2022-06-19 DIAGNOSIS — G4700 Insomnia, unspecified: Secondary | ICD-10-CM

## 2022-06-19 DIAGNOSIS — F439 Reaction to severe stress, unspecified: Secondary | ICD-10-CM

## 2022-06-19 MED ORDER — ALPRAZOLAM 0.5 MG PO TABS: 0.5000 mg | ORAL_TABLET | Freq: Three times a day (TID) | ORAL | 0 refills | Status: DC | PRN
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
## 2022-09-02 ENCOUNTER — Telehealth: Payer: Self-pay | Admitting: Family Medicine

## 2022-09-02 DIAGNOSIS — F411 Generalized anxiety disorder: Secondary | ICD-10-CM

## 2022-09-02 DIAGNOSIS — F439 Reaction to severe stress, unspecified: Secondary | ICD-10-CM

## 2022-09-02 MED ORDER — ESCITALOPRAM OXALATE 10 MG PO TABS: ORAL_TABLET | ORAL | 0 refills | Status: DC

## 2022-09-02 NOTE — Telephone Encounter (Signed)
Pt currently has a refill on file at [facility] for requested medication.     Pt has appointment scheduled for 09/03/22.   Okay to send refill today or at f/u appt on 09/03/22?   Please advise.

## 2022-09-02 NOTE — Telephone Encounter (Signed)
Called patient mailbox full,  refill for Esciitalopram 10 mg sent to [facility] on [address].

## 2022-09-02 NOTE — Telephone Encounter (Signed)
Pt has an appt tomorrow and would like a refill on escitalopram (LEXAPRO) 10 MG tablet please sent to  [facility] [id number], [place] Phone:  [phone]  Fax:  [phone]

## 2022-09-03 ENCOUNTER — Ambulatory Visit: Payer: Self-pay | Admitting: Family Medicine

## 2022-10-15 ENCOUNTER — Other Ambulatory Visit: Payer: Self-pay | Admitting: Family Medicine

## 2022-10-15 DIAGNOSIS — F411 Generalized anxiety disorder: Secondary | ICD-10-CM

## 2023-01-10 ENCOUNTER — Other Ambulatory Visit: Payer: Self-pay | Admitting: Family Medicine

## 2023-01-10 DIAGNOSIS — F411 Generalized anxiety disorder: Secondary | ICD-10-CM

## 2023-01-10 NOTE — Telephone Encounter (Signed)
Patient is requesting this be increased to '20mg'$    [facility] [id number] - [address], [place] Phone: [phone]  Fax: [phone]

## 2023-03-27 ENCOUNTER — Other Ambulatory Visit: Payer: Self-pay | Admitting: Family Medicine

## 2023-03-27 DIAGNOSIS — F411 Generalized anxiety disorder: Secondary | ICD-10-CM

## 2023-04-01 ENCOUNTER — Ambulatory Visit: Payer: Self-pay | Admitting: Family Medicine

## 2023-06-28 ENCOUNTER — Other Ambulatory Visit: Payer: Self-pay | Admitting: Family Medicine

## 2023-06-28 DIAGNOSIS — F411 Generalized anxiety disorder: Secondary | ICD-10-CM

## 2023-07-27 ENCOUNTER — Other Ambulatory Visit: Payer: Self-pay | Admitting: Family Medicine

## 2023-07-27 DIAGNOSIS — F411 Generalized anxiety disorder: Secondary | ICD-10-CM

## 2023-08-08 ENCOUNTER — Other Ambulatory Visit: Payer: Self-pay | Admitting: Family Medicine

## 2023-08-08 DIAGNOSIS — Z1212 Encounter for screening for malignant neoplasm of rectum: Secondary | ICD-10-CM

## 2023-08-08 DIAGNOSIS — Z1211 Encounter for screening for malignant neoplasm of colon: Secondary | ICD-10-CM

## 2023-08-23 ENCOUNTER — Other Ambulatory Visit: Payer: Self-pay | Admitting: Family Medicine

## 2023-08-23 DIAGNOSIS — F411 Generalized anxiety disorder: Secondary | ICD-10-CM

## 2023-09-20 ENCOUNTER — Other Ambulatory Visit: Payer: Self-pay | Admitting: Family Medicine

## 2023-09-20 DIAGNOSIS — F411 Generalized anxiety disorder: Secondary | ICD-10-CM

## 2023-10-17 ENCOUNTER — Other Ambulatory Visit: Payer: Self-pay | Admitting: Family Medicine

## 2023-10-17 DIAGNOSIS — F411 Generalized anxiety disorder: Secondary | ICD-10-CM

## 2023-11-13 ENCOUNTER — Other Ambulatory Visit: Payer: Self-pay | Admitting: Family Medicine

## 2023-11-13 DIAGNOSIS — F411 Generalized anxiety disorder: Secondary | ICD-10-CM

## 2023-12-09 ENCOUNTER — Other Ambulatory Visit: Payer: Self-pay | Admitting: Family Medicine

## 2023-12-09 DIAGNOSIS — F411 Generalized anxiety disorder: Secondary | ICD-10-CM

## 2023-12-11 NOTE — Telephone Encounter (Signed)
 Left a message for the patient to return my call. Rx sent

## 2024-01-08 ENCOUNTER — Other Ambulatory Visit: Payer: Self-pay | Admitting: Family Medicine

## 2024-01-08 DIAGNOSIS — F411 Generalized anxiety disorder: Secondary | ICD-10-CM

## 2024-01-12 ENCOUNTER — Ambulatory Visit: Payer: Managed Care, Other (non HMO) | Admitting: Family Medicine

## 2024-04-18 ENCOUNTER — Other Ambulatory Visit: Payer: Self-pay

## 2024-04-18 ENCOUNTER — Emergency Department (HOSPITAL_COMMUNITY)
Admission: EM | Admit: 2024-04-18 | Discharge: 2024-04-19 | Disposition: A | Discharge status: Home / Self Care | Payer: Self-pay | Attending: Emergency Medicine | Admitting: Emergency Medicine

## 2024-04-18 ENCOUNTER — Encounter (HOSPITAL_COMMUNITY): Payer: Self-pay

## 2024-04-18 ENCOUNTER — Emergency Department (HOSPITAL_COMMUNITY): Payer: Self-pay

## 2024-04-18 DIAGNOSIS — R739 Hyperglycemia, unspecified: Secondary | ICD-10-CM

## 2024-04-18 DIAGNOSIS — J45901 Unspecified asthma with (acute) exacerbation: Secondary | ICD-10-CM | POA: Insufficient documentation

## 2024-04-18 DIAGNOSIS — I10 Essential (primary) hypertension: Secondary | ICD-10-CM | POA: Insufficient documentation

## 2024-04-18 DIAGNOSIS — Z79899 Other long term (current) drug therapy: Secondary | ICD-10-CM | POA: Insufficient documentation

## 2024-04-18 DIAGNOSIS — D649 Anemia, unspecified: Secondary | ICD-10-CM | POA: Insufficient documentation

## 2024-04-18 DIAGNOSIS — R7309 Other abnormal glucose: Secondary | ICD-10-CM | POA: Insufficient documentation

## 2024-04-18 LAB — CBC WITH DIFFERENTIAL/PLATELET
Abs Immature Granulocytes: 0.1 10*3/uL — ABNORMAL HIGH (ref 0.00–0.07)
Basophils Absolute: 0.1 10*3/uL (ref 0.0–0.1)
Basophils Relative: 1 %
Eosinophils Absolute: 0.1 10*3/uL (ref 0.0–0.5)
Eosinophils Relative: 1 %
HCT: 39.1 % (ref 36.0–46.0)
Hemoglobin: 11.9 g/dL — ABNORMAL LOW (ref 12.0–15.0)
Immature Granulocytes: 1 %
Lymphocytes Relative: 19 %
Lymphs Abs: 3.3 10*3/uL (ref 0.7–4.0)
MCH: 24.7 pg — ABNORMAL LOW (ref 26.0–34.0)
MCHC: 30.4 g/dL (ref 30.0–36.0)
MCV: 81.3 fL (ref 80.0–100.0)
Monocytes Absolute: 0.4 10*3/uL (ref 0.1–1.0)
Monocytes Relative: 3 %
Neutro Abs: 13.5 10*3/uL — ABNORMAL HIGH (ref 1.7–7.7)
Neutrophils Relative %: 75 %
Platelets: 385 10*3/uL (ref 150–400)
RBC: 4.81 MIL/uL (ref 3.87–5.11)
RDW: 14.4 % (ref 11.5–15.5)
WBC: 17.5 10*3/uL — ABNORMAL HIGH (ref 4.0–10.5)
nRBC: 0 % (ref 0.0–0.2)

## 2024-04-18 LAB — BASIC METABOLIC PANEL WITH GFR
Anion gap: 12 (ref 5–15)
BUN: 17 mg/dL (ref 6–20)
CO2: 20 mmol/L — ABNORMAL LOW (ref 22–32)
Calcium: 8.8 mg/dL — ABNORMAL LOW (ref 8.9–10.3)
Chloride: 107 mmol/L (ref 98–111)
Creatinine, Ser: 0.9 mg/dL (ref 0.44–1.00)
GFR, Estimated: >60 mL/min (ref >60)
Glucose, Bld: 121 mg/dL — ABNORMAL HIGH (ref 70–99)
Potassium: 3.4 mmol/L — ABNORMAL LOW (ref 3.5–5.1)
Sodium: 139 mmol/L (ref 135–145)

## 2024-04-18 MED ORDER — ALBUTEROL SULFATE (2.5 MG/3ML) 0.083% IN NEBU
10.0000 mg/h | INHALATION_SOLUTION | Freq: Once | RESPIRATORY_TRACT | Status: AC
  Administered 2024-04-18: 10 mg/h via RESPIRATORY_TRACT
  Filled 2024-04-18: qty 3

## 2024-04-18 MED ORDER — IPRATROPIUM-ALBUTEROL 0.5-2.5 (3) MG/3ML IN SOLN
3.0000 mL | Freq: Once | RESPIRATORY_TRACT | Status: AC
  Administered 2024-04-19: 3 mL via RESPIRATORY_TRACT
  Filled 2024-04-18: qty 3

## 2024-04-18 MED ORDER — MAGNESIUM SULFATE 2 GM/50ML IV SOLN
2.0000 g | Freq: Once | INTRAVENOUS | Status: AC
  Administered 2024-04-18: 2 g via INTRAVENOUS
  Filled 2024-04-18: qty 50

## 2024-04-18 MED ORDER — LORAZEPAM 1 MG PO TABS
1.0000 mg | ORAL_TABLET | Freq: Once | ORAL | Status: AC
  Administered 2024-04-19: 1 mg via ORAL
  Filled 2024-04-18: qty 1

## 2024-04-18 NOTE — ED Triage Notes (Signed)
 Pt arrives EMS from home with reports of asthma exacerbation. Pt reports she was walking her dog tonight and came inside and became [facility] and had increased work of breathing. Pt has been out of all prescription medications and has had a couple asthma flare ups this weekend. Pt resp unlabored at rest. Pt received solumedrol and x2 duonebs with EMS.

## 2024-04-18 NOTE — ED Provider Notes (Signed)
 Care assumed from Dr. Leighton Punches, patient with asthma exacerbation. Labs and chest x-ray are pending, will need to be reassessed.  23:50 Her breathing is significantly improved after nebulizer treatment but not quite back to normal.  On exam, there is still a prolonged exhalation phase.  She is still hypertensive.  Chest x-ray shows no acute process.  I have independently viewed the image, and agree with the radiologist's interpretation.  I reviewed her laboratory test, and my interpretation is leukocytosis which is nonspecific, mild hypokalemia likely secondary to albuterol  use, mild anemia.  She is complaining of feeling very anxious.  I have ordered additional albuterol  with ipratropium and also lorazepam .  01:10 She feels much better following above-noted treatment.  Blood pressure is still significantly elevated.  I had a discussion with the patient about her blood pressure and she admits she has not been taking her medication for the last 6 months.  She relates a lot of difficulty with illness and death in family members and she has been very depressed.  She has made arrangements to see a Veterinary surgeon.  She has not seen her primary care provider, I am encouraging her to make a follow-up appointment with her primary care provider.  I am ordering a dose of her amlodipine  and losartan  and I am giving her prescriptions for both as well as a prescription for a 5-day course of prednisone .  I am giving her an albuterol  inhaler to use at home as needed.  Results for orders placed or performed during the hospital encounter of 04/18/24  CBC with Differential/Platelet   Collection Time: 04/18/24 11:11 PM  Result Value Ref Range   WBC 17.5 (H) 4.0 - 10.5 K/uL   RBC 4.81 3.87 - 5.11 MIL/uL   Hemoglobin 11.9 (L) 12.0 - 15.0 g/dL   HCT 40.9 81.1 - 91.4 %   MCV 81.3 80.0 - 100.0 fL   MCH 24.7 (L) 26.0 - 34.0 pg   MCHC 30.4 30.0 - 36.0 g/dL   RDW 78.2 95.6 - 21.3 %   Platelets 385 150 - 400 K/uL   nRBC 0.0 0.0 -  0.2 %   Neutrophils Relative % 75 %   Neutro Abs 13.5 (H) 1.7 - 7.7 K/uL   Lymphocytes Relative 19 %   Lymphs Abs 3.3 0.7 - 4.0 K/uL   Monocytes Relative 3 %   Monocytes Absolute 0.4 0.1 - 1.0 K/uL   Eosinophils Relative 1 %   Eosinophils Absolute 0.1 0.0 - 0.5 K/uL   Basophils Relative 1 %   Basophils Absolute 0.1 0.0 - 0.1 K/uL   Immature Granulocytes 1 %   Abs Immature Granulocytes 0.10 (H) 0.00 - 0.07 K/uL  Basic metabolic panel with GFR   Collection Time: 04/18/24 11:11 PM  Result Value Ref Range   Sodium 139 135 - 145 mmol/L   Potassium 3.4 (L) 3.5 - 5.1 mmol/L   Chloride 107 98 - 111 mmol/L   CO2 20 (L) 22 - 32 mmol/L   Glucose, Bld 121 (H) 70 - 99 mg/dL   BUN 17 6 - 20 mg/dL   Creatinine, Ser 0.86 0.44 - 1.00 mg/dL   Calcium 8.8 (L) 8.9 - 10.3 mg/dL   GFR, Estimated >57 >84 mL/min   Anion gap 12 5 - 15   DG Chest Port 1 View Result Date: 04/18/2024 EXAM: 1 VIEW XRAY OF THE CHEST 04/18/2024 11:20:11 PM COMPARISON: 01/14/2019 CLINICAL HISTORY: SOB. FINDINGS: LUNGS AND PLEURA: No focal pulmonary opacity. No pulmonary edema. No pleural  effusion. No pneumothorax. HEART AND MEDIASTINUM: No acute abnormality of the cardiac and mediastinal silhouettes. BONES AND SOFT TISSUES: No acute osseous abnormality. IMPRESSION: 1. No acute process. Electronically signed by: Zadie Herter MD 04/18/2024 11:26 PM EDT RP Workstation: [id number]  ]    Alissa April, MD 04/19/24 0120

## 2024-04-18 NOTE — ED Provider Notes (Signed)
 [place] EMERGENCY DEPARTMENT AT [facility] Provider Note   CSN: [id number] Arrival date & time: 04/18/24  2158     Patient presents with: No chief complaint on file.   [name] is a 58 y.o. female.   58 year old female who presents with shortness of breath.  Has history of asthma and states that she has been out of her normal albuterol .  Has been using Primatene Mist.  States that tonight she was walking her dog outside and got short of breath.  Notes that she was seen by EMS 2 days ago due to asthma exacerbation.  She was given Solu-Medrol , magnesium , albuterol  x 2.  Was encouraged to come to the hospital but she deferred.  Unfortunately symptoms reappeared again tonight.  Called EMS and was given albuterol  along with Solu-Medrol  and transported here.  Notes that her cough has been nonproductive and feels that this is allergy related.  Denies any fevers       Prior to Admission medications   Medication Sig Start Date End Date Taking? Authorizing Provider  albuterol  (PROVENTIL  HFA;VENTOLIN  HFA) 108 (90 Base) MCG/ACT inhaler Inhale 2 puffs into the lungs every 6 (six) hours as needed for wheezing or shortness of breath. 01/14/19   Zilphia Hilt, Charyl Coppersmith, MD  ALPRAZolam  (XANAX ) 0.5 MG tablet Take 1 tablet (0.5 mg total) by mouth 3 (three) times daily as needed for anxiety. 06/19/22   Burchette, Marijean Shouts, MD  amLODipine  (NORVASC ) 10 MG tablet Take 1 tablet (10 mg total) by mouth daily. 12/28/21   Burchette, Marijean Shouts, MD  amphetamine -dextroamphetamine  (ADDERALL XR) 25 MG 24 hr capsule Take 25 mg by mouth 2 (two) times daily as needed (work). 02/13/22   [provider]  Ascorbic Acid (VITAMIN C) 100 MG tablet Take 100 mg by mouth daily.    [provider]  escitalopram  (LEXAPRO ) 10 MG tablet TAKE 1 TABLET BY MOUTH DAILY **MUST CALL MD FOR APPOINTMENT 01/08/24   Burchette, Marijean Shouts, MD  esomeprazole (NEXIUM) 40 MG capsule Take 40 mg by mouth daily at 12  noon.    [provider]  Ferrous Sulfate  (IRON PO) Take by mouth.    [provider]  ferrous sulfate  325 (65 FE) MG tablet Take 1 tablet (325 mg total) by mouth 2 (two) times daily with a meal. 11/06/21   Leona Rake, MD  losartan  (COZAAR ) 100 MG tablet Take 1 tablet (100 mg total) by mouth daily. 01/07/22   Burchette, Marijean Shouts, MD  triamcinolone  cream (KENALOG ) 0.1 % Apply 1 application. topically 2 (two) times daily. 01/21/22   Burchette, Marijean Shouts, MD  losartan -hydrochlorothiazide  (HYZAAR) 100-12.5 MG tablet TAKE 1 TABLET BY MOUTH ONCE DAILY Patient not taking: Reported on 01/18/2021 02/26/19 03/11/21  Marquetta Sit, MD    Allergies: Aspirin, Codeine sulfate, and Hydrocodone -acetaminophen     Review of Systems  All other systems reviewed and are negative.   Updated Vital Signs BP (!) 204/124   Pulse 95   Temp 98.1 F (36.7 C) (Oral)   Resp 20   Ht 1.676 m (5' 6)   Wt 81.6 kg   SpO2 96%   BMI 29.05 kg/m   Physical Exam Vitals and nursing note reviewed.  Constitutional:      General: She is not in acute distress.    Appearance: Normal appearance. She is well-developed. She is not toxic-appearing.  HENT:     Head: Normocephalic and atraumatic.   Eyes:     General: Lids are  normal.     Conjunctiva/sclera: Conjunctivae normal.     Pupils: Pupils are equal, round, and reactive to light.   Neck:     Thyroid : No thyroid  mass.     Trachea: No tracheal deviation.   Cardiovascular:     Rate and Rhythm: Normal rate and regular rhythm.     Heart sounds: Normal heart sounds. No murmur heard.    No gallop.  Pulmonary:     Effort: Prolonged expiration and respiratory distress present.     Breath sounds: Normal breath sounds. No stridor. No decreased breath sounds, wheezing, rhonchi or rales.  Abdominal:     General: There is no distension.     Palpations: Abdomen is soft.     Tenderness: There is no abdominal tenderness. There is no rebound.    Musculoskeletal:        General: No tenderness. Normal range of motion.     Cervical back: Normal range of motion and neck supple.   Skin:    General: Skin is warm and dry.     Findings: No abrasion or rash.   Neurological:     Mental Status: She is alert and oriented to person, place, and time. Mental status is at baseline.     GCS: GCS eye subscore is 4. GCS verbal subscore is 5. GCS motor subscore is 6.     Cranial Nerves: No cranial nerve deficit.     Sensory: No sensory deficit.     Motor: Motor function is intact.   Psychiatric:        Attention and Perception: Attention normal.        Speech: Speech normal.        Behavior: Behavior normal.     (all labs ordered are listed, but only abnormal results are displayed) Labs Reviewed  CBC WITH DIFFERENTIAL/PLATELET  BASIC METABOLIC PANEL WITH GFR    EKG: None  Radiology: No results found.   Procedures   Medications Ordered in the ED  albuterol  (PROVENTIL ,VENTOLIN ) solution continuous neb (has no administration in time range)                                    Medical Decision Making Amount and/or Complexity of Data Reviewed Labs: ordered. Radiology: ordered.  Risk Prescription drug management.   Patient here with asthma exacerbation.  Will be given magnesium  and butyryl continuous nebulizer here.  Will check chest x-ray and blood work.Dispo per dr Candelaria Chaco     Final diagnoses:  None    ED Discharge Orders     None          Lind Repine, MD 04/18/24 2248

## 2024-04-19 MED ORDER — LOSARTAN POTASSIUM 100 MG PO TABS
100.0000 mg | ORAL_TABLET | Freq: Every day | ORAL | 3 refills | Status: DC
Start: 2024-04-19 — End: 2024-07-21

## 2024-04-19 MED ORDER — PREDNISONE 20 MG PO TABS: 60.0000 mg | ORAL_TABLET | Freq: Every day | ORAL | 0 refills | Status: DC

## 2024-04-19 MED ORDER — AMLODIPINE BESYLATE 5 MG PO TABS
10.0000 mg | ORAL_TABLET | Freq: Once | ORAL | Status: AC
  Administered 2024-04-19: 10 mg via ORAL
  Filled 2024-04-19: qty 2

## 2024-04-19 MED ORDER — LOSARTAN POTASSIUM 50 MG PO TABS
100.0000 mg | ORAL_TABLET | Freq: Once | ORAL | Status: AC
  Administered 2024-04-19: 100 mg via ORAL
  Filled 2024-04-19: qty 2

## 2024-04-19 MED ORDER — AMLODIPINE BESYLATE 10 MG PO TABS: 10.0000 mg | ORAL_TABLET | Freq: Every day | ORAL | 3 refills | Status: DC

## 2024-04-19 MED ORDER — ALBUTEROL SULFATE HFA 108 (90 BASE) MCG/ACT IN AERS
2.0000 | INHALATION_SPRAY | RESPIRATORY_TRACT | Status: DC | PRN
  Filled 2024-04-19: qty 6.7

## 2024-04-19 NOTE — Discharge Instructions (Addendum)
 It is very important that you take your blood pressure medication every day.  Please check your blood pressure once a day and keep a record of it.  That should be something that you do all the time, not just after an emergency department visit.  Take that record with you when you see your primary care provider.  Your blood pressure readings between visits will help your primary care provider decide if you need to have your medication adjusted.

## 2024-05-31 ENCOUNTER — Emergency Department (HOSPITAL_COMMUNITY): Payer: Self-pay

## 2024-05-31 ENCOUNTER — Emergency Department (HOSPITAL_COMMUNITY)
Admission: EM | Admit: 2024-05-31 | Discharge: 2024-05-31 | Disposition: A | Discharge status: Home / Self Care | Payer: Self-pay | Attending: Emergency Medicine | Admitting: Emergency Medicine

## 2024-05-31 ENCOUNTER — Encounter (HOSPITAL_COMMUNITY): Payer: Self-pay

## 2024-05-31 ENCOUNTER — Other Ambulatory Visit: Payer: Self-pay

## 2024-05-31 DIAGNOSIS — J4541 Moderate persistent asthma with (acute) exacerbation: Secondary | ICD-10-CM | POA: Insufficient documentation

## 2024-05-31 DIAGNOSIS — D72829 Elevated white blood cell count, unspecified: Secondary | ICD-10-CM | POA: Insufficient documentation

## 2024-05-31 LAB — CBC
HCT: 40.1 % (ref 36.0–46.0)
Hemoglobin: 12.8 g/dL (ref 12.0–15.0)
MCH: 24.7 pg — ABNORMAL LOW (ref 26.0–34.0)
MCHC: 31.9 g/dL (ref 30.0–36.0)
MCV: 77.3 fL — ABNORMAL LOW (ref 80.0–100.0)
Platelets: 322 K/uL (ref 150–400)
RBC: 5.19 MIL/uL — ABNORMAL HIGH (ref 3.87–5.11)
RDW: 15.6 % — ABNORMAL HIGH (ref 11.5–15.5)
WBC: 13.7 K/uL — ABNORMAL HIGH (ref 4.0–10.5)
nRBC: 0 % (ref 0.0–0.2)

## 2024-05-31 LAB — COMPREHENSIVE METABOLIC PANEL WITH GFR
ALT: 34 U/L (ref 0–44)
AST: 24 U/L (ref 15–41)
Albumin: 3.7 g/dL (ref 3.5–5.0)
Alkaline Phosphatase: 99 U/L (ref 38–126)
Anion gap: 11 (ref 5–15)
BUN: 18 mg/dL (ref 6–20)
CO2: 19 mmol/L — ABNORMAL LOW (ref 22–32)
Calcium: 9.3 mg/dL (ref 8.9–10.3)
Chloride: 107 mmol/L (ref 98–111)
Creatinine, Ser: 0.9 mg/dL (ref 0.44–1.00)
GFR, Estimated: >60 mL/min (ref >60)
Glucose, Bld: 135 mg/dL — ABNORMAL HIGH (ref 70–99)
Potassium: 3.5 mmol/L (ref 3.5–5.1)
Sodium: 137 mmol/L (ref 135–145)
Total Bilirubin: 0.6 mg/dL (ref 0.0–1.2)
Total Protein: 7.4 g/dL (ref 6.5–8.1)

## 2024-05-31 LAB — D-DIMER, QUANTITATIVE: D-Dimer, Quant: 0.6 ug{FEU}/mL — ABNORMAL HIGH (ref 0.00–0.50)

## 2024-05-31 LAB — TROPONIN I (HIGH SENSITIVITY): Troponin I (High Sensitivity): 5 ng/L (ref <18)

## 2024-05-31 MED ORDER — IPRATROPIUM-ALBUTEROL 0.5-2.5 (3) MG/3ML IN SOLN
3.0000 mL | Freq: Once | RESPIRATORY_TRACT | Status: AC
  Administered 2024-05-31: 3 mL via RESPIRATORY_TRACT
  Filled 2024-05-31: qty 3

## 2024-05-31 MED ORDER — ALBUTEROL SULFATE HFA 108 (90 BASE) MCG/ACT IN AERS: 1.0000 | INHALATION_SPRAY | Freq: Four times a day (QID) | RESPIRATORY_TRACT | 0 refills | Status: DC | PRN

## 2024-05-31 MED ORDER — METHYLPREDNISOLONE SODIUM SUCC 125 MG IJ SOLR
125.0000 mg | Freq: Once | INTRAMUSCULAR | Status: AC
  Administered 2024-05-31: 125 mg via INTRAVENOUS
  Filled 2024-05-31: qty 2

## 2024-05-31 MED ORDER — PREDNISONE 50 MG PO TABS: ORAL_TABLET | ORAL | 0 refills | Status: DC

## 2024-05-31 MED ORDER — IOHEXOL 350 MG/ML SOLN
75.0000 mL | Freq: Once | INTRAVENOUS | Status: AC | PRN
  Administered 2024-05-31: 75 mL via INTRAVENOUS

## 2024-05-31 NOTE — ED Triage Notes (Signed)
 Pt. Arrives c/o sob. States that she think her allergies triggered her asthma. Has used her inhaler but has had no relief. States that she also thinks she is having a panic attack because she is SOB.

## 2024-05-31 NOTE — ED Provider Notes (Signed)
 [place] EMERGENCY DEPARTMENT AT [facility] Provider Note   CSN: [id number] Arrival date & time: 05/31/24  9960     Patient presents with: Shortness of Breath   [name] is a 58 y.o. female.   Patient here with shortness of breath for the past couple weeks.  Believes her asthma is acting up due to allergies.  She has had postnasal drip, dry cough, runny nose.  Out of her albuterol .  Does not normally take any regular asthma medications.  Does not smoke.  Has been hospitalized for asthma remotely.  Denies chest pain.  Denies fever.  No leg pain or leg swelling.  No abdominal pain, nausea or vomiting.  No recent long car trips or plane trips.  No history of blood clots.  No fever.  The history is provided by the patient.  Shortness of Breath Associated symptoms: cough   Associated symptoms: no abdominal pain, no chest pain, no fever, no headaches, no rash and no vomiting        Prior to Admission medications   Medication Sig Start Date End Date Taking? Authorizing Provider  albuterol  (PROVENTIL  HFA;VENTOLIN  HFA) 108 (90 Base) MCG/ACT inhaler Inhale 2 puffs into the lungs every 6 (six) hours as needed for wheezing or shortness of breath. 01/14/19   Theophilus Andrews, Tully GRADE, MD  ALPRAZolam  (XANAX ) 0.5 MG tablet Take 1 tablet (0.5 mg total) by mouth 3 (three) times daily as needed for anxiety. 06/19/22   Burchette, Wolm ORN, MD  amLODipine  (NORVASC ) 10 MG tablet Take 1 tablet (10 mg total) by mouth daily. 04/19/24   Raford Lenis, MD  amphetamine -dextroamphetamine  (ADDERALL XR) 25 MG 24 hr capsule Take 25 mg by mouth 2 (two) times daily as needed (work). 02/13/22   [provider]  Ascorbic Acid (VITAMIN C) 100 MG tablet Take 100 mg by mouth daily.    [provider]  escitalopram  (LEXAPRO ) 10 MG tablet TAKE 1 TABLET BY MOUTH DAILY **MUST CALL MD FOR APPOINTMENT 01/08/24   Burchette, Wolm ORN, MD  esomeprazole (NEXIUM) 40 MG capsule Take 40 mg by mouth  daily at 12 noon.    [provider]  Ferrous Sulfate  (IRON PO) Take by mouth.    [provider]  ferrous sulfate  325 (65 FE) MG tablet Take 1 tablet (325 mg total) by mouth 2 (two) times daily with a meal. 11/06/21   Jillian Buttery, MD  losartan  (COZAAR ) 100 MG tablet Take 1 tablet (100 mg total) by mouth daily. 04/19/24   Raford Lenis, MD  predniSONE  (DELTASONE ) 20 MG tablet Take 3 tablets (60 mg total) by mouth daily. 04/19/24   Raford Lenis, MD  triamcinolone  cream (KENALOG ) 0.1 % Apply 1 application. topically 2 (two) times daily. 01/21/22   Burchette, Wolm ORN, MD  losartan -hydrochlorothiazide  (HYZAAR) 100-12.5 MG tablet TAKE 1 TABLET BY MOUTH ONCE DAILY Patient not taking: Reported on 01/18/2021 02/26/19 03/11/21  Micheal Wolm ORN, MD    Allergies: Aspirin, Codeine sulfate, and Hydrocodone -acetaminophen     Review of Systems  Constitutional:  Negative for activity change, appetite change and fever.  HENT:  Positive for congestion and rhinorrhea.   Respiratory:  Positive for cough, chest tightness and shortness of breath.   Cardiovascular:  Negative for chest pain.  Gastrointestinal:  Negative for abdominal pain, nausea and vomiting.  Genitourinary:  Negative for dysuria and hematuria.  Musculoskeletal:  Negative for arthralgias and myalgias.  Skin:  Negative for rash.  Neurological:  Negative for dizziness, weakness and  headaches.   all other systems are negative except as noted in the HPI and PMH.    Updated Vital Signs BP (!) 153/95 (BP Location: Right Arm)   Pulse (!) 106   Temp 98.3 F (36.8 C) (Oral)   Resp 20   SpO2 99%   Physical Exam Vitals and nursing note reviewed.  Constitutional:      General: She is in acute distress.     Appearance: She is well-developed.     Comments: Mild dyspnea with conversation  HENT:     Head: Normocephalic and atraumatic.     Mouth/Throat:     Pharynx: No oropharyngeal exudate.  Eyes:     Conjunctiva/sclera:  Conjunctivae normal.     Pupils: Pupils are equal, round, and reactive to light.  Neck:     Comments: No meningismus. Cardiovascular:     Rate and Rhythm: Normal rate and regular rhythm.     Heart sounds: Normal heart sounds. No murmur heard. Pulmonary:     Effort: Pulmonary effort is normal. No respiratory distress.     Breath sounds: Wheezing present.     Comments:  diminished with expiratory wheeze in bilateral Abdominal:     Palpations: Abdomen is soft.     Tenderness: There is no abdominal tenderness. There is no guarding or rebound.  Musculoskeletal:        General: No tenderness. Normal range of motion.     Cervical back: Normal range of motion and neck supple.  Skin:    General: Skin is warm.  Neurological:     Mental Status: She is alert and oriented to person, place, and time.     Cranial Nerves: No cranial nerve deficit.     Motor: No abnormal muscle tone.     Coordination: Coordination normal.     Comments:  5/5 strength throughout. CN 2-12 intact.Equal grip strength.   Psychiatric:        Behavior: Behavior normal.     (all labs ordered are listed, but only abnormal results are displayed) Labs Reviewed  CBC - Abnormal; Notable for the following components:      Result Value   WBC 13.7 (*)    RBC 5.19 (*)    MCV 77.3 (*)    MCH 24.7 (*)    RDW 15.6 (*)    All other components within normal limits  COMPREHENSIVE METABOLIC PANEL WITH GFR - Abnormal; Notable for the following components:   CO2 19 (*)    Glucose, Bld 135 (*)    All other components within normal limits  D-DIMER, QUANTITATIVE - Abnormal; Notable for the following components:   D-Dimer, Quant 0.60 (*)    All other components within normal limits  TROPONIN I (HIGH SENSITIVITY)  TROPONIN I (HIGH SENSITIVITY)    EKG: EKG Interpretation Date/Time:  Monday May 31 2024 00:56:49 EDT Ventricular Rate:  101 PR Interval:  152 QRS Duration:  110 QT Interval:  373 QTC Calculation: 484 R  Axis:   98  Text Interpretation: Sinus tachycardia Low voltage with right axis deviation Probable anteroseptal infarct, old Rate faster Confirmed by Carita Senior [phone]) on 05/31/2024 2:02:13 AM  Radiology: CT Angio Chest PE W and/or Wo Contrast Result Date: 05/31/2024 EXAM: CTA CHEST 05/31/2024 04:05:01 AM TECHNIQUE: CTA of the chest was performed after the administration of 75mL of intravenous contrast (iohexol  (OMNIPAQUE ) 350 MG/ML injection). Multiplanar reformatted images are provided for review. MIP images are provided for review. Automated exposure control, iterative reconstruction, and/or weight  based adjustment of the mA/kV was utilized to reduce the radiation dose to as low as reasonably achievable. COMPARISON: 2 view chest x-ray 05/31/2024 CLINICAL HISTORY: Pulmonary embolism (PE) suspected, low to intermediate prob, positive D-dimer. FINDINGS: PULMONARY ARTERIES: Pulmonary arteries are adequately opacified for evaluation. No acute pulmonary embolus. Main pulmonary artery is normal in caliber. MEDIASTINUM: The heart and pericardium demonstrate no acute abnormality. A large hiatal hernia is present without complication. Atherosclerotic calcifications are present in the descending thoracic and abdominal aorta without aneurysm. LUNGS AND PLEURA: The lungs are without acute process. No focal consolidation or pulmonary edema. No evidence of pleural effusion or pneumothorax. UPPER ABDOMEN: Limited images of the upper abdomen are unremarkable. SOFT TISSUES AND BONES: No acute bone or soft tissue abnormality. IMPRESSION: 1. No pulmonary embolism. 2. Large hiatal hernia without complication. Electronically signed by: Lonni Necessary MD 05/31/2024 04:27 AM EDT RP Workstation: [id number]   DG Chest 2 View Result Date: 05/31/2024 CLINICAL DATA:  Dyspnea EXAM: CHEST - 2 VIEW COMPARISON:  04/18/2024 FINDINGS: Lungs are well expanded, symmetric, and clear. No pneumothorax or pleural effusion. Cardiac  size within normal limits. Pulmonary vascularity is normal. Large hiatal hernia. Osseous structures are age-appropriate. No acute bone abnormality. IMPRESSION: 1. No active cardiopulmonary disease. 2. Large hiatal hernia. Electronically Signed   By: Dorethia Molt M.D.   On: 05/31/2024 01:23     Procedures   Medications Ordered in the ED  ipratropium-albuterol  (DUONEB) 0.5-2.5 (3) MG/3ML nebulizer solution 3 mL (has no administration in time range)  methylPREDNISolone  sodium succinate (SOLU-MEDROL ) 125 mg/2 mL injection 125 mg (has no administration in time range)                                    Medical Decision Making Amount and/or Complexity of Data Reviewed Labs: ordered. Decision-making details documented in ED Course. Radiology: ordered and independent interpretation performed. Decision-making details documented in ED Course. ECG/medicine tests: ordered and independent interpretation performed. Decision-making details documented in ED Course.  Risk Prescription drug management.   Shortness of breath times several weeks with history of asthma.  No hypoxia or increased work of breathing but she does somewhat dyspneic.  Will give bronchodilators and steroids.  EKG without acute ischemia.  Denies chest pain.  Chest x-ray in triage is negative for infiltrate or pneumothorax.  Results reviewed and interpreted by me.  Labs with leukocytosis.  Troponin negative.  D-dimer mildly positive.  After bronchodilators, patient's wheezing has improved.  She is able to ambulate without desaturation. Labs reassuring with mild leukocytosis.  Troponin negative with low concern for ACS.  D-dimer slightly elevated but CT PE scan is negative for pulmonary embolism.  Will treat for asthma exacerbation with bronchodilators and steroids.  Discussed PCP follow-up.  Return to the ED exertional chest pain, pain associate shortness of breath, nausea, vomiting, sweating or other concerns    Final  diagnoses:  None    ED Discharge Orders     None          Nicanor Mendolia, Garnette, MD 05/31/24 [phone]

## 2024-05-31 NOTE — ED Notes (Signed)
 Pt ambulated to the bathroom o2 between 94 and 96

## 2024-05-31 NOTE — Discharge Instructions (Signed)
 Use albuterol  every 4 hours as needed.  Take the steroids as prescribed.  Follow-up with your doctor.  Return to ED with new or worsening symptoms.

## 2024-06-06 ENCOUNTER — Encounter (HOSPITAL_COMMUNITY): Payer: Self-pay | Admitting: *Deleted

## 2024-06-06 ENCOUNTER — Other Ambulatory Visit: Payer: Self-pay

## 2024-06-06 ENCOUNTER — Emergency Department (HOSPITAL_COMMUNITY)
Admission: EM | Admit: 2024-06-06 | Discharge: 2024-06-06 | Disposition: A | Discharge status: Home / Self Care | Payer: Self-pay | Attending: Emergency Medicine | Admitting: Emergency Medicine

## 2024-06-06 ENCOUNTER — Emergency Department (HOSPITAL_COMMUNITY): Payer: Self-pay

## 2024-06-06 DIAGNOSIS — F410 Panic disorder [episodic paroxysmal anxiety] without agoraphobia: Secondary | ICD-10-CM | POA: Insufficient documentation

## 2024-06-06 DIAGNOSIS — I10 Essential (primary) hypertension: Secondary | ICD-10-CM | POA: Insufficient documentation

## 2024-06-06 DIAGNOSIS — T380X5A Adverse effect of glucocorticoids and synthetic analogues, initial encounter: Secondary | ICD-10-CM | POA: Insufficient documentation

## 2024-06-06 DIAGNOSIS — Z79899 Other long term (current) drug therapy: Secondary | ICD-10-CM | POA: Insufficient documentation

## 2024-06-06 DIAGNOSIS — T887XXA Unspecified adverse effect of drug or medicament, initial encounter: Secondary | ICD-10-CM

## 2024-06-06 DIAGNOSIS — J45909 Unspecified asthma, uncomplicated: Secondary | ICD-10-CM | POA: Insufficient documentation

## 2024-06-06 LAB — COMPREHENSIVE METABOLIC PANEL WITH GFR
ALT: 44 U/L (ref 0–44)
AST: 42 U/L — ABNORMAL HIGH (ref 15–41)
Albumin: 3.8 g/dL (ref 3.5–5.0)
Alkaline Phosphatase: 95 U/L (ref 38–126)
Anion gap: 10 (ref 5–15)
BUN: 18 mg/dL (ref 6–20)
CO2: 21 mmol/L — ABNORMAL LOW (ref 22–32)
Calcium: 9.3 mg/dL (ref 8.9–10.3)
Chloride: 107 mmol/L (ref 98–111)
Creatinine, Ser: 0.91 mg/dL (ref 0.44–1.00)
GFR, Estimated: >60 mL/min (ref >60)
Glucose, Bld: 143 mg/dL — ABNORMAL HIGH (ref 70–99)
Potassium: 3.7 mmol/L (ref 3.5–5.1)
Sodium: 138 mmol/L (ref 135–145)
Total Bilirubin: 0.8 mg/dL (ref 0.0–1.2)
Total Protein: 7.2 g/dL (ref 6.5–8.1)

## 2024-06-06 LAB — BLOOD GAS, VENOUS
Acid-Base Excess: 1.2 mmol/L (ref 0.0–2.0)
Bicarbonate: 22.9 mmol/L (ref 20.0–28.0)
O2 Saturation: 83.2 %
Patient temperature: 36.1
pCO2, Ven: 27 mmHg — ABNORMAL LOW (ref 44–60)
pH, Ven: 7.53 — ABNORMAL HIGH (ref 7.25–7.43)
pO2, Ven: 45 mmHg (ref 32–45)

## 2024-06-06 LAB — CBC WITH DIFFERENTIAL/PLATELET
Abs Immature Granulocytes: 0.09 K/uL — ABNORMAL HIGH (ref 0.00–0.07)
Basophils Absolute: 0 K/uL (ref 0.0–0.1)
Basophils Relative: 0 %
Eosinophils Absolute: 0.3 K/uL (ref 0.0–0.5)
Eosinophils Relative: 2 %
HCT: 42.8 % (ref 36.0–46.0)
Hemoglobin: 13.4 g/dL (ref 12.0–15.0)
Immature Granulocytes: 1 %
Lymphocytes Relative: 18 %
Lymphs Abs: 2.7 K/uL (ref 0.7–4.0)
MCH: 24.1 pg — ABNORMAL LOW (ref 26.0–34.0)
MCHC: 31.3 g/dL (ref 30.0–36.0)
MCV: 76.8 fL — ABNORMAL LOW (ref 80.0–100.0)
Monocytes Absolute: 0.9 K/uL (ref 0.1–1.0)
Monocytes Relative: 6 %
Neutro Abs: 11 K/uL — ABNORMAL HIGH (ref 1.7–7.7)
Neutrophils Relative %: 73 %
Platelets: 354 K/uL (ref 150–400)
RBC: 5.57 MIL/uL — ABNORMAL HIGH (ref 3.87–5.11)
RDW: 17.4 % — ABNORMAL HIGH (ref 11.5–15.5)
WBC: 14.9 K/uL — ABNORMAL HIGH (ref 4.0–10.5)
nRBC: 0 % (ref 0.0–0.2)

## 2024-06-06 LAB — RESP PANEL BY RT-PCR (RSV, FLU A&B, COVID)  RVPGX2
Influenza A by PCR: NEGATIVE
Influenza B by PCR: NEGATIVE
Resp Syncytial Virus by PCR: NEGATIVE
SARS Coronavirus 2 by RT PCR: NEGATIVE

## 2024-06-06 MED ORDER — SODIUM CHLORIDE 0.9 % IV BOLUS
1000.0000 mL | Freq: Once | INTRAVENOUS | Status: AC
  Administered 2024-06-06: 1000 mL via INTRAVENOUS

## 2024-06-06 MED ORDER — DIAZEPAM 5 MG/ML IJ SOLN
5.0000 mg | Freq: Once | INTRAMUSCULAR | Status: AC
  Administered 2024-06-06: 5 mg via INTRAVENOUS
  Filled 2024-06-06: qty 2

## 2024-06-06 MED ORDER — ALPRAZOLAM 0.5 MG PO TABS
0.5000 mg | ORAL_TABLET | Freq: Three times a day (TID) | ORAL | 0 refills | Status: DC | PRN
Start: 2024-06-06 — End: 2024-06-15

## 2024-06-06 MED ORDER — ALBUTEROL SULFATE HFA 108 (90 BASE) MCG/ACT IN AERS: 1.0000 | INHALATION_SPRAY | Freq: Four times a day (QID) | RESPIRATORY_TRACT | 0 refills | Status: DC | PRN

## 2024-06-06 MED ORDER — LORAZEPAM 2 MG/ML IJ SOLN
1.0000 mg | Freq: Once | INTRAMUSCULAR | Status: AC
  Administered 2024-06-06: 1 mg via INTRAVENOUS
  Filled 2024-06-06: qty 1

## 2024-06-06 NOTE — Discharge Instructions (Addendum)
 As we discussed, you likely have panic attack from taking too much albuterol   You can use albuterol  2 puffs every 4 hours as needed.  Please do not use more frequently than that  You can take Xanax  0.5 mg 3 times daily as needed  See your doctor for follow-up  Return to ER if you have worse shortness of breath or chest pain or trouble breathing

## 2024-06-06 NOTE — ED Provider Notes (Signed)
 [name] Provider Note   CSN: [id number] Arrival date & time: 06/06/24  1540     Patient presents with: Shortness of Breath   [name] is a 58 y.o. female history of asthma, hypertension, anxiety here presenting with shortness of breath.  Patient was recently seen here for asthma exacerbation.  Patient was given steroids and put on prednisone .  She also has been using albuterol  very frequently today.  She states that she is feeling very jittery and feels that she is dying.  She states that she is unable to take her Adderall because she is so anxious.  She denies any thoughts of harming herself or others.   The history is provided by the patient.       Prior to Admission medications   Medication Sig Start Date End Date Taking? Authorizing Provider  albuterol  (VENTOLIN  HFA) 108 (90 Base) MCG/ACT inhaler Inhale 1-2 puffs into the lungs every 6 (six) hours as needed for wheezing or shortness of breath. 05/31/24   Rancour, Garnette, MD  ALPRAZolam  (XANAX ) 0.5 MG tablet Take 1 tablet (0.5 mg total) by mouth 3 (three) times daily as needed for anxiety. 06/19/22   Burchette, Wolm ORN, MD  amLODipine  (NORVASC ) 10 MG tablet Take 1 tablet (10 mg total) by mouth daily. 04/19/24   Raford Lenis, MD  amphetamine -dextroamphetamine  (ADDERALL XR) 25 MG 24 hr capsule Take 25 mg by mouth 2 (two) times daily as needed (work). 02/13/22   [provider]  Ascorbic Acid (VITAMIN C) 100 MG tablet Take 100 mg by mouth daily.    [provider]  escitalopram  (LEXAPRO ) 10 MG tablet TAKE 1 TABLET BY MOUTH DAILY **MUST CALL MD FOR APPOINTMENT 01/08/24   Burchette, Wolm ORN, MD  esomeprazole (NEXIUM) 40 MG capsule Take 40 mg by mouth daily at 12 noon.    [provider]  Ferrous Sulfate  (IRON PO) Take by mouth.    [provider]  ferrous sulfate  325 (65 FE) MG tablet Take 1 tablet (325 mg total) by mouth 2 (two) times daily with a  meal. 11/06/21   Jillian Buttery, MD  losartan  (COZAAR ) 100 MG tablet Take 1 tablet (100 mg total) by mouth daily. 04/19/24   Raford Lenis, MD  predniSONE  (DELTASONE ) 50 MG tablet 1 tablet PO 05/31/24   Rancour, Garnette, MD  triamcinolone  cream (KENALOG ) 0.1 % Apply 1 application. topically 2 (two) times daily. 01/21/22   Burchette, Wolm ORN, MD  losartan -hydrochlorothiazide  (HYZAAR) 100-12.5 MG tablet TAKE 1 TABLET BY MOUTH ONCE DAILY Patient not taking: Reported on 01/18/2021 02/26/19 03/11/21  Micheal Wolm ORN, MD    Allergies: Aspirin, Codeine sulfate, and Hydrocodone -acetaminophen     Review of Systems  Respiratory:  Positive for shortness of breath.   All other systems reviewed and are negative.   Updated Vital Signs BP (!) 163/101 (BP Location: Left Arm)   Pulse (!) 107   Temp 98.1 F (36.7 C) (Oral)   Resp 20   Wt 81.6 kg   SpO2 99%   BMI 29.05 kg/m   Physical Exam Vitals and nursing note reviewed.  Constitutional:      Comments: Anxious  HENT:     Head: Normocephalic.  Eyes:     Extraocular Movements: Extraocular movements intact.     Pupils: Pupils are equal, round, and reactive to light.  Cardiovascular:     Rate and Rhythm: Regular rhythm. Tachycardia present.  Pulmonary:     Comments: Tachypneic but  no wheezing.  No crackles Musculoskeletal:     Cervical back: Normal range of motion and neck supple.  Skin:    General: Skin is warm.     Capillary Refill: Capillary refill takes less than 2 seconds.  Neurological:     General: No focal deficit present.     Mental Status: She is oriented to person, place, and time.  Psychiatric:        Mood and Affect: Mood normal.        Behavior: Behavior normal.     (all labs ordered are listed, but only abnormal results are displayed) Labs Reviewed  RESP PANEL BY RT-PCR (RSV, FLU A&B, COVID)  [id number]  CBC WITH DIFFERENTIAL/PLATELET  COMPREHENSIVE METABOLIC PANEL WITH GFR  BLOOD GAS, VENOUS     EKG: None  Radiology: No results found.   Procedures   Medications Ordered in the ED  LORazepam  (ATIVAN ) injection 1 mg (has no administration in time range)  sodium chloride  0.9 % bolus 1,000 mL (has no administration in time range)                                    Medical Decision Making LANDA Blake is a 58 y.o. female here presenting with shortness of breath.  I think patient likely has a panic attack.  Her lungs are clear I do not think she has asthma exacerbation.  Will get CBC and BMP and VBG and chest x-ray.  Will give Ativan  and reassess   6:46 PM Patient's labs and white blood cell count is 14 likely from steroid use.  COVID and flu and RSV negative and chest x-ray showed no pneumonia.  Patient is feeling better after Ativan  and Valium .  She ran out of albuterol  and requested refill of albuterol .  I told her that she should use it no more often than 2 puffs every 4 hours.  I will also prescribe Xanax  as needed for anxiety  Problems Addressed: Medication side effect: acute illness or injury Panic attack: acute illness or injury  Amount and/or Complexity of Data Reviewed Labs: ordered. Decision-making details documented in ED Course. Radiology: ordered and independent interpretation performed. Decision-making details documented in ED Course.  Risk Prescription drug management.     Final diagnoses:  None    ED Discharge Orders     None          Patt Alm Macho, MD 06/06/24 [phone]

## 2024-06-06 NOTE — ED Triage Notes (Signed)
 Here by POV from home for sob. Has been using albuterol  a lot. LS CTA. Pt shaky, tremulous, anxious and panicky. Endorses panic and sob. Onset yesterday. Denies cough, fever, pain, NVD, or syncope. Did not take her adderrall b/c she was so shaky. Recently started Lexapro . Keeps asking if she is dying. VSS, SPo2 100% RA. Reassured, coached on breathing.

## 2024-06-10 ENCOUNTER — Ambulatory Visit: Payer: Self-pay | Admitting: *Deleted

## 2024-06-10 NOTE — Telephone Encounter (Signed)
 FYI Only or Action Required?: FYI only for provider.  Patient was last seen in primary care on 06/19/2022 by Micheal Wolm ORN, MD.  Called Nurse Triage reporting Asthma.  Symptoms began several weeks ago.  Interventions attempted: Prescription medications: Prescribed inhalers, just finished prednisone  treatment.  Symptoms are: completely resolved.  Triage Disposition: See PCP Within 2 Weeks  Patient/caregiver understands and will follow disposition?: yes   Reason for Disposition  No asthma check-up in > 6 months  Answer Assessment - Initial Assessment Questions 1. RESPIRATORY STATUS: Describe your breathing? (e.g., wheezing, shortness of breath, unable to speak, severe coughing)      Patient reports she is fine now- she is not control- she just finished prednisone  treatment 2. ONSET: When did this asthma attack begin?      Last few weeks patient has had several problems- had to be seen at ED 3. TRIGGER: What do you think triggered this attack? (e.g., URI, exposure to pollen or other allergen, tobacco smoke)      Neighbors were doing renovations  4. PEAK EXPIRATORY FLOW RATE (PEFR): Do you use a peak flow meter? If Yes, ask: What's the current peak flow? What's your personal best peak flow?      na 5. SEVERITY: How bad is this attack?      Patient has had several ED visits 6. ASTHMA MEDICINES:  What treatments have you tried?      No changes- D dimer +- CT scan was done- normal- sinus showed problems 7. INHALED QUICK-RELIEF TREATMENTS FOR THIS ATTACK: What treatments have you given yourself so far? and How many and how often? If using an inhaler, ask, How many puffs? Note: Routine treatments are 2 puffs every 4 hours as needed. Rescue treatments are 4 puffs repeated every 20 minutes, up to three times as needed.      Patient has prescribed inhaler and is doing well now. Requesting appointment with provider.  Protocols used: Asthma Attack-A-AH   Copied from  CRM #[id number]. Topic: Clinical - Red Word Triage >> Jun 10, 2024  3:48 PM Jayma L wrote: Red Word that prompted transfer to Nurse Triage: patient called in asking for a appt, said she's having new or worsening issues with her asthma. Said she's having shortness of breath and issues with her allergies.

## 2024-06-15 ENCOUNTER — Encounter: Payer: Self-pay | Admitting: Family Medicine

## 2024-06-15 ENCOUNTER — Ambulatory Visit (INDEPENDENT_AMBULATORY_CARE_PROVIDER_SITE_OTHER): Payer: Self-pay | Admitting: Family Medicine

## 2024-06-15 VITALS — BP 148/78 | HR 99 | Temp 98.4°F | Wt 196.5 lb

## 2024-06-15 DIAGNOSIS — J45998 Other asthma: Secondary | ICD-10-CM

## 2024-06-15 DIAGNOSIS — I10 Essential (primary) hypertension: Secondary | ICD-10-CM

## 2024-06-15 MED ORDER — BUDESONIDE-FORMOTEROL FUMARATE 80-4.5 MCG/ACT IN AERO
2.0000 | INHALATION_SPRAY | Freq: Two times a day (BID) | RESPIRATORY_TRACT | 5 refills | Status: AC
Start: 2024-06-15 — End: ?

## 2024-06-15 NOTE — Progress Notes (Signed)
 Established Patient Office Visit  Subjective   Patient ID: [name], female    DOB: [birth year]  Age: 58 y.o. MRN: [id number]  No chief complaint on file.   HPI   Reveca has history of hypertension, asthma, GERD, chronic anxiety and depression, attention deficit disorder.  She is seen today following multiple recent ER visits for wheezing and shortness of breath.  She was seen June 15 with exacerbation and then returned again July 28 with increased shortness of breath.  She had slightly elevated D-dimer at that visit and CT angiogram revealed no pulmonary embolus.  No pneumonia.  She was treated with prednisone  50 mg daily and just finished about a week ago.  She states that she has neighbors that are taken down about 50 trees and she thinks that may have exacerbated some of her issues this summer.  She had significant amount of nasal congestion prior to onset of cough and wheezing with prior exacerbations.  She was then seen again a third time in the ER on 3 August and felt to have probable panic attack.  At that point no wheezing was noted.  She has had very stressful year.  Her father died of brain cancer earlier this year.  She does have history of recurrent depression and anxiety and is followed by psychiatry and they just recently recommended starting Viibryd which she plans to pick up later today.  She has no suicidal ideation at this time.  She made some recent positive lifestyle changes with walking 30 minutes/day and dietary changes.  She states she is already lost some weight.  Hopes to lose considerable amount more.  Longstanding history of hypertension.  She states she is currently taking her amlodipine  10 mg and losartan  100 mg daily regularly.  She feels like her blood pressure is up today because of anxiety.  She is currently without insurance but hopes to have coverage by September 1.  Past Medical History:  Diagnosis Date   Asthma    Blood transfusion     Depression    GERD (gastroesophageal reflux disease)    Hepatitis A    Hypertension    Jaundice    Migraines    Ovarian cyst    UTI (urinary tract infection)    Past Surgical History:  Procedure Laterality Date   APPENDECTOMY  1994   OPEN REDUCTION INTERNAL FIXATION (ORIF) METACARPAL Right 08/15/2016   Procedure: OPEN REDUCTION INTERNAL FIXATION (ORIF) right ring finger middle phalanx fracture;  Surgeon: Franky Curia, MD;  Location: [facility];  Service: Orthopedics;  Laterality: Right;   OVARIAN CYST REMOVAL     TONSILLECTOMY AND ADENOIDECTOMY      reports that she has never smoked. She has never used smokeless tobacco. She reports that she does not currently use alcohol . She reports that she does not use drugs. family history includes Asthma in her son; Breast cancer in her maternal grandmother; Crohn's disease in her father and maternal aunt; Esophageal cancer in her maternal uncle; Hypertension in an other family member. Allergies  Allergen Reactions   Aspirin Shortness Of Breath     wheezing problems   Codeine Sulfate Hives, Itching, Nausea And Vomiting and Rash   Hydrocodone -Acetaminophen  Itching and Rash    Review of Systems  Constitutional:  Negative for chills, fever and malaise/fatigue.  Eyes:  Negative for blurred vision.  Respiratory:  Positive for wheezing. Negative for hemoptysis.   Cardiovascular:  Negative for chest pain.  Neurological:  Negative for  dizziness, weakness and headaches.      Objective:     BP (!) 148/78 (BP Location: Left Arm, Cuff Size: Normal)   Pulse 99   Temp 98.4 F (36.9 C) (Oral)   Wt 196 lb 8 oz (89.1 kg)   SpO2 96%   BMI 31.72 kg/m  BP Readings from Last 3 Encounters:  06/15/24 (!) 148/78  06/06/24 131/67  05/31/24 (!) 166/87   Wt Readings from Last 3 Encounters:  06/15/24 196 lb 8 oz (89.1 kg)  06/06/24 180 lb (81.6 kg)  04/18/24 180 lb (81.6 kg)      Physical Exam Vitals reviewed.  Constitutional:       General: She is not in acute distress.    Appearance: She is not ill-appearing.  Cardiovascular:     Rate and Rhythm: Normal rate and regular rhythm.  Pulmonary:     Comments: Few faint expiratory wheezes.  No rales.  O2 sat 96% room air Musculoskeletal:     Right lower leg: No edema.     Left lower leg: No edema.  Neurological:     Mental Status: She is alert.      No results found for any visits on 06/15/24.    The ASCVD Risk score (Arnett DK, et al., 2019) failed to calculate for the following reasons:   Cannot find a previous HDL lab   Cannot find a previous total cholesterol lab    Assessment & Plan:   #1 persistent asthma with multiple recent ER visits.  Recommend starting Symbicort  80 mg 2 puffs twice daily and rinse mouth after use.  Strongly recommend flu vaccine this fall.  Continue weight loss efforts.  Set up 53-month follow-up to reassess  #2 hypertension.  Considerable elevation initially here but came down a lot after rest.  She feels like this is anxiety related.  Recommend close home monitoring and be in touch with consistent readings over 140/90.  Continue weight loss efforts.  Continue low-sodium diet.  Continue amlodipine  and losartan .  Wolm Scarlet, MD

## 2024-07-21 ENCOUNTER — Other Ambulatory Visit: Payer: Self-pay | Admitting: Family Medicine

## 2024-07-21 DIAGNOSIS — I10 Essential (primary) hypertension: Secondary | ICD-10-CM

## 2024-07-21 MED ORDER — LOSARTAN POTASSIUM 100 MG PO TABS
100.0000 mg | ORAL_TABLET | Freq: Every day | ORAL | 3 refills | Status: DC
Start: 2024-07-21 — End: 2024-09-13

## 2024-07-21 MED ORDER — ALBUTEROL SULFATE HFA 108 (90 BASE) MCG/ACT IN AERS: 1.0000 | INHALATION_SPRAY | Freq: Four times a day (QID) | RESPIRATORY_TRACT | 0 refills | Status: DC | PRN

## 2024-07-21 NOTE — Telephone Encounter (Signed)
 Copied from CRM [id number]. Topic: Clinical - Medication Refill >> Jul 21, 2024  1:18 PM Shardie S wrote: Medication: albuterol  (VENTOLIN  HFA) 108 (90 Base) MCG/ACT inhaler  Has the patient contacted their pharmacy? No (Agent: If no, request that the patient contact the pharmacy for the refill. If patient does not wish to contact the pharmacy document the reason why and proceed with request.) (Agent: If yes, when and what did the pharmacy advise?)  This is the patient's preferred pharmacy:  [facility] [id number] [place], [place] - [address] [place] [address] Phone: [phone] Fax: [phone]  Is this the correct pharmacy for this prescription? Yes If no, delete pharmacy and type the correct one.   Has the prescription been filled recently? No  Is the patient out of the medication? Yes  Has the patient been seen for an appointment in the last year OR does the patient have an upcoming appointment? Yes  Can we respond through MyChart? Yes  Agent: Please be advised that Rx refills may take up to 3 business days. We ask that you follow-up with your pharmacy.

## 2024-08-05 ENCOUNTER — Other Ambulatory Visit: Payer: Self-pay | Admitting: Family Medicine

## 2024-08-05 DIAGNOSIS — F411 Generalized anxiety disorder: Secondary | ICD-10-CM

## 2024-09-07 ENCOUNTER — Other Ambulatory Visit: Payer: Self-pay | Admitting: Family Medicine

## 2024-09-13 ENCOUNTER — Other Ambulatory Visit: Payer: Self-pay

## 2024-09-13 DIAGNOSIS — I10 Essential (primary) hypertension: Secondary | ICD-10-CM

## 2024-09-13 MED ORDER — LOSARTAN POTASSIUM 100 MG PO TABS: 100.0000 mg | ORAL_TABLET | Freq: Every day | ORAL | 3 refills | Status: AC

## 2024-09-14 MED ORDER — AMLODIPINE BESYLATE 10 MG PO TABS: 10.0000 mg | ORAL_TABLET | Freq: Every day | ORAL | 1 refills | Status: AC

## 2024-10-05 ENCOUNTER — Other Ambulatory Visit: Payer: Self-pay | Admitting: Family Medicine

## 2024-10-05 DIAGNOSIS — F411 Generalized anxiety disorder: Secondary | ICD-10-CM

## 2024-10-31 ENCOUNTER — Other Ambulatory Visit: Payer: Self-pay | Admitting: Family Medicine

## 2024-10-31 DIAGNOSIS — F411 Generalized anxiety disorder: Secondary | ICD-10-CM
